# Patient Record
Sex: Female | Born: 1975 | Race: White | Hispanic: No | Smoking: Current every day smoker
Health system: Southern US, Community
[De-identification: ages and names within clinical notes are randomized; demographics above are authoritative.]

## PROBLEM LIST (undated history)

## (undated) ENCOUNTER — Emergency Department (HOSPITAL_BASED_OUTPATIENT_CLINIC_OR_DEPARTMENT_OTHER): Admission: EM | Payer: Medicare Other | Source: Home / Self Care

## (undated) DIAGNOSIS — M869 Osteomyelitis, unspecified: Secondary | ICD-10-CM

## (undated) DIAGNOSIS — F319 Bipolar disorder, unspecified: Secondary | ICD-10-CM

## (undated) DIAGNOSIS — H5440 Blindness, one eye, unspecified eye: Secondary | ICD-10-CM

## (undated) DIAGNOSIS — B182 Chronic viral hepatitis C: Secondary | ICD-10-CM

## (undated) DIAGNOSIS — F1990 Other psychoactive substance use, unspecified, uncomplicated: Secondary | ICD-10-CM

## (undated) DIAGNOSIS — F329 Major depressive disorder, single episode, unspecified: Secondary | ICD-10-CM

## (undated) DIAGNOSIS — R569 Unspecified convulsions: Secondary | ICD-10-CM

## (undated) DIAGNOSIS — D649 Anemia, unspecified: Secondary | ICD-10-CM

## (undated) DIAGNOSIS — I509 Heart failure, unspecified: Secondary | ICD-10-CM

## (undated) DIAGNOSIS — F32A Depression, unspecified: Secondary | ICD-10-CM

## (undated) DIAGNOSIS — M86661 Other chronic osteomyelitis, right tibia and fibula: Secondary | ICD-10-CM

## (undated) DIAGNOSIS — T8859XA Other complications of anesthesia, initial encounter: Secondary | ICD-10-CM

## (undated) DIAGNOSIS — T4145XA Adverse effect of unspecified anesthetic, initial encounter: Secondary | ICD-10-CM

## (undated) HISTORY — PX: FRACTURE SURGERY: SHX138

---

## 1998-12-12 ENCOUNTER — Encounter: Payer: Self-pay | Admitting: Emergency Medicine

## 1998-12-12 ENCOUNTER — Emergency Department (HOSPITAL_COMMUNITY): Admission: EM | Admit: 1998-12-12 | Discharge: 1998-12-12 | Payer: Self-pay | Admitting: Emergency Medicine

## 2000-08-11 ENCOUNTER — Encounter: Admission: RE | Admit: 2000-08-11 | Discharge: 2000-08-11 | Payer: Self-pay | Admitting: Family Medicine

## 2000-08-11 ENCOUNTER — Encounter: Payer: Self-pay | Admitting: Family Medicine

## 2000-10-22 ENCOUNTER — Encounter: Payer: Self-pay | Admitting: Family Medicine

## 2000-10-22 ENCOUNTER — Ambulatory Visit (HOSPITAL_COMMUNITY): Admission: RE | Admit: 2000-10-22 | Discharge: 2000-10-22 | Payer: Self-pay | Admitting: Family Medicine

## 2000-11-03 ENCOUNTER — Encounter (HOSPITAL_COMMUNITY): Admission: RE | Admit: 2000-11-03 | Discharge: 2000-12-03 | Payer: Self-pay | Admitting: *Deleted

## 2000-11-26 ENCOUNTER — Encounter: Admission: RE | Admit: 2000-11-26 | Discharge: 2000-11-26 | Payer: Self-pay | Admitting: Obstetrics

## 2000-12-13 ENCOUNTER — Inpatient Hospital Stay (HOSPITAL_COMMUNITY): Admission: AD | Admit: 2000-12-13 | Discharge: 2000-12-13 | Payer: Self-pay | Admitting: Obstetrics & Gynecology

## 2000-12-14 ENCOUNTER — Ambulatory Visit (HOSPITAL_COMMUNITY): Admission: RE | Admit: 2000-12-14 | Discharge: 2000-12-14 | Payer: Self-pay | Admitting: Obstetrics

## 2000-12-23 ENCOUNTER — Encounter: Admission: RE | Admit: 2000-12-23 | Discharge: 2000-12-23 | Payer: Self-pay | Admitting: Obstetrics & Gynecology

## 2000-12-23 ENCOUNTER — Inpatient Hospital Stay (HOSPITAL_COMMUNITY): Admission: AD | Admit: 2000-12-23 | Discharge: 2000-12-23 | Payer: Self-pay | Admitting: Obstetrics

## 2000-12-23 ENCOUNTER — Encounter: Payer: Self-pay | Admitting: Obstetrics

## 2000-12-25 ENCOUNTER — Inpatient Hospital Stay (HOSPITAL_COMMUNITY): Admission: AD | Admit: 2000-12-25 | Discharge: 2000-12-25 | Payer: Self-pay | Admitting: *Deleted

## 2000-12-25 ENCOUNTER — Ambulatory Visit (HOSPITAL_COMMUNITY): Admission: RE | Admit: 2000-12-25 | Discharge: 2000-12-25 | Payer: Self-pay | Admitting: *Deleted

## 2000-12-28 ENCOUNTER — Inpatient Hospital Stay (HOSPITAL_COMMUNITY): Admission: AD | Admit: 2000-12-28 | Discharge: 2000-12-28 | Payer: Self-pay | Admitting: *Deleted

## 2000-12-29 ENCOUNTER — Inpatient Hospital Stay (HOSPITAL_COMMUNITY): Admission: AD | Admit: 2000-12-29 | Discharge: 2001-01-01 | Payer: Self-pay | Admitting: *Deleted

## 2000-12-29 ENCOUNTER — Encounter (INDEPENDENT_AMBULATORY_CARE_PROVIDER_SITE_OTHER): Payer: Self-pay

## 2001-01-01 HISTORY — PX: TUBAL LIGATION: SHX77

## 2001-01-04 ENCOUNTER — Inpatient Hospital Stay (HOSPITAL_COMMUNITY): Admission: AD | Admit: 2001-01-04 | Discharge: 2001-01-04 | Payer: Self-pay | Admitting: Obstetrics

## 2002-05-10 ENCOUNTER — Encounter: Payer: Self-pay | Admitting: Emergency Medicine

## 2002-05-10 ENCOUNTER — Emergency Department (HOSPITAL_COMMUNITY): Admission: EM | Admit: 2002-05-10 | Discharge: 2002-05-11 | Payer: Self-pay | Admitting: Emergency Medicine

## 2002-12-21 ENCOUNTER — Emergency Department (HOSPITAL_COMMUNITY): Admission: EM | Admit: 2002-12-21 | Discharge: 2002-12-21 | Payer: Self-pay | Admitting: Emergency Medicine

## 2003-02-09 ENCOUNTER — Encounter: Payer: Self-pay | Admitting: *Deleted

## 2003-02-09 ENCOUNTER — Emergency Department (HOSPITAL_COMMUNITY): Admission: EM | Admit: 2003-02-09 | Discharge: 2003-02-09 | Payer: Self-pay | Admitting: *Deleted

## 2003-05-02 ENCOUNTER — Inpatient Hospital Stay (HOSPITAL_COMMUNITY): Admission: EM | Admit: 2003-05-02 | Discharge: 2003-05-03 | Payer: Self-pay

## 2003-06-16 ENCOUNTER — Inpatient Hospital Stay (HOSPITAL_COMMUNITY): Admission: EM | Admit: 2003-06-16 | Discharge: 2003-07-06 | Payer: Self-pay | Admitting: Emergency Medicine

## 2003-06-18 HISTORY — PX: OTHER SURGICAL HISTORY: SHX169

## 2003-06-20 ENCOUNTER — Encounter (INDEPENDENT_AMBULATORY_CARE_PROVIDER_SITE_OTHER): Payer: Self-pay | Admitting: Cardiology

## 2003-06-29 ENCOUNTER — Encounter: Payer: Self-pay | Admitting: Internal Medicine

## 2004-07-02 ENCOUNTER — Ambulatory Visit: Payer: Self-pay | Admitting: Gastroenterology

## 2004-07-12 ENCOUNTER — Ambulatory Visit (HOSPITAL_COMMUNITY): Admission: RE | Admit: 2004-07-12 | Discharge: 2004-07-12 | Payer: Self-pay | Admitting: Gastroenterology

## 2004-07-12 ENCOUNTER — Encounter (INDEPENDENT_AMBULATORY_CARE_PROVIDER_SITE_OTHER): Payer: Self-pay | Admitting: Specialist

## 2004-09-30 ENCOUNTER — Ambulatory Visit: Payer: Self-pay | Admitting: Internal Medicine

## 2004-10-14 ENCOUNTER — Ambulatory Visit: Payer: Self-pay | Admitting: Gastroenterology

## 2004-10-31 ENCOUNTER — Ambulatory Visit: Payer: Self-pay | Admitting: Gastroenterology

## 2004-11-14 ENCOUNTER — Ambulatory Visit: Payer: Self-pay | Admitting: Internal Medicine

## 2004-11-28 ENCOUNTER — Ambulatory Visit: Payer: Self-pay | Admitting: Internal Medicine

## 2004-12-12 ENCOUNTER — Ambulatory Visit: Payer: Self-pay | Admitting: Internal Medicine

## 2005-01-09 ENCOUNTER — Ambulatory Visit: Payer: Self-pay | Admitting: Internal Medicine

## 2005-02-06 ENCOUNTER — Ambulatory Visit: Payer: Self-pay | Admitting: Gastroenterology

## 2005-03-06 ENCOUNTER — Encounter: Admission: RE | Admit: 2005-03-06 | Discharge: 2005-03-06 | Payer: Self-pay | Admitting: Obstetrics and Gynecology

## 2005-03-06 ENCOUNTER — Ambulatory Visit: Payer: Self-pay | Admitting: Gastroenterology

## 2005-03-13 ENCOUNTER — Ambulatory Visit: Payer: Self-pay | Admitting: Gastroenterology

## 2005-03-16 ENCOUNTER — Emergency Department: Payer: Self-pay | Admitting: Emergency Medicine

## 2005-04-10 ENCOUNTER — Ambulatory Visit: Payer: Self-pay | Admitting: Gastroenterology

## 2005-05-08 ENCOUNTER — Ambulatory Visit: Payer: Self-pay | Admitting: Gastroenterology

## 2005-06-19 ENCOUNTER — Ambulatory Visit: Payer: Self-pay | Admitting: Gastroenterology

## 2005-08-01 ENCOUNTER — Encounter: Admission: RE | Admit: 2005-08-01 | Discharge: 2005-08-01 | Payer: Self-pay | Admitting: Gastroenterology

## 2005-08-01 ENCOUNTER — Ambulatory Visit: Payer: Self-pay | Admitting: Gastroenterology

## 2005-08-28 ENCOUNTER — Ambulatory Visit: Payer: Self-pay | Admitting: Gastroenterology

## 2005-10-30 ENCOUNTER — Ambulatory Visit: Payer: Self-pay | Admitting: Gastroenterology

## 2006-03-05 ENCOUNTER — Ambulatory Visit: Payer: Self-pay | Admitting: Gastroenterology

## 2008-06-29 ENCOUNTER — Emergency Department (HOSPITAL_COMMUNITY): Admission: EM | Admit: 2008-06-29 | Discharge: 2008-06-29 | Payer: Self-pay | Admitting: Emergency Medicine

## 2008-10-22 ENCOUNTER — Emergency Department (HOSPITAL_BASED_OUTPATIENT_CLINIC_OR_DEPARTMENT_OTHER): Admission: EM | Admit: 2008-10-22 | Discharge: 2008-10-22 | Payer: Self-pay | Admitting: Emergency Medicine

## 2008-10-22 ENCOUNTER — Ambulatory Visit: Payer: Self-pay | Admitting: Diagnostic Radiology

## 2010-02-19 ENCOUNTER — Ambulatory Visit (HOSPITAL_COMMUNITY): Admission: RE | Admit: 2010-02-19 | Discharge: 2010-02-19 | Payer: Self-pay | Admitting: Psychiatry

## 2010-02-20 ENCOUNTER — Other Ambulatory Visit (HOSPITAL_COMMUNITY): Admission: RE | Admit: 2010-02-20 | Discharge: 2010-05-01 | Payer: Self-pay | Admitting: Psychiatry

## 2010-02-22 ENCOUNTER — Ambulatory Visit: Payer: Self-pay | Admitting: Psychiatry

## 2010-05-09 ENCOUNTER — Ambulatory Visit (HOSPITAL_COMMUNITY): Payer: Self-pay | Admitting: Psychology

## 2010-09-05 ENCOUNTER — Inpatient Hospital Stay (HOSPITAL_COMMUNITY)
Admission: RE | Admit: 2010-09-05 | Discharge: 2010-09-09 | DRG: 897 | Disposition: A | Discharge status: Home / Self Care | Payer: Medicare Other | Source: Ambulatory Visit | Attending: Psychiatry | Admitting: Psychiatry

## 2010-09-05 ENCOUNTER — Emergency Department (HOSPITAL_COMMUNITY)
Admission: EM | Admit: 2010-09-05 | Discharge: 2010-09-05 | Disposition: A | Discharge status: Other Acute Inpatient Hospital | Payer: Medicare Other | Source: Home / Self Care | Attending: Emergency Medicine | Admitting: Emergency Medicine

## 2010-09-05 DIAGNOSIS — Z9119 Patient's noncompliance with other medical treatment and regimen: Secondary | ICD-10-CM

## 2010-09-05 DIAGNOSIS — Z8619 Personal history of other infectious and parasitic diseases: Secondary | ICD-10-CM | POA: Insufficient documentation

## 2010-09-05 DIAGNOSIS — F319 Bipolar disorder, unspecified: Secondary | ICD-10-CM

## 2010-09-05 DIAGNOSIS — L659 Nonscarring hair loss, unspecified: Secondary | ICD-10-CM | POA: Insufficient documentation

## 2010-09-05 DIAGNOSIS — B1920 Unspecified viral hepatitis C without hepatic coma: Secondary | ICD-10-CM

## 2010-09-05 DIAGNOSIS — F39 Unspecified mood [affective] disorder: Secondary | ICD-10-CM

## 2010-09-05 DIAGNOSIS — Z23 Encounter for immunization: Secondary | ICD-10-CM

## 2010-09-05 DIAGNOSIS — R45851 Suicidal ideations: Secondary | ICD-10-CM

## 2010-09-05 DIAGNOSIS — F1994 Other psychoactive substance use, unspecified with psychoactive substance-induced mood disorder: Secondary | ICD-10-CM

## 2010-09-05 DIAGNOSIS — Z91199 Patient's noncompliance with other medical treatment and regimen due to unspecified reason: Secondary | ICD-10-CM

## 2010-09-05 DIAGNOSIS — F411 Generalized anxiety disorder: Secondary | ICD-10-CM | POA: Insufficient documentation

## 2010-09-05 DIAGNOSIS — L905 Scar conditions and fibrosis of skin: Secondary | ICD-10-CM | POA: Insufficient documentation

## 2010-09-05 DIAGNOSIS — Z560 Unemployment, unspecified: Secondary | ICD-10-CM

## 2010-09-05 DIAGNOSIS — D649 Anemia, unspecified: Secondary | ICD-10-CM

## 2010-09-05 DIAGNOSIS — F1920 Other psychoactive substance dependence, uncomplicated: Principal | ICD-10-CM

## 2010-09-05 DIAGNOSIS — Z79899 Other long term (current) drug therapy: Secondary | ICD-10-CM | POA: Insufficient documentation

## 2010-09-05 LAB — DIFFERENTIAL
Basophils Absolute: 0 10*3/uL (ref 0.0–0.1)
Basophils Relative: 0 % (ref 0–1)
Eosinophils Absolute: 0.1 10*3/uL (ref 0.0–0.7)
Eosinophils Relative: 1 % (ref 0–5)
Lymphocytes Relative: 16 % (ref 12–46)
Lymphs Abs: 1.5 10*3/uL (ref 0.7–4.0)
Monocytes Absolute: 0.7 10*3/uL (ref 0.1–1.0)
Monocytes Relative: 7 % (ref 3–12)
Neutro Abs: 7.3 10*3/uL (ref 1.7–7.7)
Neutrophils Relative %: 76 % (ref 43–77)

## 2010-09-05 LAB — COMPREHENSIVE METABOLIC PANEL
ALT: 21 U/L (ref 0–35)
AST: 25 U/L (ref 0–37)
Albumin: 3.6 g/dL (ref 3.5–5.2)
Alkaline Phosphatase: 68 U/L (ref 39–117)
BUN: 8 mg/dL (ref 6–23)
CO2: 26 mEq/L (ref 19–32)
Calcium: 8.5 mg/dL (ref 8.4–10.5)
Chloride: 100 mEq/L (ref 96–112)
Creatinine, Ser: 0.69 mg/dL (ref 0.4–1.2)
GFR calc Af Amer: >60 mL/min (ref >60)
GFR calc non Af Amer: >60 mL/min (ref >60)
Glucose, Bld: 78 mg/dL (ref 70–99)
Potassium: 3 mEq/L — ABNORMAL LOW (ref 3.5–5.1)
Sodium: 133 mEq/L — ABNORMAL LOW (ref 135–145)
Total Bilirubin: 0.5 mg/dL (ref 0.3–1.2)
Total Protein: 7.5 g/dL (ref 6.0–8.3)

## 2010-09-05 LAB — CBC
HCT: 30.5 % — ABNORMAL LOW (ref 36.0–46.0)
Hemoglobin: 9.1 g/dL — ABNORMAL LOW (ref 12.0–15.0)
MCH: 20.7 pg — ABNORMAL LOW (ref 26.0–34.0)
MCHC: 29.8 g/dL — ABNORMAL LOW (ref 30.0–36.0)
MCV: 69.5 fL — ABNORMAL LOW (ref 78.0–100.0)
Platelets: 263 10*3/uL (ref 150–400)
RBC: 4.39 MIL/uL (ref 3.87–5.11)
RDW: 18.7 % — ABNORMAL HIGH (ref 11.5–15.5)
WBC: 9.6 10*3/uL (ref 4.0–10.5)

## 2010-09-05 LAB — RAPID URINE DRUG SCREEN, HOSP PERFORMED
Amphetamines: NOT DETECTED
Barbiturates: NOT DETECTED
Benzodiazepines: POSITIVE — AB
Cocaine: POSITIVE — AB
Opiates: POSITIVE — AB
Tetrahydrocannabinol: NOT DETECTED

## 2010-09-05 LAB — POCT PREGNANCY, URINE: Preg Test, Ur: NEGATIVE

## 2010-09-05 LAB — ETHANOL: Alcohol, Ethyl (B): <5 mg/dL (ref 0–10)

## 2010-09-06 DIAGNOSIS — F1910 Other psychoactive substance abuse, uncomplicated: Secondary | ICD-10-CM

## 2010-09-07 LAB — RPR: RPR Ser Ql: NONREACTIVE

## 2010-09-07 LAB — VITAMIN B12: Vitamin B-12: 290 pg/mL (ref 211–911)

## 2010-09-07 LAB — TSH: TSH: 0.224 u[IU]/mL — ABNORMAL LOW (ref 0.350–4.500)

## 2010-09-09 LAB — FOLATE RBC: RBC Folate: 1091 ng/mL — ABNORMAL HIGH (ref 180–600)

## 2010-09-11 LAB — DRUGS OF ABUSE SCREEN W/O ALC, ROUTINE URINE
Amphetamine Screen, Ur: NEGATIVE
Barbiturate Quant, Ur: NEGATIVE
Benzodiazepines.: NEGATIVE
Cocaine Metabolites: NEGATIVE
Creatinine,U: 64.5 mg/dL
Marijuana Metabolite: NEGATIVE
Methadone: NEGATIVE
Opiate Screen, Urine: NEGATIVE
Phencyclidine (PCP): NEGATIVE
Propoxyphene: NEGATIVE

## 2010-09-11 NOTE — H&P (Signed)
NAME:  Mary Long, [name]:  [id number]  MEDICAL RECORD NO.:  [id number]           PATIENT TYPE:  I  LOCATION:  0304                          [facility]ACILITY[facility]  [facility]  PHYSICIAN:  Anselm Jungling, MD  DATE OF BIRTH:  06/14/1976  DATE OF ADMISSION:  09/05/2010 DATE OF DISCHARGE:                      PSYCHIATRIC ADMISSION ASSESSMENT   This is a 35 year old female who was voluntarily admitted on September 05, 2010. The patient presented to the emergency department from Dr. Dub Mikes, having suicidal thoughts and using heroin intravenously and cocaine.  Has been off her medications for some period of time.  She is asking to be placed on Suboxone.  She denies any psychotic symptoms.  PAST PSYCHIATRIC HISTORY:  The patient was last here in August 2011 for chronic heroin and cocaine dependence,with a long history of bipolar disorder.  MEDICATIONS:  The patient lists medications of Ambien and Geodon, again of which she has been noncompliant with. She has been on BuSpar and lithium in the past.  She sees Dr. Dub Mikes for outpatient mental health therapy.  SOCIAL HISTORY:  The patient lives with her mother and her 37 year old daughter.  She is on disability. The patient is a widowed.  FAMILY HISTORY:  None.  ALCOHOL/DRUG HISTORY:  The patient has been using intravenously.  Denies any alcohol use.  Reported use of heroin and cocaine.  PRIMARY CARE PHYSICIAN:  None.  DRUG ALLERGIES:  None.  PHYSICAL EXAMINATION:  The physical exam was done at [facility] emergency department.  She is very disheveled.  She has some scabs present on her chin and nose.  She states that she has a fall. She seems uncomfortable but denies any physical complaints.  She reports feeling exhausted.  ER records with her physical note that the patient is has pea-sized patch of alopecia on her scalp with scabs and has multiple well-healing scars, cut marks throughout her arms bilaterally.  Her right and left  pupil are noted to be pinpoint.  LABORATORY DATA:  Hemoglobin is 9, hematocrit of 30, MCV 69.5, MCH is 20.7.  Urine pregnancy test is negative.  Sodium 133, potassium of 3. Alcohol level less than 5.  Urine drug screen positive for opiates, positive for cocaine, positive for benzodiazepine.  MENTAL STATUS EXAM:  The patient is in bed.  She is very disheveled. Poor eye contact.  Skin scabs are noted to her chin and to her nose. Her speech is somewhat slurred and difficult to understand at times. She seems irritable.  She states that she is exhausted.  Thought process is are coherent, goal-directed.  No evidence of any psychotic symptoms, irritable when offered that she could have the clonidine protocol, that it will not work asking for Suboxone. Cognitive functioning:  She seems aware of herself and place. Her judgment and insight are poor.  DIAGNOSES:  AXIS I:  Polysubstance dependence.  Substance-induced mood disorder. AXIS II:  Deferred. AXIS III:  Anemia AXIS IV:  Other psychosocial problems related to chronic substance use. AXIS V:  Current is 30.  PLAN:  Our plan is to offer the clonidine protocol for comfort measures. Will continue to assess her  comorbidities.  We will have her Seroquel available for agitation and Ativan for anxiety.  We will assess her motivation for rehab and identify her support group.  Her tentative length of stay at this time is 2-4 days.     Landry Corporal, N.P.   ______________________________ Anselm Jungling, MD    JO/MEDQ  D:  09/06/2010  T:  09/07/2010  Job:  [id number]  Electronically Signed by Limmie PatriciaP. on 09/09/2010 02:05:34 PM Electronically Signed by Geralyn Flash MD on 09/11/2010 12:56:32 PM

## 2010-09-16 NOTE — Discharge Summary (Signed)
  NAME:  [name], [name] NO.:  [id number]  MEDICAL RECORD NO.:  [id number]           PATIENT TYPE:  I  LOCATION:  0304                          [facility]ACILITY[facility]  [facility]  PHYSICIAN:  Anselm Jungling, MD  DATE OF BIRTH:  06-06-1976  DATE OF ADMISSION:  09/05/2010 DATE OF DISCHARGE:  09/09/2010                              DISCHARGE SUMMARY   IDENTIFYING DATA AND REASON FOR ADMISSION:  This was an inpatient psychiatric admission for Mary Long, a 35 year old, single, Caucasian female who was admitted because of polysubstance abuse and dependence. Also, she came to [place] with a history of mood disorder and had been off of her medications for approximately 2 weeks.  Please refer to the admission note for further details pertaining to the symptoms, circumstances, and history that led to her hospitalization.  She was given initial axis I diagnosis of polysubstance dependence and substance- induced mood disorder.  MEDICAL AND LABORATORY:  The patient was medically and physically assessed by the psychiatric nurse practitioner.  She was in good health without any active or chronic medical problems.  There were no significant medical issues during her stay.  HOSPITAL COURSE:  The patient was admitted to the Adult Inpatient Psychiatric Service.  She presented as a well-nourished, normally- developed, adult female who was generally pleasant and cooperative.  She readily acknowledged her loss of control of her substance abuse.  She acknowledged having a lot of legal problems related to her substance abuse as well.  She participated in therapeutic groups and activities geared towards helping her acquire better coping skills, a better understanding of her underlying disorders and dynamics, and the development of a sound aftercare plan.  She also participated in 12-step recovery groups.  She was treated with a psychotropic regimen of medications that she had previously found helpful  including Geodon, BuSpar, and Strattera.  She was instructed to stop taking lithium, Abilify, and Ambien.  She was appropriate for discharge on the fifth hospital day.  She agreed to the following aftercare plan.  AFTERCARE:  The patient was to follow up at the [facility] on February 28 at 11:00 a.m. and with Dr. Dub Mikes on March 1 at 10:40 a.m. She was also given information for getting to the [facility] for any medical followup that she might need.  DISCHARGE MEDICATIONS: 1. Atomoxetine 10 mg 3 capsules by mouth daily. 2. BuSpar 10 mg b.i.d. 3. Geodon 40 mg q.h.s.  DISCHARGE DIAGNOSES:  AXIS I:  Polysubstance dependence, early remission and mood disorder not otherwise specified. AXIS II:  Deferred. AXIS III:  No acute or chronic illnesses. AXIS IV:  Stressors severe. AXIS V:  GAF on discharge 50.     Anselm Jungling, MD     SPB/MEDQ  D:  09/12/2010  T:  09/12/2010  Job:  [id number]  Electronically Signed by Geralyn Flash MD on 09/16/2010 11:04:10 AM

## 2010-09-26 LAB — URINE DRUGS OF ABUSE SCREEN W ALC, ROUTINE (REF LAB)
Amphetamine Screen, Ur: NEGATIVE
Amphetamine Screen, Ur: NEGATIVE
Amphetamine Screen, Ur: NEGATIVE
Barbiturate Quant, Ur: NEGATIVE
Barbiturate Quant, Ur: NEGATIVE
Barbiturate Quant, Ur: NEGATIVE
Barbiturate Quant, Ur: NEGATIVE
Barbiturate Quant, Ur: NEGATIVE
Barbiturate Quant, Ur: NEGATIVE
Barbiturate Quant, Ur: NEGATIVE
Benzodiazepines.: NEGATIVE
Benzodiazepines.: NEGATIVE
Benzodiazepines.: NEGATIVE
Benzodiazepines.: NEGATIVE
Benzodiazepines.: NEGATIVE
Benzodiazepines.: NEGATIVE
Benzodiazepines.: NEGATIVE
Cocaine Metabolites: NEGATIVE
Cocaine Metabolites: NEGATIVE
Cocaine Metabolites: NEGATIVE
Cocaine Metabolites: NEGATIVE
Cocaine Metabolites: NEGATIVE
Cocaine Metabolites: NEGATIVE
Cocaine Metabolites: NEGATIVE
Creatinine,U: 118.9 mg/dL
Creatinine,U: 98.3 mg/dL
Creatinine,U: 78.9 mg/dL
Creatinine,U: 91.2 mg/dL
Creatinine,U: 91.1 mg/dL
Ethyl Alcohol: <10 mg/dL (ref <10)
Ethyl Alcohol: <10 mg/dL (ref <10)
Ethyl Alcohol: <10 mg/dL (ref <10)
Ethyl Alcohol: <10 mg/dL (ref <10)
Ethyl Alcohol: <10 mg/dL (ref <10)
Ethyl Alcohol: <10 mg/dL (ref <10)
Ethyl Alcohol: <10 mg/dL (ref <10)
Marijuana Metabolite: NEGATIVE
Marijuana Metabolite: NEGATIVE
Marijuana Metabolite: NEGATIVE
Methadone: NEGATIVE
Methadone: NEGATIVE
Methadone: NEGATIVE
Methadone: NEGATIVE
Opiate Screen, Urine: NEGATIVE
Opiate Screen, Urine: NEGATIVE
Opiate Screen, Urine: NEGATIVE
Opiate Screen, Urine: NEGATIVE
Phencyclidine (PCP): NEGATIVE
Phencyclidine (PCP): NEGATIVE
Phencyclidine (PCP): NEGATIVE
Phencyclidine (PCP): NEGATIVE
Phencyclidine (PCP): NEGATIVE
Phencyclidine (PCP): NEGATIVE
Phencyclidine (PCP): NEGATIVE
Propoxyphene: NEGATIVE
Propoxyphene: NEGATIVE
Propoxyphene: NEGATIVE

## 2010-09-27 LAB — URINE DRUGS OF ABUSE SCREEN W ALC, ROUTINE (REF LAB)
Amphetamine Screen, Ur: NEGATIVE
Barbiturate Quant, Ur: NEGATIVE
Benzodiazepines.: NEGATIVE
Benzodiazepines.: NEGATIVE
Marijuana Metabolite: NEGATIVE
Marijuana Metabolite: NEGATIVE
Methadone: NEGATIVE
Phencyclidine (PCP): NEGATIVE
Propoxyphene: NEGATIVE
Propoxyphene: NEGATIVE
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
## 2010-11-29 ENCOUNTER — Encounter (HOSPITAL_COMMUNITY): Payer: Self-pay

## 2010-11-29 ENCOUNTER — Emergency Department (HOSPITAL_COMMUNITY): Payer: Medicare Other

## 2010-11-29 ENCOUNTER — Inpatient Hospital Stay (HOSPITAL_COMMUNITY)
Admission: EM | Admit: 2010-11-29 | Discharge: 2010-11-30 | DRG: 958 | Disposition: A | Discharge status: Skilled Nursing Facility | Payer: Medicare Other | Attending: Orthopaedic Surgery | Admitting: Orthopaedic Surgery

## 2010-11-29 DIAGNOSIS — S32509A Unspecified fracture of unspecified pubis, initial encounter for closed fracture: Secondary | ICD-10-CM | POA: Diagnosis present

## 2010-11-29 DIAGNOSIS — S92309A Fracture of unspecified metatarsal bone(s), unspecified foot, initial encounter for closed fracture: Secondary | ICD-10-CM | POA: Diagnosis present

## 2010-11-29 DIAGNOSIS — F4310 Post-traumatic stress disorder, unspecified: Secondary | ICD-10-CM | POA: Diagnosis present

## 2010-11-29 DIAGNOSIS — R000 Tachycardia, unspecified: Secondary | ICD-10-CM | POA: Diagnosis present

## 2010-11-29 DIAGNOSIS — E870 Hyperosmolality and hypernatremia: Secondary | ICD-10-CM | POA: Diagnosis present

## 2010-11-29 DIAGNOSIS — D62 Acute posthemorrhagic anemia: Secondary | ICD-10-CM | POA: Diagnosis present

## 2010-11-29 DIAGNOSIS — D72829 Elevated white blood cell count, unspecified: Secondary | ICD-10-CM | POA: Diagnosis present

## 2010-11-29 DIAGNOSIS — F319 Bipolar disorder, unspecified: Secondary | ICD-10-CM | POA: Diagnosis present

## 2010-11-29 DIAGNOSIS — S82899A Other fracture of unspecified lower leg, initial encounter for closed fracture: Principal | ICD-10-CM | POA: Diagnosis present

## 2010-11-29 DIAGNOSIS — F17200 Nicotine dependence, unspecified, uncomplicated: Secondary | ICD-10-CM | POA: Diagnosis present

## 2010-11-29 DIAGNOSIS — Z79899 Other long term (current) drug therapy: Secondary | ICD-10-CM

## 2010-11-29 DIAGNOSIS — S8263XA Displaced fracture of lateral malleolus of unspecified fibula, initial encounter for closed fracture: Secondary | ICD-10-CM | POA: Diagnosis present

## 2010-11-29 DIAGNOSIS — IMO0002 Reserved for concepts with insufficient information to code with codable children: Secondary | ICD-10-CM | POA: Diagnosis present

## 2010-11-29 DIAGNOSIS — S92919A Unspecified fracture of unspecified toe(s), initial encounter for closed fracture: Secondary | ICD-10-CM | POA: Diagnosis present

## 2010-11-29 DIAGNOSIS — S27329A Contusion of lung, unspecified, initial encounter: Secondary | ICD-10-CM | POA: Diagnosis present

## 2010-11-29 DIAGNOSIS — B1920 Unspecified viral hepatitis C without hepatic coma: Secondary | ICD-10-CM | POA: Diagnosis present

## 2010-11-29 DIAGNOSIS — F1010 Alcohol abuse, uncomplicated: Secondary | ICD-10-CM | POA: Diagnosis present

## 2010-11-29 LAB — POCT I-STAT, CHEM 8
BUN: 10 mg/dL (ref 6–23)
Calcium, Ion: 1.06 mmol/L — ABNORMAL LOW (ref 1.12–1.32)
HCT: 37 % (ref 36.0–46.0)
Hemoglobin: 12.6 g/dL (ref 12.0–15.0)
TCO2: 21 mmol/L (ref 0–100)

## 2010-11-29 LAB — CBC
MCV: 74.4 fL — ABNORMAL LOW (ref 78.0–100.0)
Platelets: 372 10*3/uL (ref 150–400)
RBC: 4.69 MIL/uL (ref 3.87–5.11)
WBC: 20.3 10*3/uL — ABNORMAL HIGH (ref 4.0–10.5)

## 2010-11-29 LAB — DIFFERENTIAL
Basophils Absolute: 0 10*3/uL (ref 0.0–0.1)
Basophils Relative: 0 % (ref 0–1)
Myelocytes: 0 %
Neutro Abs: 15.9 10*3/uL — ABNORMAL HIGH (ref 1.7–7.7)
Neutrophils Relative %: 78 % — ABNORMAL HIGH (ref 43–77)
Promyelocytes Absolute: 0 %

## 2010-11-29 LAB — ETHANOL: Alcohol, Ethyl (B): 128 mg/dL — ABNORMAL HIGH (ref 0–10)

## 2010-11-29 LAB — RAPID URINE DRUG SCREEN, HOSP PERFORMED
Amphetamines: NOT DETECTED
Benzodiazepines: NOT DETECTED
Cocaine: NOT DETECTED
Opiates: NOT DETECTED
Tetrahydrocannabinol: NOT DETECTED

## 2010-11-29 LAB — URINALYSIS, ROUTINE W REFLEX MICROSCOPIC
Bilirubin Urine: NEGATIVE
Nitrite: NEGATIVE
Protein, ur: 30 mg/dL — AB
Specific Gravity, Urine: 1.027 (ref 1.005–1.030)
Urobilinogen, UA: 0.2 mg/dL (ref 0.0–1.0)

## 2010-11-29 LAB — URINE MICROSCOPIC-ADD ON

## 2010-11-29 LAB — LITHIUM LEVEL: Lithium Lvl: <0.25 mEq/L — ABNORMAL LOW (ref 0.80–1.40)

## 2010-11-29 LAB — SAMPLE TO BLOOD BANK

## 2010-11-29 NOTE — H&P (Signed)
NAMEDENIM, START                      ACCOUNT NO.:  [id number]   MEDICAL RECORD NO.:  [id number]                   PATIENT TYPE:  INP   LOCATION:  0102                                 FACILITY:  [facility]   PHYSICIAN:  Deirdre Peer. Polite, M.D.              DATE OF BIRTH:  1976/01/28   DATE OF ADMISSION:  06/16/2003  DATE OF DISCHARGE:                                HISTORY & PHYSICAL   CHIEF COMPLAINT:  I don't feel well.   HISTORY OF PRESENT ILLNESS:  [age]-year-old white female with history  of IV drug abuse, who presents to the ED with the above chief complaint.  In  the ED, the patient is found to be febrile with leukocytosis.  Because of  her history of IV drug abuse issues, medicine was called for evaluation.  The patient was alert and oriented, but in moderate distress secondary to  right upper extremity pain predominantly in the forearm and wrist.  She  states that she has had swelling in her hand for weeks, denied any  discharge, does admit to fever and chills, no nausea and vomiting.  The  patient does admit to IV drug use of heroin and cocaine, however, denies any  injection into the right extremity secondary to pain and discomfort.  The  patient does admit that she uses clean needles always and does not share  needles.  The patient also states that she has had an HIV test,  approximately four weeks ago, and it was negative.  Because of the above,  admission is warranted to rule out possible endocarditis in an IV drug  abuser.   PAST MEDICAL HISTORY:  1. As stated above, IV drug abuse; skin wounds, multiple, in all four     extremities; abscess cannot be excluded.  2. Fever.  3. Narcotic dependence.  4. Tobacco abuse.   MEDICATIONS:  None.   SOCIAL HISTORY:  Social history significant for chronic cocaine and heroin  use via injection, positive tobacco -- one pack per day.  The patient denies  any alcohol.   PAST SURGICAL HISTORY:  The patient denies  appendectomy, cholecystectomy or  hysterectomy.   ALLERGIES:  The patient denies any drug allergies.   FAMILY HISTORY:  Noncontributory.   REVIEW OF SYSTEMS:  Review of systems as stated in HPI.  In addition, the  patient denies any chest pain or shortness of breath; no nausea, no  vomiting, no change in stool, no dysuria.  The patient does admit to  significant pain in her right upper extremity, predominantly along the  forearm and her wrist.   PHYSICAL EXAM:  VITAL SIGNS:  Temperature 100.5, BP 120/64, pulse of 111,  respiratory rate of 18.  GENERAL:  The patient is somewhat cachectic and in moderate distress  secondary to pain in the right upper extremity.  HEENT:  Pupils were equal, round, reactive to light, anicteric.  The patient  has a local  scar on her nose without discharge.  There were no oral lesions.  The patient did have enlarged tonsils without signs of infection.  NECK:  There was no thyromegaly.  CHEST:  Chest clear to auscultation bilaterally.  No rales, no wheezes.  CARDIOVASCULAR:  Regular, S1 and S2.  No S3.  No significant murmur was  appreciated.  ABDOMEN:  Abdomen was soft and nontender.  No hepatosplenomegaly.  EXTREMITIES:  Extremities had several track marks on all four extremities.  Of note, the right upper extremity with erythema, swelling at the forearm,  specifically at the wrist.  There was associated fullness but no definitive  abscess and no discharge could be expressed.  Pulses 2+.  Motor exam of that  extremity within normal limits.  The patient did have shotty painful nodes  in the axillary area of that arm.  NEUROLOGIC:  The patient is slightly lethargic, otherwise, nonfocal.  RECTAL:  Exam was deferred.   DATA:  The patient had a UA with specific gravity of 1.034, trace ketones,  30 protein, nitrite negative, leukocyte esterase moderate, 11 to 20 white  blood cells, a few bacteria, oxalate crystals negative.  Urine pregnancy  test negative.   Urine drug screen positive for opiates and cocaine.  CBC  significant for a white count of 14.6, hemoglobin of 10.2, MCV of 76.9,  platelets 420,000, neutrophil count of 72%.  BMET:  Sodium 131, potassium  2.9, chloride 99, carbon dioxide 27, glucose 100, BUN 14, creatinine 0.7,  calcium 8.7.   ASSESSMENT AND PLAN:  1. Rule out endocarditis in an intravenous drug abuser who presents to the     emergency department with fever, multiple skin wounds, and abscess cannot     be excluded in the right forearm.  The patient will be admitted to the     medicine floor, will be pancultured, intravenous antibiotics, vancomycin     and gentamicin, and further studies as indicated.  2. Multiple skin wounds secondary to intravenous drug injection.  Will have     localized wound care.  We will have the patient elevate her right arm.  3. Fever secondary to #2.  4. Narcotic dependence cocaine and opiates.  Will use Catapres, methadone     and Ativan to prevent withdrawal.  The patient denies any alcohol use,     however, we will continue multivitamin, thiamine and folate.  5. Hypokalemia.  We will replete via intravenous fluids.  6. Probable urinary tract infection per review of the urinalysis, which     should be adequately covered with the above antibiotics for possible     endocarditis.  7. Dehydration most likely secondary to poor oral (p.o.) intake.  Will     replete via intravenous fluids.  8. Will make further recommendations as deemed necessary.                                               Deirdre Peer. Polite, M.D.    RDP/MEDQ  D:  06/16/2003  T:  06/16/2003  Job:  [id number]

## 2010-11-29 NOTE — Op Note (Signed)
[facility]  Patient:    Mary Long, Mary Long                       MRN: [id number] Proc. Date: 01/01/01 Adm. Date:  [id number] Attending:  Michaelle Copas Dictator:   Jamey Reas, M.D.                           Operative Report  DATE OF BIRTH:                1975-11-02  PREOPERATIVE DIAGNOSIS:       Multiparity, desire for permanent sterilization.  POSTOPERATIVE DIAGNOSIS:      Multiparity, desire for permanent sterilization.  PROCEDURE:                    Postpartum bilateral tubal ligation, Pomeroy method.  SURGEON:                      Roseanna Rainbow, M.D.  ASSISTANT:                    Jamey Reas, M.D.  ANESTHESIA:                   LMA.  FLUIDS:                       See anesthesia note.  ESTIMATED BLOOD LOSS:         Minimal.  SPECIMENS:                    Bilateral portions of tubes.  COMPLICATIONS:                None.  CONDITION:                    Stable.  INDICATIONS:                  The patient is a 35 year old G3, P2-0-1-2 postpartum day #2 status post normal spontaneous vaginal delivery who desires permanent sterilization.  The risks and benefits of the procedure were discussed with the patient including the risk of failure in 3-11/998 with increased risk of ectopic gestation if pregnancy occurs.  FINDINGS:                     Normal uterus, tubes and ovaries.  DESCRIPTION OF PROCEDURE:     The patient was taken to the operating room, where an epidural never found to be adequate.  She was then placed under laryngeal mask airway for anesthesia. A  small transverse infraumbilical skin incision was then made with a scalpel.  The incision was then carried down through the underlying fascia until the peritoneum was entered.  The peritoneum was noted to be free of any adhesions.  The incision was then extended with Metzenbaum scissors.  The patients right fallopian tube was identified, brought  into the incision and grasped with a Babcock clamp.  The tube was then followed out to the fimbria.  A Babcock clamp was then used to grasp the tube approximately 4 cm from the cornual region.  A 3 cm segment of tube was then ligated with a free tie of plain gut and excised.  Good hemostasis was noted.  The tube was returned to the abdomen.  The left fallopian tube  was then ligated and a 3 cm segment excised in a similar fashion.  Excellent hemostasis was noted.  The tube was returned to the abdomen.  The peritoneum and fascia were closed in a single layers using 3-0 Vicryl suture.  The skin was reapproximated using 3-0 Vicryl in subcuticular fashion.  The patient tolerated the procedure well.  Sponge, lap, and needle counts were correct x 2.  The patient was taken to the recovery room in stable condition.  PATHOLOGY:                    Segments of right and left fallopian tubes. DD:  01/01/01 TD:  01/01/01 Job: [id number]
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
## 2010-11-29 NOTE — Op Note (Signed)
Long, Mary                      ACCOUNT NO.:  [id number]   MEDICAL RECORD NO.:  [id number]                   PATIENT TYPE:  INP   LOCATION:  0352                                 FACILITY:  [facility]   PHYSICIAN:  Mary Fitch. Naaman Long., M.D.          DATE OF BIRTH:  17-Oct-1975   DATE OF PROCEDURE:  06/18/2003  DATE OF DISCHARGE:                                 OPERATIVE REPORT   PREOPERATIVE DIAGNOSIS:  Multiple subcutaneous abscesses due to intravenous  substance abuse with probably septic thrombophlebitis, right volar and  radial wrist and volar forearm.   POSTOPERATIVE DIAGNOSIS:  Multiple subcutaneous abscesses due to intravenous  substance abuse with probably septic thrombophlebitis, right volar and  radial wrist and volar forearm.   OPERATION PERFORMED:  Incision and drainage of four abscesses with aerobic  and anaerobic culture, debridement of necrotic tissue and iodoform packing  of four abscess cavities, right volar and radial wrist.   SURGEON:  Mary Fitch. Sypher, M.D.   ASSISTANT:  Nurse.   ANESTHESIA:  General endotracheal.   SUPERVISING ANESTHESIOLOGIST:  Mary Long, M.D.   INDICATIONS FOR PROCEDURE:  Mary Long is a 35 year old chronic IV cocaine  and heroin abuser, who has had multiple admissions for prior substance abuse  related medical problems.  She was admitted to [facility] on June 16, 2003 to the hospitalist service for evaluation of  fever malaise and multiple abscesses from intravenous drug abuse.  She was  noted at time of admission to have a positive drug screen for cocaine.   She was noted to have frank cellulitis of her forearm and wrist region.  She  was pancultured by the hospitalists and has returned a positive blood  culture for gram positive cocci.  She is currently being treated with IV  vancomycin and gentamicin under the supervision of the hospitalist service.  An upper extremity orthopedic consult  was requested on June 17, 2003  after the general trauma surgery service had evaluated her arm and advised  that a hand surgery consult would be more appropriate.  She was noted to  have diffuse cellulitis on August 17, 2002. We recommended warm water soaks  to bring her purulence to a pointing stage.  On exam earlier this morning,  she was noted to have four abscesses forming at sites of previous  injections.  She is now brought to the operating room for incision and  drainage.  She has been n.p.o. since midnight on June 17, 2003.  After  informed consent during which her questions were invited and answered, she  was brought to the operating room at this time.  She understands that there  are risks of bleeding when debriding this type of abscesses as this could  represent a mycotic aneurysm of the radial artery or one of the large  caliber veins such as the cephalic vein.  She has had a full informed  consent by  Dr. Shireen Long prior to anesthesia with complications of anesthesia  including death being clearly explained to her.   DESCRIPTION OF PROCEDURE:  Mary Long was brought to the operating room  and placed in supine position on the operating table. Following induction of  general endotracheal anesthesia, the right arm was prepped with Betadine  soap and solution and sterilely draped.  Following exsanguination of the  limb by elevation, the arterial tourniquet on the proximal brachium was  inflated to 220 mmHg.  The procedure commenced with orderly debridement of  her abscesses.  The injection sites were ellipsed with the necrotic skin  removed.  Subcutaneous tissues were gently spread with blunt hemostats.  Frank purulent material was recovered from all four abscess cavities.  Three  of the four were undermining and joined beneath the skin.  These followed  the path of the radial artery and the cephalic vein.  The abscesses were  thoroughly irrigated with sterile saline  followed by triple antibiotic  solution.  All necrotic tissue was removed.  The abscesses were then packed  sequentially with iodoform gauze times four.  Tourniquet was released.  There was no arterial bleeding noted.  The wounds were then dressed with  Adaptic, sterile 4 x 4s, sterile Kerlix  and a compressive Ace wrap.  After  the initial 15 minutes of observation following release of tourniquet, there  was some bleed through due to venous bleeding.  This was controlled by  direct pressure and reapplication of a fresh bandage of sterile 4 x 4s,  Kerlix and Ace wrap.  There were otherwise no apparent complications. [name] tolerated surgery and anesthesia well.  The patient was then  transferred to the recovery room with stable vital signs.  She will be  returned to the service of the hospitalist to continue her IV vancomycin and  gentamicin.                                               Mary Long., M.D.    RVS/MEDQ  D:  06/18/2003  T:  06/19/2003  Job:  [id number]

## 2010-11-29 NOTE — Discharge Summary (Signed)
[name], Long                      ACCOUNT NO.:  [id number]   MEDICAL RECORD NO.:  [id number]                   PATIENT TYPE:  INP   LOCATION:  0352                                 FACILITY:  [facility]   PHYSICIAN:  Leonia Reeves, MD                 DATE OF BIRTH:  Apr 11, 1976   DATE OF ADMISSION:  06/16/2003  DATE OF DISCHARGE:  07/06/2003                                 DISCHARGE SUMMARY   PRIMARY CARE PHYSICIAN:  None listed.   DISCHARGE DIAGNOSES:  1. Right arm cellulitis and abscesses secondary to intravenous drug abuse,     (cocaine).  2. Right wrist septic thrombophlebitis.  3. Multiple skin wounds/abscesses secondary to intravenous drug use.  4. Methicillin resistant Staphylococcus aureus bacteremia.  5. Hepatitis C, type 1.  6. Urinary tract infection.  7. Narcotic dependence, (cocaine/opiates).  8. Acute dehydration.  9. Hypokalemia.   DISCHARGE MEDICATIONS:  1. Intravenous vancomycin for 7 more days.  2. Methadone 40 mg twice daily.  3. Tegretol 200 mg once daily.  4. Folic acid 1 mg daily.  5. Multivitamin one tablet daily.  6. Thiamine 100 mg daily.  7. Phenergan 12.5 mg three times daily as needed for nausea and vomiting.  8. Nicotine patch, 1 mg/24 hours.   SUMMARY:  The patient is a 35 year old white female with history of  intravenous drug abuse who came to the emergency department with a complaint  of I don't feel well.  In emergency department the patient was found to be  febrile and looked septic.  On physical examination the patient was alert  and oriented X3, she was in moderate to severe distress secondary to pain in  the right upper extremity predominantly in the forearm and wrist.  She was  noted to have had swelling in her hand for weeks and denied any noticeable  discharge from the swelling.  She admitted to intravenous drug use of heroin  and cocaine, however, denied recent injections into the effected parts.  The  patient admitted also  to be using needles but not sharing with others.  Temperature 100.5, blood pressure 120/64, pulse 111, respiratory rate 18.  The right upper extremity looked swollen with the wrist and forearm more  than the rest of the right upper extremity. Lungs were clear to auscultation  and percussion with no rales or rhonchi.  Cardiac examination regular rate  and rhythm, S1, S2 were normal.  There was no S3, no murmurs or rubs  appreciated.  Extremities significant for several track marks on all four  extremities secondary to drug injection.  Of note, the right upper extremity  has erythema and swelling at the forearm, specifically at the wrist.  There  was associated fullness but no definite abscess and there was no discharge  observed at that time.  There was no evidence of vascular compromise of the  effected extremity.   SIGNIFICANT LABORATORY DATA:  Urinalysis  significant for urinary tract  infection.  White blood cell count elevated at 14.6, hemoglobin 10.2, MCV  76.9, platelet count 420,000 with neutrophil count 72%.  Sodium 131,  potassium 2.9, chloride 99, cO2 27, glucose 100, BUN 14, creatinine 0.7.  Further laboratory data including blood culture significant for positive  methicillin resistant Staphylococcus aureus bacteremia.  HIV screening was  negative.  2-dimensional echocardiogram/TEE negative for endocarditis.   HOSPITAL COURSE:  The patient was admitted to the medical floor and started  on intravenous rehydration plus potassium chloride.  She was started on  intravenous Vancomycin, the plan of which was to continue for 28 days.  She  was also started on her home medications including methadone, Tegretol,  multivitamin, folic acid and Phenergan.  The patient was also seen by the  infectious disease team and the orthopedic/hand surgery team.  She responded  to the above regimen.   CONDITION ON DISCHARGE:  Stable.  Cellulitic abscesses and septic  thrombophlebitis are resolved.   Patient was well hydrated and her  electrolytes are also corrected.   DISPOSITION:  The patient is discharged to [facility].  She  will continue with intravenous vancomycin for 7 more days.  The rationale  for this discharge was discussed with the patient and she is agreeable.                                               Leonia Reeves, MD    VO/MEDQ  D:  07/06/2003  T:  07/06/2003  Job:  [id number]

## 2010-11-30 ENCOUNTER — Inpatient Hospital Stay: Admission: AC | Admit: 2010-11-30 | Payer: Self-pay | Source: Ambulatory Visit

## 2010-11-30 ENCOUNTER — Inpatient Hospital Stay (HOSPITAL_COMMUNITY): Payer: Medicare Other

## 2010-11-30 ENCOUNTER — Inpatient Hospital Stay (HOSPITAL_COMMUNITY)
Admission: AD | Admit: 2010-11-30 | Discharge: 2010-12-05 | Disposition: A | Discharge status: Skilled Nursing Facility | Payer: Medicare Other | Source: Other Acute Inpatient Hospital

## 2010-11-30 LAB — COMPREHENSIVE METABOLIC PANEL
Alkaline Phosphatase: 41 U/L (ref 39–117)
BUN: 7 mg/dL (ref 6–23)
CO2: 25 mEq/L (ref 19–32)
Chloride: 99 mEq/L (ref 96–112)
Creatinine, Ser: <0.47 mg/dL (ref 0.4–1.2)
Total Bilirubin: 0.3 mg/dL (ref 0.3–1.2)

## 2010-11-30 LAB — HEPATIC FUNCTION PANEL
Albumin: 2.9 g/dL — ABNORMAL LOW (ref 3.5–5.2)
Bilirubin, Direct: 0.1 mg/dL (ref 0.0–0.3)
Indirect Bilirubin: 0.1 mg/dL — ABNORMAL LOW (ref 0.3–0.9)
Total Bilirubin: 0.2 mg/dL — ABNORMAL LOW (ref 0.3–1.2)

## 2010-11-30 LAB — AMYLASE: Amylase: 4 U/L (ref 0–105)

## 2010-11-30 LAB — LIPASE, BLOOD: Lipase: 38 U/L (ref 11–59)

## 2010-11-30 LAB — CBC
HCT: 23.9 % — ABNORMAL LOW (ref 36.0–46.0)
Hemoglobin: 7.5 g/dL — ABNORMAL LOW (ref 12.0–15.0)
MCH: 23.4 pg — ABNORMAL LOW (ref 26.0–34.0)
MCV: 74.2 fL — ABNORMAL LOW (ref 78.0–100.0)
MCV: 74.5 fL — ABNORMAL LOW (ref 78.0–100.0)
Platelets: 211 10*3/uL (ref 150–400)
RBC: 3.21 MIL/uL — ABNORMAL LOW (ref 3.87–5.11)
RDW: 19.8 % — ABNORMAL HIGH (ref 11.5–15.5)
WBC: 5.2 10*3/uL (ref 4.0–10.5)

## 2010-11-30 LAB — URINE CULTURE: Culture  Setup Time: 201205181635

## 2010-11-30 LAB — BASIC METABOLIC PANEL
Calcium: 8.1 mg/dL — ABNORMAL LOW (ref 8.4–10.5)
Glucose, Bld: 82 mg/dL (ref 70–99)
Sodium: 133 mEq/L — ABNORMAL LOW (ref 135–145)

## 2010-11-30 MED ORDER — IOHEXOL 300 MG/ML  SOLN
125.0000 mL | Freq: Once | INTRAMUSCULAR | Status: AC | PRN
  Administered 2010-11-30: 125 mL via INTRAVENOUS
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
## 2010-12-01 ENCOUNTER — Inpatient Hospital Stay (HOSPITAL_COMMUNITY): Payer: Medicare Other

## 2010-12-01 LAB — ABO/RH: ABO/RH(D): A POS

## 2010-12-01 LAB — BASIC METABOLIC PANEL
BUN: 3 mg/dL — ABNORMAL LOW (ref 6–23)
CO2: 24 mEq/L (ref 19–32)
Chloride: 102 mEq/L (ref 96–112)
Creatinine, Ser: <0.47 mg/dL (ref 0.4–1.2)
Glucose, Bld: 88 mg/dL (ref 70–99)
Potassium: 3.4 mEq/L — ABNORMAL LOW (ref 3.5–5.1)

## 2010-12-01 LAB — DIFFERENTIAL
Eosinophils Absolute: 0.1 10*3/uL (ref 0.0–0.7)
Eosinophils Relative: 1 % (ref 0–5)
Lymphocytes Relative: 23 % (ref 12–46)
Lymphs Abs: 1.2 10*3/uL (ref 0.7–4.0)
Monocytes Relative: 10 % (ref 3–12)
Neutrophils Relative %: 65 % (ref 43–77)

## 2010-12-01 LAB — CBC
HCT: 21.6 % — ABNORMAL LOW (ref 36.0–46.0)
MCH: 23.4 pg — ABNORMAL LOW (ref 26.0–34.0)
MCV: 74.5 fL — ABNORMAL LOW (ref 78.0–100.0)
Platelets: 217 10*3/uL (ref 150–400)
RBC: 2.9 MIL/uL — ABNORMAL LOW (ref 3.87–5.11)

## 2010-12-01 LAB — POCT I-STAT 4, (NA,K, GLUC, HGB,HCT)
Glucose, Bld: 92 mg/dL (ref 70–99)
HCT: 26 % — ABNORMAL LOW (ref 36.0–46.0)
Potassium: 4 meq/L (ref 3.5–5.1)
Sodium: 137 meq/L (ref 135–145)

## 2010-12-02 ENCOUNTER — Inpatient Hospital Stay (HOSPITAL_COMMUNITY): Payer: Medicare Other

## 2010-12-02 LAB — CBC
MCH: 24.2 pg — ABNORMAL LOW (ref 26.0–34.0)
MCHC: 32.4 g/dL (ref 30.0–36.0)
MCV: 74.6 fL — ABNORMAL LOW (ref 78.0–100.0)
Platelets: 318 10*3/uL (ref 150–400)
RDW: 18.6 % — ABNORMAL HIGH (ref 11.5–15.5)

## 2010-12-02 LAB — PROTIME-INR: Prothrombin Time: 14.2 seconds (ref 11.6–15.2)

## 2010-12-02 LAB — BASIC METABOLIC PANEL
BUN: <3 mg/dL — ABNORMAL LOW (ref 6–23)
CO2: 27 mEq/L (ref 19–32)
Calcium: 8.6 mg/dL (ref 8.4–10.5)
Glucose, Bld: 117 mg/dL — ABNORMAL HIGH (ref 70–99)
Sodium: 132 mEq/L — ABNORMAL LOW (ref 135–145)

## 2010-12-03 ENCOUNTER — Inpatient Hospital Stay (HOSPITAL_COMMUNITY): Payer: Medicare Other

## 2010-12-03 LAB — CBC
HCT: 23.9 % — ABNORMAL LOW (ref 36.0–46.0)
Hemoglobin: 7.7 g/dL — ABNORMAL LOW (ref 12.0–15.0)
MCH: 24.2 pg — ABNORMAL LOW (ref 26.0–34.0)
MCHC: 32.2 g/dL (ref 30.0–36.0)
RDW: 19.2 % — ABNORMAL HIGH (ref 11.5–15.5)

## 2010-12-03 LAB — CROSSMATCH

## 2010-12-05 LAB — CROSSMATCH
ABO/RH(D): A POS
Unit division: 0

## 2010-12-05 LAB — PROTIME-INR
INR: 1.11 (ref 0.00–1.49)
Prothrombin Time: 14.5 seconds (ref 11.6–15.2)
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
## 2011-01-07 ENCOUNTER — Inpatient Hospital Stay (HOSPITAL_COMMUNITY)
Admission: RE | Admit: 2011-01-07 | Discharge: 2011-01-08 | DRG: 909 | Disposition: A | Discharge status: Home / Self Care | Payer: Medicare Other | Source: Ambulatory Visit | Attending: Orthopaedic Surgery | Admitting: Orthopaedic Surgery

## 2011-01-07 DIAGNOSIS — Y849 Medical procedure, unspecified as the cause of abnormal reaction of the patient, or of later complication, without mention of misadventure at the time of the procedure: Secondary | ICD-10-CM | POA: Diagnosis present

## 2011-01-07 DIAGNOSIS — F17200 Nicotine dependence, unspecified, uncomplicated: Secondary | ICD-10-CM | POA: Diagnosis present

## 2011-01-07 DIAGNOSIS — T8130XA Disruption of wound, unspecified, initial encounter: Principal | ICD-10-CM | POA: Diagnosis present

## 2011-01-07 LAB — COMPREHENSIVE METABOLIC PANEL
BUN: 9 mg/dL (ref 6–23)
CO2: 25 mEq/L (ref 19–32)
Calcium: 9.1 mg/dL (ref 8.4–10.5)
Chloride: 105 mEq/L (ref 96–112)
Creatinine, Ser: <0.47 mg/dL — ABNORMAL LOW (ref 0.50–1.10)
Total Bilirubin: 0.2 mg/dL — ABNORMAL LOW (ref 0.3–1.2)

## 2011-01-07 LAB — BILIRUBIN, DIRECT: Bilirubin, Direct: <0.1 mg/dL (ref 0.0–0.3)

## 2011-01-07 LAB — CBC
HCT: 37.9 % (ref 36.0–46.0)
Hemoglobin: 12 g/dL (ref 12.0–15.0)
MCH: 25.4 pg — ABNORMAL LOW (ref 26.0–34.0)
MCHC: 31.7 g/dL (ref 30.0–36.0)
RDW: 18.5 % — ABNORMAL HIGH (ref 11.5–15.5)

## 2011-01-07 LAB — HCG, SERUM, QUALITATIVE: Preg, Serum: NEGATIVE

## 2011-01-07 LAB — SURGICAL PCR SCREEN: MRSA, PCR: NEGATIVE

## 2011-01-11 NOTE — H&P (Signed)
  [name], DUELL NO.:  [id number]  MEDICAL RECORD NO.:  [id number]  LOCATION:  SDSC                         FACILITY:  [facility]  PHYSICIAN:  Vanita Panda. Magnus Ivan, M.D.DATE OF BIRTH:  15-Aug-1975  DATE OF ADMISSION:  01/07/2011 DATE OF DISCHARGE:                             HISTORY & PHYSICAL   CHIEF COMPLAINT:  Left ankle wound with wound dehiscence and exposed hardware status post ORIF of the complex distal tibia facture.  HISTORY OF PRESENT ILLNESS:  Ms. Long is a 35 year old female who is about a month now, status post ORIF of a complex distal tibia fracture. This was a fracture that was many hours old when she presented to the emergency room.  She had issues with soft tissue.  She was taken to the operating room, performed a open reduction and internal fixation with plating.  She is now presenting with small area of wound dehiscence and exposed hardware with no evidence of infection.  I have recommended that she undergo irrigation and debridement of the wound with wound treatment and possible removal of the some of the hardware.  Risks and benefits of the surgery have been explained to her in detail, she does wish to proceed to surgery.  PAST MEDICAL HISTORY:  Anxiety, depression, hepatitis C and drug dependency as well as bipolar disorder.  MEDICATIONS:  Morphine, oxycodone, Robaxin, Wellbutrin, Abilify, Valium, and Xanax.  ALLERGIES:  NO KNOWN DRUG ALLERGIES.  FAMILY HISTORY:  Noncontributory.  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  REVIEW OF SYSTEMS:  Negative for chest pain, shortness of breath, fevers, chills, nausea, or vomiting.  PHYSICAL EXAMINATION:  VITAL SIGNS:  She is afebrile with stable vitals signs. GENERAL:  She is alert and oriented x3 in acute distress or obvious discomfort. HEENT:  Normocephalic, atraumatic.  Pupils equal, round, and reactive to light. NECK:  Supple. LUNGS:  Clear to auscultation  bilateral. HEART:  Regular rate and rhythm. ABDOMEN:  Benign. EXTREMITIES:  On right ankle she has a small area of wound dehiscence and exposed hardware with no evidence of infection.  ASSESSMENT:  This is a 35 year old with right ankle trauma with wound dehiscence and exposed hardware.  PLAN:  We will proceed to the operating room today for irrigation and debridement of the wound and hopefully closure of the tissue and bag placement versus even some hardware removal.  She understands the risks and benefits of this and is ready to proceed this.     Vanita Panda. Magnus Ivan, M.D.     CYB/MEDQ  D:  01/07/2011  T:  01/07/2011  Job:  [id number]  Electronically Signed by Doneen Poisson M.D. on 01/11/2011 05:10:44 PM

## 2011-01-11 NOTE — Discharge Summary (Signed)
  NAMEKHALEESI, GRUEL NO.:  [id number]  MEDICAL RECORD NO.:  [id number]  LOCATION:  5041                         FACILITY:  [facility]  PHYSICIAN:  Vanita Panda. Magnus Ivan, M.D.DATE OF BIRTH:  07-25-1975  DATE OF ADMISSION:  01/07/2011 DATE OF DISCHARGE:  01/08/2011                              DISCHARGE SUMMARY   ADMITTING DIAGNOSIS:  Right ankle wound dehiscence with exposed hardware.  DISCHARGE DIAGNOSIS:  Right ankle wound dehiscence with exposed hardware.  PROCEDURE:  Irrigation and debridement of right ankle wound with removal of one retained screw, primary closure of wound and incisional VAC placement.  HOSPITAL COURSE:  Ms. Devera is a 35 year old female who was taken to the operating room on the day of admission for irrigation and debridement of a traumatic right ankle wound.  She had original distal tibia fracture and did not come to seek medical treatment for many hours.  She was then admitted and found to have a complex ankle injury at the distal tibia.  She was taken to the operating room and plating was performed.  She does not have a good social situation, has been on walking on this and has developed some wound breakdown.  There has been no evidence of infection.  She was taken to the operating room where she underwent irrigation and debridement, removal of one screw, mobilization of soft tissues with closure and incisional VAC placement. Postoperative day 1, the wound looked good.  It was felt that she can be discharged home with dressing changes.  DISPOSITION:  Discharged home.  DISCHARGE INSTRUCTIONS:  While she is at home, she will place Xeroform on the wound once a day with follow up in the office in only 4-5 days.  DISCHARGE MEDICATIONS: 1. See medical reconciliation orders. 2. Percocet. 3. Doxycycline.     Vanita Panda. Magnus Ivan, M.D.     CYB/MEDQ  D:  01/08/2011  T:  01/08/2011  Job:  [id number]  Electronically Signed by  Doneen Poisson M.D. on 01/11/2011 05:10:48 PM

## 2011-01-11 NOTE — Op Note (Signed)
  Long, AMEZCUA NO.:  [id number]  MEDICAL RECORD NO.:  [id number]  LOCATION:  5041                         FACILITY:  [facility]  PHYSICIAN:  Vanita Panda. Magnus Ivan, M.D.DATE OF BIRTH:  1976/01/22  DATE OF PROCEDURE:  01/07/2011 DATE OF DISCHARGE:                              OPERATIVE REPORT   PREOPERATIVE DIAGNOSIS:  Right ankle wound dehiscence, status post open reduction and internal fixation of distal tibia facture.  POSTOPERATIVE DIAGNOSIS:  Right ankle wound dehiscence, status post open reduction and internal fixation of distal tibia facture.  PROCEDURES: 1. Irrigation and debridement of right ankle wound. 2. Primary closure of right ankle wound. 3. Incisional  vacuum-assisted closure placed on the right ankle     wound.  SURGEON:  Vanita Panda. Magnus Ivan, MD  ANESTHESIA:  General.  BLOOD LOSS:  Minimal.  COMPLICATIONS:  None.  INDICATIONS:  [name] is a 35 year old female who sustained a complex distal tibia fracture of a right distal tibia.  She underwent plating of this several days later and now presents about a month with breakdown of the wound over the distal edge of the plate.  There was no evidence of infection, but I Mary hoping to get some extra healing, so I elected her to bring her to the operating room for irrigation and debridement of the wound with hopefully mobilizing the soft tissue to get it over the plate and incisional VAC placement, so we can get enough healing, formally eventually have to take the plate out.  She understands the risks and benefits of this and does wish to proceed with surgery.  PROCEDURE DESCRIPTION:  After informed consent was obtained, appropriate right ankle was marked.  She was brought to the operating room, placed supine on the operating table.  General anesthesia was then obtained. Her right ankle was then prepped and draped Betadine scrub and paint.  A time-out was called to identify the  correct patient and correct right ankle.  I then was able to see the small area where there was exposed plate.  I removed 1 screw without difficulty.  I then used #15 blade to get good bleeding tissue around the edges and to mobilized the soft tissue.  I then used pulsatile lavage and 3 L of normal saline solution to lavage the wound.  I closed the tissue over the plate with interrupted 2-0 nylon suture.  An incisional VAC was then placed, and set at -50 mm to continue resection.  The patient was awakened, extubated, and taken to recovery room in stable condition.  Postoperatively, I will keep in the hospital for 24-48 hours to get some good granulation tissue developed before letting her go home with dressing changes.     Vanita Panda. Magnus Ivan, M.D.     CYB/MEDQ  D:  01/07/2011  T:  01/08/2011  Job:  [id number]  Electronically Signed by Doneen Poisson M.D. on 01/11/2011 05:10:46 PM

## 2011-02-06 ENCOUNTER — Inpatient Hospital Stay (HOSPITAL_COMMUNITY)
Admission: RE | Admit: 2011-02-06 | Discharge: 2011-02-09 | DRG: 909 | Disposition: A | Discharge status: Home / Self Care | Payer: Medicare Other | Source: Ambulatory Visit | Attending: Orthopaedic Surgery | Admitting: Orthopaedic Surgery

## 2011-02-06 ENCOUNTER — Inpatient Hospital Stay (HOSPITAL_COMMUNITY): Payer: Medicare Other

## 2011-02-06 DIAGNOSIS — T81329A Deep disruption or dehiscence of operation wound, unspecified, initial encounter: Principal | ICD-10-CM | POA: Diagnosis present

## 2011-02-06 DIAGNOSIS — Z472 Encounter for removal of internal fixation device: Secondary | ICD-10-CM

## 2011-02-06 DIAGNOSIS — Z79899 Other long term (current) drug therapy: Secondary | ICD-10-CM

## 2011-02-06 DIAGNOSIS — B1920 Unspecified viral hepatitis C without hepatic coma: Secondary | ICD-10-CM | POA: Diagnosis present

## 2011-02-06 DIAGNOSIS — T8132XA Disruption of internal operation (surgical) wound, not elsewhere classified, initial encounter: Principal | ICD-10-CM | POA: Diagnosis present

## 2011-02-06 DIAGNOSIS — F341 Dysthymic disorder: Secondary | ICD-10-CM | POA: Diagnosis present

## 2011-02-06 DIAGNOSIS — Y831 Surgical operation with implant of artificial internal device as the cause of abnormal reaction of the patient, or of later complication, without mention of misadventure at the time of the procedure: Secondary | ICD-10-CM | POA: Diagnosis present

## 2011-02-06 LAB — CBC
MCH: 26.3 pg (ref 26.0–34.0)
MCHC: 33.3 g/dL (ref 30.0–36.0)
MCV: 79 fL (ref 78.0–100.0)
Platelets: 328 10*3/uL (ref 150–400)
RDW: 17.5 % — ABNORMAL HIGH (ref 11.5–15.5)

## 2011-02-06 LAB — COMPREHENSIVE METABOLIC PANEL
Alkaline Phosphatase: 91 U/L (ref 39–117)
BUN: 11 mg/dL (ref 6–23)
Calcium: 9.4 mg/dL (ref 8.4–10.5)
Creatinine, Ser: 0.47 mg/dL — ABNORMAL LOW (ref 0.50–1.10)
GFR calc Af Amer: >60 mL/min (ref >60)
Glucose, Bld: 88 mg/dL (ref 70–99)
Potassium: 4 mEq/L (ref 3.5–5.1)
Total Protein: 7 g/dL (ref 6.0–8.3)

## 2011-02-06 LAB — PROTIME-INR: Prothrombin Time: 13 seconds (ref 11.6–15.2)

## 2011-02-06 LAB — SURGICAL PCR SCREEN: MRSA, PCR: NEGATIVE

## 2011-02-06 LAB — HCG, SERUM, QUALITATIVE: Preg, Serum: NEGATIVE

## 2011-02-11 NOTE — H&P (Signed)
  [name], Long NO.:  [id number]  MEDICAL RECORD NO.:  [id number]  LOCATION:  2899                         FACILITY:  [facility]  PHYSICIAN:  Vanita Panda. Magnus Ivan, M.D.DATE OF BIRTH:  1975/12/11  DATE OF ADMISSION:  02/06/2011 DATE OF DISCHARGE:                             HISTORY & PHYSICAL   CHIEF COMPLAINT:  Right distal tibia fracture with possible nonunion with exposed hardware.  HISTORY OF PRESENT ILLNESS:  [name] is a 35 year old female, who almost 8 weeks ago sustained a terrible distal third tibia fracture near the joint.  She had delay in seeking treatment due inebriation.  When she did finally show up, she had significant compromise of the soft tissue on the medial aspect of her ankle.  We got her in better alignment and waited few days before we performed definitive plating. She has since developed wound dehiscence and breakdown with exposed hardware.  X-rays do show some bony union, but there is still concern about potential for nonunion.  We have decided to proceed with operation to remove the plate, irrigate and debride the right ankle, and possibly place external fixation.  The risks and benefits of this are explained to her in detail and she does wish to proceed with surgery.  PAST MEDICAL HISTORY: 1. Anxiety. 2. Depression. 3. Hepatitis C. 4. History of drug dependency, now clean.  MEDICATIONS: 1. Oxycodone. 2. Doxycycline. 3. Xanax. 4. Wellbutrin. 5. Abilify. 6. Valium.  FOOD OR DRUG ALLERGIES:  None.  PREVIOUS SURGERIES: 1. As above. 2. Tubal ligation.  FAMILY MEDICAL HISTORY:  Diabetes and cancer.  SOCIAL HISTORY:  She is a widow.  She is on disability.  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  She does smoke and drinks alcohol.  REVIEW OF SYSTEMS:  Negative for chest pain, shortness of breath, fevers, chills, nausea, and vomiting.  PHYSICAL EXAMINATION:  VITAL SIGNS:  She is afebrile with stable  vital signs. GENERAL:  She is alert and oriented x3 in no acute distress, obvious discomfort. HEENT:  Normocephalic and atraumatic.  Pupils equal, round, and reactive to light.  Extraocular movements intact. NECK:  Supple. LUNGS:  Clear to auscultation bilaterally. HEART:  Regular rate and rhythm. ABDOMEN:  Benign. EXTREMITIES:  Right ankle shows an area immediately with exposed plate with no gross purulence.  X-rays confirmed intact hardware and a healing fracture of the distal tibia, status post plating.  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  PLAN:  We are going to proceed to the operating room today and remove the medial plate, irrigate and debride the skin, soft tissue, muscle, and bone and then hopefully be able to treat her with splinting with possibility of external fixation.  She will then be admitted inpatient.     Vanita Panda. Magnus Ivan, M.D.     CYB/MEDQ  D:  02/06/2011  T:  02/06/2011  Job:  [id number]  Electronically Signed by Doneen Poisson M.D. on 02/11/2011 07:02:25 PM

## 2011-02-11 NOTE — Op Note (Signed)
[name], [name]:  [id number]  MEDICAL RECORD NO.:  [id number]  LOCATION:  5009                         FACILITY:  [facility]  PHYSICIAN:  Vanita Panda. Magnus Ivan, M.D.DATE OF BIRTH:  05/15/1976  DATE OF PROCEDURE:  02/06/2011 DATE OF DISCHARGE:                              OPERATIVE REPORT   PREOPERATIVE DIAGNOSES: 1. Retained hardware right distal tibia with exposed plate and screws. 2. Questionable nonunion right distal tibia fracture.  POSTOPERATIVE DIAGNOSES: 1. Exposed right distal tibia plate with wound dehiscence. 2. No evidence of nonunion.  PROCEDURES: 1. Removal of right tibia distal periarticular locking plate. 2. Irrigation and debridement of skin, soft tissue, muscle and bone,     right tibia. 3. Closure of right tibia wound with incisional VAC sponge placement.  SURGEON:  Vanita Panda. Magnus Ivan, MD  ANESTHESIA:  General.  BLOOD LOSS:  Less than 100 mL.  TOURNIQUET TIME:  30 minutes.  COMPLICATIONS:  None.  INDICATIONS:  [name] is a 35 year old female who 8 weeks ago underwent open reduction and internal fixation of a distal right tibia fracture that was near the ankle joint.  It was extraarticular, but she had a lot of soft tissue compromise due to the fact that she walked on her injury directly after it happened and did not seek treatment for many hours.  We eventually took her to the operating room for plating. She has since showed sign of healing the fracture but she has had a wound breakdown.  She has exposed hardware.  There is no evidence of infection.  We decided to take her to the operating room to remove the plate, stressed the fracture, washed the wound and hopefully we will close it with the potential of external fixation if the fracture is unstable.  PROCEDURE DESCRIPTION:  After informed consent was obtained, appropriate left ankle was marked.  She was brought to the operating room, placed supine on the  operating room table where general anesthesia was then obtained.  A nonsterile tourniquet was placed around her upper right thigh and her leg was prepped and draped with Betadine scrub and paint. A time-out was called to identify the correct patient and correct right leg.  I then had the tourniquet inflated 250 mg of pressure.  I made an incision along the medial aspect of the ankle to the previous incision. I also opened up the wound, the dehisced part was removed and debrided. Skin, soft tissue and muscles as well as bone were removed of all the screws and plates in its entirety.  Under direct fluoroscopic guidance, we then stressed the fracture and found it to not be moving at all.  I then used a Rongeur to debride further a soft tissue bone and muscle and any necrotic tissue.  I let the tourniquet down at 3 minutes and freshened up the edges to get good bleeding tissue.  I then copiously irrigated the wound with 3 L of normal saline solution, used pulsatile lavage and then loosely closed the skin with interrupted 2-0 nylon suture.  It did seem to be somewhat of a tight closure.  I did place Adaptic and an incisional VAC, set up -75 mm to  continue with suction. A well-padded splint was then placed on her ankle.  She was awakened, extubated, and taken to recovery room in stable condition.  All final counts were correct and there were no complications noted.  She will be admitted to inpatient with several days of VAC treatment to her wound before discharging her to home.     Vanita Panda. Magnus Ivan, M.D.     CYB/MEDQ  D:  02/06/2011  T:  02/06/2011  Job:  [id number]  Electronically Signed by Doneen Poisson M.D. on 02/11/2011 07:02:29 PM

## 2011-02-11 NOTE — Discharge Summary (Signed)
  NAMESIMRA, FIEBIG NO.:  [id number]  MEDICAL RECORD NO.:  [id number]  LOCATION:  5009                         FACILITY:  [facility]  PHYSICIAN:  Vanita Panda. Magnus Ivan, M.D.DATE OF BIRTH:  1975-10-26  DATE OF ADMISSION:  02/06/2011 DATE OF DISCHARGE:  02/09/2011                              DISCHARGE SUMMARY   ADMITTING DIAGNOSIS:  Right lower extremity wound dehiscence for the exposed hardware and questionable nonunion of distal third tibial fracture, this is all in the right.  DISCHARGE DIAGNOSIS:  Right lower extremity wound dehiscence with healing left distal tibial fracture.  PROCEDURES: 1. Irrigation and debridement of left ankle superficial and deep     tissues. 2. Hardware removal of the left ankle. 3. Closure of wound with incisional VAC placement.  HOSPITAL COURSE:  [name] is a 35 year old female who 8 weeks ago had a right lower extremity trauma.  She had a plating of distal tibial fracture and eventually developed wound dehiscence due to history of her trauma with exposed hardware.  We took her back to OR on the day of admission where we removed the plate, assessed the fracture under fluoroscopy and found it to be healing.  We were able to clean the tissue thoroughly and close the wound.  We then admitted her for [facility] placement of incision  to help with granulation to help with this healing.  By the day of discharge, I changed the dressing and the wound looked good.  DISPOSITION:  Discharged to home.  DISCHARGE INSTRUCTIONS AT HOME:  She will remain nonweightbearing on her right lower extremity and only be up in a Cam walker.  She will keep the incision clean and dry with appropriate dressing changes, and she will come back and see me in the office later this week.  Of note, she will continue all her same home medications with all antibiotics and narcotics.     Vanita Panda. Magnus Ivan, M.D.     CYB/MEDQ  D:  02/09/2011  T:   02/09/2011  Job:  [id number]  Electronically Signed by Doneen Poisson M.D. on 02/11/2011 07:02:33 PM

## 2011-02-25 ENCOUNTER — Inpatient Hospital Stay (HOSPITAL_COMMUNITY): Payer: Medicare Other

## 2011-02-25 ENCOUNTER — Inpatient Hospital Stay (HOSPITAL_COMMUNITY)
Admission: RE | Admit: 2011-02-25 | Discharge: 2011-03-03 | DRG: 494 | Disposition: A | Discharge status: Home Health | Payer: Medicare Other | Source: Ambulatory Visit | Attending: Orthopaedic Surgery | Admitting: Orthopaedic Surgery

## 2011-02-25 DIAGNOSIS — F17200 Nicotine dependence, unspecified, uncomplicated: Secondary | ICD-10-CM | POA: Diagnosis present

## 2011-02-25 DIAGNOSIS — IMO0002 Reserved for concepts with insufficient information to code with codable children: Principal | ICD-10-CM | POA: Diagnosis present

## 2011-02-25 DIAGNOSIS — F319 Bipolar disorder, unspecified: Secondary | ICD-10-CM | POA: Diagnosis present

## 2011-02-25 DIAGNOSIS — B1920 Unspecified viral hepatitis C without hepatic coma: Secondary | ICD-10-CM | POA: Diagnosis present

## 2011-02-25 LAB — CBC
MCH: 25.4 pg — ABNORMAL LOW (ref 26.0–34.0)
MCHC: 32.7 g/dL (ref 30.0–36.0)
Platelets: 476 10*3/uL — ABNORMAL HIGH (ref 150–400)
RBC: 4.76 MIL/uL (ref 3.87–5.11)
RDW: 17 % — ABNORMAL HIGH (ref 11.5–15.5)

## 2011-02-25 LAB — COMPREHENSIVE METABOLIC PANEL
BUN: 9 mg/dL (ref 6–23)
CO2: 28 mEq/L (ref 19–32)
Calcium: 9.6 mg/dL (ref 8.4–10.5)
Creatinine, Ser: <0.47 mg/dL — ABNORMAL LOW (ref 0.50–1.10)
Glucose, Bld: 88 mg/dL (ref 70–99)
Total Protein: 7.2 g/dL (ref 6.0–8.3)

## 2011-02-25 LAB — GRAM STAIN

## 2011-02-25 LAB — HCG, SERUM, QUALITATIVE: Preg, Serum: NEGATIVE

## 2011-02-25 LAB — PROTIME-INR: Prothrombin Time: 14.4 seconds (ref 11.6–15.2)

## 2011-02-27 LAB — WOUND CULTURE: Culture: NO GROWTH

## 2011-03-01 NOTE — Op Note (Signed)
NAMESUZY, KUGEL NO.:  [id number]  MEDICAL RECORD NO.:  [id number]  LOCATION:  5005                         FACILITY:  [facility]  PHYSICIAN:  Vanita Panda. Magnus Ivan, M.D.DATE OF BIRTH:  1975/12/11  DATE OF PROCEDURE:  02/25/2011 DATE OF DISCHARGE:                              OPERATIVE REPORT   PREOPERATIVE DIAGNOSES: 1. Right distal tibia fracture nonunion. 2. Chronic medial ankle wound, right ankle.  POSTOPERATIVE DIAGNOSES: 1. Right distal tibia fracture nonunion. 2. Chronic medial ankle wound, right ankle.  PROCEDURES: 1. Irrigation and debridement of right medial ankle wound. 2. Takedown of nonunion right distal tibia. 3. Hybrid external fixation using Ilizarov hybrid external fixator.  SURGEON:  Vanita Panda. Magnus Ivan, MD  ANESTHESIA:  Spinal.  ESTIMATED BLOOD LOSS:  Minimal.  COMPLICATIONS:  None.  INDICATION:  [name] is a 35 year old female who over 10 weeks ago sustained a distal tibia fracture.  She then walked on this fracture for several hours and then presented to the emergency room almost 10 to 12 hours later.  We put her in just a splint to take pressure off the fracture and then took her to the operating room several days later, performed plating of the fracture, the skin looked good; however, she ended up developing worsening eschar and full-thickness wound in the medial aspect of her ankle.  Eventually after postop week 8, went back to the operating room, I and Dd the wound and removed the plate and surprisingly I found the fracture to be stable under stressing under fluoro.  I was able to get the wound closed and gave her strict instructions for nonweightbearing; however, she did weight bear on this and now has presented with fracture being obviously not healed.  Now she is presenting for revision osteosynthesis with external fixation, clean the wound to make sure there is no evidence of osteomyelitis and trying to get  the chronic wound to heal as well.  The risks and benefits of this has been explained to her in detail and she understands the reasoning behind the surgery.  PROCEDURE DESCRIPTION:  After informed consent was obtained, appropriate right ankle was marked.  She was brought to the operating room, placed supine on the operating table.  She was then set up and spinal anesthesia was then obtained.  A nonsterile tourniquet was placed on her upper right leg and her left was prepped and draped with Betadine scrub and paint.  Time out was called and she was identified as correct patient and correct right leg.  I then made incision directly around the chronic wound and removed necrotic tissue.  I dissected down easily to the fracture site and was able to take down the nonunion.  I sent the cultures from this nonunion, which showed no evidence of infection. Once I have completed curetted this, we packed Stimulan antibiotic beads with vancomycin and gentamicin into the wound and the fracture site.  I then was able to reapproximate the tissue using interrupted 2-0 nylon suture which was still quite a tight closure.  Next, we placed the Ilizarov hybrid external fixation with 3 pins distally and 2 pins proximally as well as a stabilization pin.  Once we  got the frame in place, we were able to assess the fracture under direct fluoroscopic guidance and I was able to get it in better alignment.  We elected to frame down in and cut all the pins outside the skin and the frame.  I then last placed an incisional VAC over neath the wound to hopefully get some granulation tissue.  It was set to suction and had a good seal. She was then taken to the recovery room in stable condition. Postoperatively, she will be noted as an inpatient for continued IV antibiotics and pain control with continued assessment of her wound.     Vanita Panda. Magnus Ivan, M.D.     CYB/MEDQ  D:  02/25/2011  T:  02/26/2011  Job:   [id number]  Electronically Signed by Doneen Poisson M.D. on 03/01/2011 11:20:36 AM

## 2011-03-01 NOTE — H&P (Signed)
  Mary Long, [name]:  [id number]  MEDICAL RECORD NO.:  [id number]  LOCATION:  5005                         FACILITY:  [facility]  PHYSICIAN:  Vanita Panda. Magnus Ivan, M.D.DATE OF BIRTH:  [birth year]  DATE OF ADMISSION:  02/25/2011 DATE OF DISCHARGE:                             HISTORY & PHYSICAL   CHIEF COMPLAINT:  Right ankle pain with known nonunion of distal tibia fracture and questionable infection.  HISTORY OF PRESENT ILLNESS:  Mary Long is a 35 year old female who almost 3 months ago now sustained a right distal tibia fracture.  She had worked on the fracture and presented to the ER about 8-12 hours later.  She had eschar on the medial aspect of her ankle and waited for several days, and finally took her to the operating room.  We excised the eschar and plated the fibula as well as the distal tibia.  She then developed wound breakdown.  We tried to get this to heal.  We then did irrigation and debridement.  Eventually, I had to take out the hardware just over 8 weeks from the tibia and she has shown signs of healing; however, against my medical advice, she did walk on this and presented to the office last week with a varus deformity and obvious shortening and still with the wound.  She now presents for external fixation, I and D, and trying to salvage the wound.  PAST MEDICAL HISTORY: 1. Depression. 2. Drug dependency. 3. Hepatitis C. 4. Bipolar disorder.  MEDICATIONS: 1. Morphine. 2. Oxycodone. 3. Robaxin. 4. Wellbutrin. 5. Abilify. 6. Valium. 7. Xanax.  DRUG ALLERGIES:  None.  FAMILY MEDICAL HISTORY:  Diabetes and cancer.  SOCIAL HISTORY:  She is a widower.  She is disabled.  She does smoke and drink alcohol.  REVIEW OF SYSTEMS:  Negative for chest pain, shortness of breath, fevers, chills, nausea, or vomiting.  PHYSICAL EXAMINATION:  VITAL SIGNS:  She is afebrile with stable vital signs. GENERAL:  She is alert and oriented x3,  in no acute distress. HEENT:  Normocephalic and atraumatic.  Pupils are equal, round, and reactive to light.  Extraocular movements intact. NECK:  Supple.  No JVD.  No bruits. LUNGS:  Clear to auscultation bilaterally. HEART:  Regular rate and rhythm. ABDOMEN:  Benign. EXTREMITIES:  Right ankle, she has a varus deformities.  She is neurovascularly intact.  She has open wound on the medial aspect of the ankle.  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  PLAN:  We are going to take her to the operating room today for a thorough irrigation and debridement, external fixation placement, and possible antibiotic beads as well as trying to get the wound to improve. She understands the risks and benefits of this and the reason behind proceeding with surgery and she is ready to go.     Vanita Panda. Magnus Ivan, M.D.     CYB/MEDQ  D:  02/25/2011  T:  02/25/2011  Job:  [id number]  Electronically Signed by Doneen Poisson M.D. on 03/01/2011 11:20:34 AM

## 2011-03-02 LAB — ANAEROBIC CULTURE

## 2011-03-07 NOTE — Discharge Summary (Signed)
  [name], [facility] NO.:  [id number]  MEDICAL RECORD NO.:  [id number]  LOCATION:  5005                         FACILITY:  [facility]  PHYSICIAN:  Vanita Panda. 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  PROCEDURES: 1. Irrigation and debridement of left ankle wound. 2. Take down of nonunion left distal tibia. 3. Hybrid external fixation, left tibia.  HOSPITAL COURSE:  [name] is a 35 year old female with bipolar disorder who several months ago sustained distal tibia fracture.  We plated this fracture, but then she developed wound breakdown.  She has since gone on to not heal the fracture.  She had been walking on itagainst medical advice and she sustained further breakdown of the wound. She was taken to the operating room on the day of admission where she underwent a takedown of the nonunion, I and D of the fracture site, closure of the wound, and placement of Hybrid external fixator.  She has now been in the hospital for the rest of the week due to the need for IV antibiotics as well as poor situation and needed to make sure this wound would improve.  On the day of discharge, we felt the wound was doing much better, I will establish for getting her some support at home.  It was felt she could be discharged stable to home.  DISCHARGE MEDICATIONS: 1. Oxycodone. 2. Robaxin. 3. Doxycycline. 4. Alprazolam. 5. Wellbutrin. 6. Lithium. 7. Adderall. 8. Ambien.  DISCHARGE INSTRUCTIONS:  Once she is at home, she will remain nonweightbearing on her right lower extremity.  She should change Xeroform daily to her right ankle wound.  She will follow up in the office in a  week.     Vanita Panda. Magnus Ivan, M.D.     CYB/MEDQ  D:  03/03/2011  T:  03/03/2011  Job:  [id number]  Electronically Signed by Doneen Poisson M.D. on 03/07/2011 06:54:57 PM

## 2011-04-17 LAB — URINALYSIS, ROUTINE W REFLEX MICROSCOPIC
Hgb urine dipstick: NEGATIVE
Nitrite: NEGATIVE
Protein, ur: NEGATIVE mg/dL
Specific Gravity, Urine: 1.025 (ref 1.005–1.030)
Urobilinogen, UA: 0.2 mg/dL (ref 0.0–1.0)

## 2011-04-17 LAB — RAPID URINE DRUG SCREEN, HOSP PERFORMED
Amphetamines: POSITIVE — AB
Barbiturates: NOT DETECTED
Opiates: POSITIVE — AB

## 2011-04-17 LAB — COMPREHENSIVE METABOLIC PANEL
Albumin: 3.6 g/dL (ref 3.5–5.2)
Alkaline Phosphatase: 65 U/L (ref 39–117)
BUN: 9 mg/dL (ref 6–23)
Chloride: 103 mEq/L (ref 96–112)
Creatinine, Ser: 0.69 mg/dL (ref 0.4–1.2)
GFR calc non Af Amer: >60 mL/min (ref >60)
Glucose, Bld: 77 mg/dL (ref 70–99)
Potassium: 3.9 mEq/L (ref 3.5–5.1)
Total Bilirubin: 0.5 mg/dL (ref 0.3–1.2)

## 2011-04-17 LAB — CBC
HCT: 31 % — ABNORMAL LOW (ref 36.0–46.0)
Hemoglobin: 10 g/dL — ABNORMAL LOW (ref 12.0–15.0)
MCV: 74.5 fL — ABNORMAL LOW (ref 78.0–100.0)
Platelets: 335 10*3/uL (ref 150–400)
RDW: 16.7 % — ABNORMAL HIGH (ref 11.5–15.5)
WBC: 7.4 10*3/uL (ref 4.0–10.5)

## 2011-04-17 LAB — DIFFERENTIAL
Basophils Absolute: 0.1 10*3/uL (ref 0.0–0.1)
Basophils Relative: 1 % (ref 0–1)
Lymphocytes Relative: 24 % (ref 12–46)
Monocytes Absolute: 0.3 10*3/uL (ref 0.1–1.0)
Neutro Abs: 5.2 10*3/uL (ref 1.7–7.7)
Neutrophils Relative %: 71 % (ref 43–77)

## 2011-04-17 LAB — ACETAMINOPHEN LEVEL: Acetaminophen (Tylenol), Serum: <10 ug/mL — ABNORMAL LOW (ref 10–30)

## 2011-04-17 LAB — SALICYLATE LEVEL: Salicylate Lvl: <4 mg/dL (ref 2.8–20.0)

## 2011-04-17 LAB — ETHANOL: Alcohol, Ethyl (B): 5 mg/dL (ref 0–10)

## 2011-04-21 ENCOUNTER — Other Ambulatory Visit (HOSPITAL_COMMUNITY): Payer: Self-pay | Admitting: Orthopedic Surgery

## 2011-04-21 DIAGNOSIS — IMO0002 Reserved for concepts with insufficient information to code with codable children: Secondary | ICD-10-CM

## 2011-04-22 ENCOUNTER — Ambulatory Visit (HOSPITAL_COMMUNITY)
Admission: RE | Admit: 2011-04-22 | Discharge: 2011-04-22 | Disposition: A | Discharge status: Home / Self Care | Payer: Medicare Other | Source: Ambulatory Visit | Attending: Orthopedic Surgery | Admitting: Orthopedic Surgery

## 2011-04-22 DIAGNOSIS — M949 Disorder of cartilage, unspecified: Secondary | ICD-10-CM | POA: Insufficient documentation

## 2011-04-22 DIAGNOSIS — M899 Disorder of bone, unspecified: Secondary | ICD-10-CM | POA: Insufficient documentation

## 2011-04-22 DIAGNOSIS — IMO0002 Reserved for concepts with insufficient information to code with codable children: Secondary | ICD-10-CM

## 2011-06-29 ENCOUNTER — Emergency Department (HOSPITAL_COMMUNITY)
Admission: EM | Admit: 2011-06-29 | Discharge: 2011-06-29 | Discharge status: Against Medical Advice | Payer: Medicare Other

## 2011-06-30 ENCOUNTER — Emergency Department (HOSPITAL_COMMUNITY)
Admission: EM | Admit: 2011-06-30 | Discharge: 2011-06-30 | Disposition: A | Discharge status: Home / Self Care | Payer: Medicare Other | Source: Home / Self Care | Attending: Emergency Medicine | Admitting: Emergency Medicine

## 2011-06-30 NOTE — ED Notes (Signed)
Pt called x 2 

## 2011-06-30 NOTE — ED Notes (Signed)
Pt called x1

## 2011-07-10 ENCOUNTER — Emergency Department (HOSPITAL_COMMUNITY)
Admission: EM | Admit: 2011-07-10 | Discharge: 2011-07-10 | Disposition: A | Discharge status: Home / Self Care | Payer: Medicare Other | Attending: Emergency Medicine | Admitting: Emergency Medicine

## 2011-07-10 ENCOUNTER — Emergency Department (HOSPITAL_COMMUNITY): Payer: Medicare Other

## 2011-07-10 ENCOUNTER — Encounter (HOSPITAL_COMMUNITY): Payer: Self-pay | Admitting: Emergency Medicine

## 2011-07-10 DIAGNOSIS — Z981 Arthrodesis status: Secondary | ICD-10-CM | POA: Insufficient documentation

## 2011-07-10 DIAGNOSIS — M79609 Pain in unspecified limb: Secondary | ICD-10-CM | POA: Insufficient documentation

## 2011-07-10 DIAGNOSIS — L0390 Cellulitis, unspecified: Secondary | ICD-10-CM

## 2011-07-10 DIAGNOSIS — L0291 Cutaneous abscess, unspecified: Secondary | ICD-10-CM | POA: Insufficient documentation

## 2011-07-10 DIAGNOSIS — F17200 Nicotine dependence, unspecified, uncomplicated: Secondary | ICD-10-CM | POA: Insufficient documentation

## 2011-07-10 DIAGNOSIS — M79606 Pain in leg, unspecified: Secondary | ICD-10-CM

## 2011-07-10 MED ORDER — VANCOMYCIN HCL IN DEXTROSE 1-5 GM/200ML-% IV SOLN
1000.0000 mg | Freq: Once | INTRAVENOUS | Status: AC
Start: 2011-07-10 — End: 2011-07-10
  Administered 2011-07-10: 1000 mg via INTRAVENOUS
  Filled 2011-07-10: qty 200

## 2011-07-10 MED ORDER — CEPHALEXIN 500 MG PO CAPS: 500.0000 mg | ORAL_CAPSULE | Freq: Four times a day (QID) | ORAL | Status: AC

## 2011-07-10 MED ORDER — HYDROMORPHONE HCL PF 1 MG/ML IJ SOLN
1.0000 mg | Freq: Once | INTRAMUSCULAR | Status: AC
  Administered 2011-07-10: 1 mg via INTRAVENOUS
  Filled 2011-07-10: qty 1

## 2011-07-10 MED ORDER — ONDANSETRON HCL 4 MG/2ML IJ SOLN
4.0000 mg | Freq: Once | INTRAMUSCULAR | Status: AC
  Administered 2011-07-10: 4 mg via INTRAVENOUS
  Filled 2011-07-10: qty 2

## 2011-07-10 MED ORDER — SODIUM CHLORIDE 0.9 % IV BOLUS (SEPSIS)
1000.0000 mL | Freq: Once | INTRAVENOUS | Status: AC
  Administered 2011-07-10: 1000 mL via INTRAVENOUS

## 2011-07-10 NOTE — ED Notes (Signed)
Pt made aware that she's being d/c to call for a transport.

## 2011-07-10 NOTE — ED Notes (Addendum)
Pt presented to ed from triage c/o rt ankle pain s/p surgery/ external fixator  4 mos ago, (+) swelling (+) redness

## 2011-07-10 NOTE — ED Notes (Signed)
Waiting physician evaluation 

## 2011-07-10 NOTE — ED Notes (Signed)
Pt pupils are pinpoint, sluggish to respond to light, eyes wander, speech is garbled
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
## 2011-07-10 NOTE — ED Notes (Signed)
Pt alert, nad, c/o right ankle pain, onset several weeks ago, pt presents with external fixator to right ankle, area reddened, no drainage noted, denies new trauma or injury

## 2011-07-10 NOTE — ED Notes (Signed)
Pt care assumed, obtained verbal report.  Pt resting comfortably with friend at bedside.  External fixator noted to RLE, pt reports surgery x 1 month ago, never followed up as instructed.  Pt is drowsy but is arousable verbally.  Pt's RLE appears swollen and red.  Deformity from surgery noted.  Pt's R arm appears swollen, pt denies any injury to that extremity.  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

## 2011-10-11 ENCOUNTER — Encounter (HOSPITAL_COMMUNITY): Payer: Self-pay | Admitting: Emergency Medicine

## 2011-10-11 ENCOUNTER — Emergency Department (HOSPITAL_COMMUNITY): Payer: Medicare Other

## 2011-10-11 ENCOUNTER — Emergency Department (HOSPITAL_COMMUNITY)
Admission: EM | Admit: 2011-10-11 | Discharge: 2011-10-11 | Disposition: A | Discharge status: Home / Self Care | Payer: Medicare Other | Attending: Emergency Medicine | Admitting: Emergency Medicine

## 2011-10-11 DIAGNOSIS — F17200 Nicotine dependence, unspecified, uncomplicated: Secondary | ICD-10-CM | POA: Insufficient documentation

## 2011-10-11 DIAGNOSIS — F319 Bipolar disorder, unspecified: Secondary | ICD-10-CM | POA: Insufficient documentation

## 2011-10-11 DIAGNOSIS — IMO0002 Reserved for concepts with insufficient information to code with codable children: Secondary | ICD-10-CM | POA: Insufficient documentation

## 2011-10-11 DIAGNOSIS — B182 Chronic viral hepatitis C: Secondary | ICD-10-CM | POA: Insufficient documentation

## 2011-10-11 DIAGNOSIS — M25579 Pain in unspecified ankle and joints of unspecified foot: Secondary | ICD-10-CM | POA: Insufficient documentation

## 2011-10-11 DIAGNOSIS — M25571 Pain in right ankle and joints of right foot: Secondary | ICD-10-CM

## 2011-10-11 HISTORY — DX: Other psychoactive substance use, unspecified, uncomplicated: F19.90

## 2011-10-11 HISTORY — DX: Bipolar disorder, unspecified: F31.9

## 2011-10-11 HISTORY — DX: Major depressive disorder, single episode, unspecified: F32.9

## 2011-10-11 HISTORY — DX: Depression, unspecified: F32.A

## 2011-10-11 HISTORY — DX: Chronic viral hepatitis C: B18.2

## 2011-10-11 LAB — DIFFERENTIAL
Basophils Absolute: 0 10*3/uL (ref 0.0–0.1)
Basophils Relative: 0 % (ref 0–1)
Eosinophils Absolute: 0.2 10*3/uL (ref 0.0–0.7)
Eosinophils Relative: 4 % (ref 0–5)
Lymphocytes Relative: 43 % (ref 12–46)
Monocytes Absolute: 0.4 10*3/uL (ref 0.1–1.0)

## 2011-10-11 LAB — CBC
Hemoglobin: 10.1 g/dL — ABNORMAL LOW (ref 12.0–15.0)
MCH: 22.6 pg — ABNORMAL LOW (ref 26.0–34.0)
MCHC: 31 g/dL (ref 30.0–36.0)
MCV: 73.1 fL — ABNORMAL LOW (ref 78.0–100.0)
Platelets: 355 10*3/uL (ref 150–400)
RBC: 4.46 MIL/uL (ref 3.87–5.11)
RDW: 19.8 % — ABNORMAL HIGH (ref 11.5–15.5)

## 2011-10-11 LAB — SEDIMENTATION RATE: Sed Rate: 30 mm/hr — ABNORMAL HIGH (ref 0–22)

## 2011-10-11 LAB — COMPREHENSIVE METABOLIC PANEL
AST: 34 U/L (ref 0–37)
Albumin: 3.1 g/dL — ABNORMAL LOW (ref 3.5–5.2)
BUN: 13 mg/dL (ref 6–23)
Calcium: 8.9 mg/dL (ref 8.4–10.5)
Creatinine, Ser: 0.6 mg/dL (ref 0.50–1.10)
Total Bilirubin: 0.2 mg/dL — ABNORMAL LOW (ref 0.3–1.2)

## 2011-10-11 NOTE — ED Provider Notes (Signed)
Medical screening examination/treatment/procedure(s) were performed by non-physician practitioner and as supervising physician I was immediately available for consultation/collaboration.   Kaydon Husby, MD 10/11/11 2333 

## 2011-10-11 NOTE — Discharge Instructions (Signed)
Ms Privette Dr. Thomasena Edis would like [place] to put a posterior splint on your R ankle.  You can follow up with him next week put he strongly advises you to go back to the same doctor that saw you in [place].  The 5th metacarpal is fractured but this seems to be an old injury.  ER for severe pain are other concerns. Continue to use your crutches at home.
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
## 2011-10-11 NOTE — ED Provider Notes (Signed)
6:30 Report received from [facility] for patient with chronic R ankle pain with surgeries in the past [place]  Splint removed yesterday by patient and she put weight on the R foot and was not supposed to.  Pain since.   9:30 Pt is lethargic and states she took xanax prior to arrival.  Back to her physician in [place] would like to be to an orthopedic physician in [place]. Spoke with Dr. Thomasena Edis and he recommends that we put a posterior splint on her right ankle and will see her in a week but he strongly recommends that she go back to the same doctor that knows her case. Discharged without pain medication because of her lethargy after taking xanax in the ER today.  Good sensation and movement of the foot and toes on the right with a 2+ pedal pulses. Will call ortho in to provide splint.  Spoke with Stacy rn.   1500.  Patient was discharged without posterior splint. I called her at home and she will return for ortho to put the splint on.  Stacy RN discharged patient.  Report given to triage RN and Fast track PA.  Pt will be put in triage room for splint placement and not sign in.    Jethro Bastos, NP 10/11/11 Rickey Primus

## 2011-10-11 NOTE — ED Notes (Signed)
Pt. Is inattentive upon assessment, claimed of being sleepy had taken xanax prior to visit to this department.
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
## 2011-10-11 NOTE — ED Notes (Signed)
Made nurse aware of oxygen level at 90 I put her on 2 liters

## 2011-10-11 NOTE — ED Notes (Signed)
Pt presents w/ injury to R ankle. Pt states she removed plaster splint yesterday and then stepped back onto R foot. Pt was not supposed to bear weight on R leg due to prior injury for which she was wearing the plaster splint, initially. Pt denies ETOH intoxication, but shows some evidence of altered level of consciousness with regard to speech and inability to keep her eyes open.

## 2011-10-11 NOTE — ED Provider Notes (Signed)
Medical screening examination/treatment/procedure(s) were performed by non-physician practitioner and as supervising physician I was immediately available for consultation/collaboration.   Avira Tillison, MD 10/11/11 2332 

## 2011-10-13 ENCOUNTER — Encounter (HOSPITAL_COMMUNITY): Payer: Self-pay | Admitting: *Deleted

## 2011-10-13 ENCOUNTER — Emergency Department (HOSPITAL_COMMUNITY)
Admission: EM | Admit: 2011-10-13 | Discharge: 2011-10-13 | Disposition: A | Discharge status: Home / Self Care | Payer: Medicare Other | Attending: Emergency Medicine | Admitting: Emergency Medicine

## 2011-10-13 DIAGNOSIS — B1920 Unspecified viral hepatitis C without hepatic coma: Secondary | ICD-10-CM | POA: Insufficient documentation

## 2011-10-13 DIAGNOSIS — Z09 Encounter for follow-up examination after completed treatment for conditions other than malignant neoplasm: Secondary | ICD-10-CM

## 2011-10-13 DIAGNOSIS — F319 Bipolar disorder, unspecified: Secondary | ICD-10-CM | POA: Insufficient documentation

## 2011-10-13 DIAGNOSIS — Z79899 Other long term (current) drug therapy: Secondary | ICD-10-CM | POA: Insufficient documentation

## 2011-10-13 DIAGNOSIS — F17200 Nicotine dependence, unspecified, uncomplicated: Secondary | ICD-10-CM | POA: Insufficient documentation

## 2011-10-13 DIAGNOSIS — Z4789 Encounter for other orthopedic aftercare: Secondary | ICD-10-CM | POA: Insufficient documentation

## 2011-10-13 NOTE — ED Notes (Signed)
Pt returning to [facility], sts that she was discharged from [facility] 10/11/11 and when she arrived home [facility] called to inform her that she needed to come back to have a cast put on her foot.

## 2011-10-13 NOTE — ED Notes (Signed)
Pt sts she does not want to wait on Ortho Tech to apply splint. Just wants to see Ortho Dr in [place]. Informed Catherine, PA of this and she sts she is fine with this. Pt d/c with plan to call Ortho and have them apply preferred splint in office. Pt applied her own post op boot at discharge and was taken out by wheelchair.
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
## 2011-10-13 NOTE — ED Provider Notes (Signed)
Medical screening examination/treatment/procedure(s) were performed by non-physician practitioner and as supervising physician I was immediately available for consultation/collaboration.   Dione Booze, MD 10/13/11 [phone]

## 2011-10-13 NOTE — Discharge Instructions (Signed)
It is very important that you call and make a followup appointment with Dr. Lorin Picket as soon as possible. You have been placed in a posterior leg splint today. Please use the crutches and keep your weight off of the ankle until seen by Dr. Lorin Picket. Keep the leg elevated, prepped up on several pillows. Use ice to the area for 20 minutes at a time several times daily.  RESOURCE GUIDE  Dental Problems  Patients with Medicaid: [facility] [phone] [address]                                           [phone] [address] Phone:  [phone]                                                  Phone:  [phone]  If unable to pay or uninsured, contact:  [facility] or [facility]. to become qualified for the adult dental clinic.  Chronic Pain Problems Contact [facility]  [phone] Patients need to be referred by their primary care doctor.  Insufficient Money for Medicine Contact [employer]:  call "211" or [facility] 470-286-3740.  No Primary Care Doctor Call [facility]  [phone] Other agencies that provide inexpensive medical care    [facility]  [phone]    [facility]  [phone]    [facility]  [phone]    [facility]  [phone]    [facility]  [phone]    [facility]  [phone]  Psychological Services [facility]  [phone] [facility] Services  [phone] [facility] Mental Health   [phone] (emergency services [phone])  Substance Abuse Resources [facility] and Drug Services  [phone] [facility] [phone] [facility] [place] [phone] Floydene Flock 725-767-4248 Residential & Outpatient Substance Abuse Program  [phone]  Abuse/Neglect [facility] Child Abuse Hotline 825-271-6631 [facility] Child Abuse Hotline 219-430-1894 (After Hours)  Emergency Shelter [facility]  Ministries [phone]  Maternity Homes [facility] [place] [facility] [phone] [facility] [phone]  MRSA Hotline #:   8452176064    [facility] Resources  [facility] [place]     [employer]                          [facility] [address] [place]                       [address]      [address]  Patrecia Pace  [facility] Phone:  [phone]                                   Phone:  [phone]                 Phone:  [place] Phone:  [place] [phone] (After Hours)

## 2011-11-29 ENCOUNTER — Emergency Department (HOSPITAL_COMMUNITY): Payer: Medicare Other

## 2011-11-29 ENCOUNTER — Encounter (HOSPITAL_COMMUNITY): Payer: Self-pay | Admitting: Family Medicine

## 2011-11-29 ENCOUNTER — Emergency Department (HOSPITAL_COMMUNITY)
Admission: EM | Admit: 2011-11-29 | Discharge: 2011-11-29 | Disposition: A | Discharge status: Home / Self Care | Payer: Medicare Other | Attending: Emergency Medicine | Admitting: Emergency Medicine

## 2011-11-29 DIAGNOSIS — L03119 Cellulitis of unspecified part of limb: Secondary | ICD-10-CM | POA: Insufficient documentation

## 2011-11-29 DIAGNOSIS — L0390 Cellulitis, unspecified: Secondary | ICD-10-CM

## 2011-11-29 DIAGNOSIS — F319 Bipolar disorder, unspecified: Secondary | ICD-10-CM | POA: Insufficient documentation

## 2011-11-29 DIAGNOSIS — M7989 Other specified soft tissue disorders: Secondary | ICD-10-CM | POA: Insufficient documentation

## 2011-11-29 DIAGNOSIS — F17200 Nicotine dependence, unspecified, uncomplicated: Secondary | ICD-10-CM | POA: Insufficient documentation

## 2011-11-29 DIAGNOSIS — L02419 Cutaneous abscess of limb, unspecified: Secondary | ICD-10-CM | POA: Insufficient documentation

## 2011-11-29 DIAGNOSIS — B182 Chronic viral hepatitis C: Secondary | ICD-10-CM | POA: Insufficient documentation

## 2011-11-29 LAB — CBC
Hemoglobin: 9.1 g/dL — ABNORMAL LOW (ref 12.0–15.0)
MCH: 22.4 pg — ABNORMAL LOW (ref 26.0–34.0)
MCV: 71.9 fL — ABNORMAL LOW (ref 78.0–100.0)
Platelets: 473 10*3/uL — ABNORMAL HIGH (ref 150–400)
RBC: 4.06 MIL/uL (ref 3.87–5.11)
WBC: 8 10*3/uL (ref 4.0–10.5)

## 2011-11-29 LAB — DIFFERENTIAL
Eosinophils Absolute: 0.1 10*3/uL (ref 0.0–0.7)
Eosinophils Relative: 1 % (ref 0–5)
Lymphocytes Relative: 17 % (ref 12–46)
Lymphs Abs: 1.4 10*3/uL (ref 0.7–4.0)
Monocytes Relative: 8 % (ref 3–12)
Neutrophils Relative %: 74 % (ref 43–77)

## 2011-11-29 LAB — BASIC METABOLIC PANEL
BUN: 9 mg/dL (ref 6–23)
CO2: 26 mEq/L (ref 19–32)
Glucose, Bld: 108 mg/dL — ABNORMAL HIGH (ref 70–99)
Potassium: 3 mEq/L — ABNORMAL LOW (ref 3.5–5.1)
Sodium: 133 mEq/L — ABNORMAL LOW (ref 135–145)

## 2011-11-29 MED ORDER — SULFAMETHOXAZOLE-TMP DS 800-160 MG PO TABS: 1.0000 | ORAL_TABLET | Freq: Two times a day (BID) | ORAL | Status: AC

## 2011-11-29 MED ORDER — OXYCODONE HCL 5 MG PO TABS: 5.0000 mg | ORAL_TABLET | ORAL | Status: AC | PRN

## 2011-11-29 MED ORDER — CEPHALEXIN 500 MG PO CAPS: 500.0000 mg | ORAL_CAPSULE | Freq: Two times a day (BID) | ORAL | Status: AC

## 2011-11-29 MED ORDER — CEPHALEXIN 500 MG PO CAPS
500.0000 mg | ORAL_CAPSULE | Freq: Once | ORAL | Status: AC
  Administered 2011-11-29: 500 mg via ORAL
  Filled 2011-11-29: qty 1

## 2011-11-29 MED ORDER — SULFAMETHOXAZOLE-TMP DS 800-160 MG PO TABS
1.0000 | ORAL_TABLET | Freq: Once | ORAL | Status: AC
  Administered 2011-11-29: 1 via ORAL
  Filled 2011-11-29: qty 1

## 2011-11-29 NOTE — Discharge Instructions (Signed)
Cellulitis Cellulitis is an infection of the tissue under the skin. The infected area is usually red and tender. This is caused by germs. These germs enter the body through cuts or sores. This usually happens in the arms or lower legs. HOME CARE   Take your medicine as told. Finish it even if you start to feel better.   If the infection is on the arm or leg, keep it raised (elevated).   Use a warm cloth on the infected area several times a day.   See your doctor for a follow-up visit as told.  GET HELP RIGHT AWAY IF:   You are tired or confused.   You throw up (vomit).   You have watery poop (diarrhea).   You feel ill and have muscle aches.   You have a fever.  MAKE SURE YOU:   Understand these instructions.   Will watch your condition.   Will get help right away if you are not doing well or get worse.  Document Released: 12/17/2007 Document Revised: 06/19/2011 Document Reviewed: 06/01/2009 [facility] Patient Information 2012 [place], [place]. Please call Dr. Lorin Picket to make an appointment for follow up evaluation for next week
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
## 2011-11-29 NOTE — ED Notes (Signed)
Patient states that Dr.Scott from [facility] told her to come to ER for evaluation of left leg and foot swelling. States leg "is always slightly swollen but for the past 2 days swelling has gotten worse."

## 2011-12-01 NOTE — ED Provider Notes (Signed)
Medical screening examination/treatment/procedure(s) were performed by non-physician practitioner and as supervising physician I was immediately available for consultation/collaboration.   Cyndra Numbers, MD 12/01/11 [phone]

## 2012-11-22 IMAGING — RF DG TIBIA/FIBULA 2V*R*
1 series · 1 of 1 positions shown · non-contrast
Comparison: 12/01/2010.

CLINICAL DATA: History of removal of right tibial plate and screws.

RIGHT TIBIA AND FIBULA - 2 VIEW

[Series 1: run · 1 of 1 slices shown]
[im 1/1]
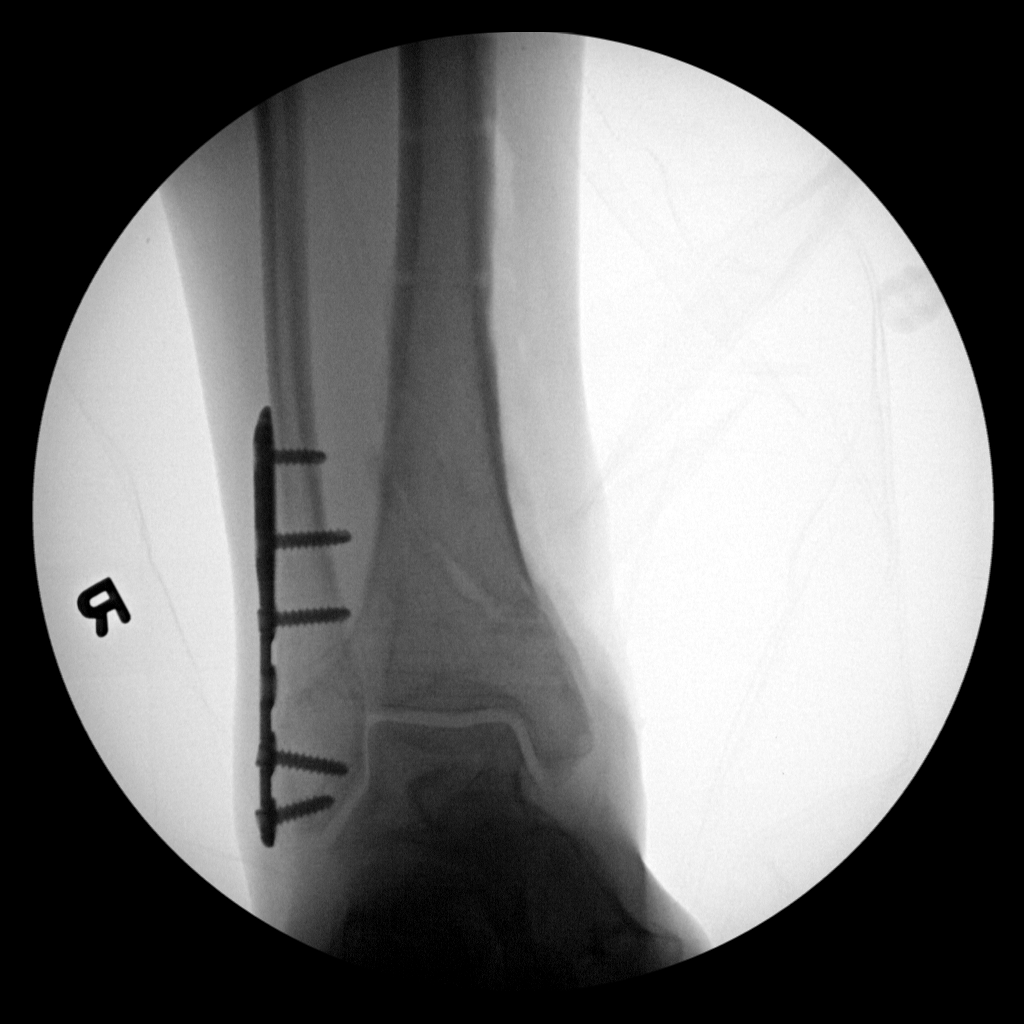

[1 of 1 positions shown; findings below may reference images not displayed]

FINDINGS: There has been a previous oblique fracture of the distal
tibial diaphysis and metaphysis.  The cortical plate and screws
which were present previously stabilizing the tibial fracture have
been removed. The fracture appears to be healing with apposition at
fracture site with anatomic alignment.  There has been a previous
fracture of the fibula with ORIF.  The fibular cortical side plate
and screws remain in place. There is normal alignment.  Mortise is
preserved.
IMPRESSION: Interval removal of the tibial cortical plate and screws.

## 2012-12-17 ENCOUNTER — Emergency Department (HOSPITAL_COMMUNITY): Payer: No Typology Code available for payment source

## 2012-12-17 ENCOUNTER — Encounter (HOSPITAL_COMMUNITY): Payer: Self-pay

## 2012-12-17 ENCOUNTER — Inpatient Hospital Stay (HOSPITAL_COMMUNITY)
Admission: EM | Admit: 2012-12-17 | Discharge: 2012-12-19 | DRG: 481 | Disposition: A | Discharge status: Home Health | Payer: No Typology Code available for payment source | Attending: Orthopedic Surgery | Admitting: Orthopedic Surgery

## 2012-12-17 DIAGNOSIS — M25579 Pain in unspecified ankle and joints of unspecified foot: Secondary | ICD-10-CM | POA: Diagnosis present

## 2012-12-17 DIAGNOSIS — G8929 Other chronic pain: Secondary | ICD-10-CM | POA: Diagnosis present

## 2012-12-17 DIAGNOSIS — S72409A Unspecified fracture of lower end of unspecified femur, initial encounter for closed fracture: Secondary | ICD-10-CM

## 2012-12-17 DIAGNOSIS — S72401A Unspecified fracture of lower end of right femur, initial encounter for closed fracture: Secondary | ICD-10-CM

## 2012-12-17 DIAGNOSIS — Y998 Other external cause status: Secondary | ICD-10-CM

## 2012-12-17 DIAGNOSIS — IMO0002 Reserved for concepts with insufficient information to code with codable children: Secondary | ICD-10-CM | POA: Diagnosis present

## 2012-12-17 DIAGNOSIS — H5440 Blindness, one eye, unspecified eye: Secondary | ICD-10-CM | POA: Diagnosis present

## 2012-12-17 DIAGNOSIS — F1120 Opioid dependence, uncomplicated: Secondary | ICD-10-CM | POA: Diagnosis present

## 2012-12-17 DIAGNOSIS — B182 Chronic viral hepatitis C: Secondary | ICD-10-CM | POA: Diagnosis present

## 2012-12-17 DIAGNOSIS — S72499A Other fracture of lower end of unspecified femur, initial encounter for closed fracture: Principal | ICD-10-CM | POA: Diagnosis present

## 2012-12-17 DIAGNOSIS — F17200 Nicotine dependence, unspecified, uncomplicated: Secondary | ICD-10-CM | POA: Diagnosis present

## 2012-12-17 DIAGNOSIS — Y92410 Unspecified street and highway as the place of occurrence of the external cause: Secondary | ICD-10-CM

## 2012-12-17 DIAGNOSIS — F3130 Bipolar disorder, current episode depressed, mild or moderate severity, unspecified: Secondary | ICD-10-CM | POA: Diagnosis present

## 2012-12-17 DIAGNOSIS — M86669 Other chronic osteomyelitis, unspecified tibia and fibula: Secondary | ICD-10-CM | POA: Diagnosis present

## 2012-12-17 DIAGNOSIS — D62 Acute posthemorrhagic anemia: Secondary | ICD-10-CM | POA: Diagnosis not present

## 2012-12-17 DIAGNOSIS — M899 Disorder of bone, unspecified: Secondary | ICD-10-CM | POA: Diagnosis present

## 2012-12-17 DIAGNOSIS — S7290XA Unspecified fracture of unspecified femur, initial encounter for closed fracture: Secondary | ICD-10-CM

## 2012-12-17 DIAGNOSIS — Z8614 Personal history of Methicillin resistant Staphylococcus aureus infection: Secondary | ICD-10-CM

## 2012-12-17 HISTORY — DX: Unspecified convulsions: R56.9

## 2012-12-17 LAB — URINALYSIS, ROUTINE W REFLEX MICROSCOPIC
Bilirubin Urine: NEGATIVE
Glucose, UA: NEGATIVE mg/dL
Hgb urine dipstick: NEGATIVE
Ketones, ur: NEGATIVE mg/dL
Leukocytes, UA: NEGATIVE
Nitrite: NEGATIVE
Protein, ur: NEGATIVE mg/dL
Specific Gravity, Urine: 1.013 (ref 1.005–1.030)
Urobilinogen, UA: 1 mg/dL (ref 0.0–1.0)
pH: 8 (ref 5.0–8.0)

## 2012-12-17 LAB — POCT I-STAT, CHEM 8
Creatinine, Ser: 0.5 mg/dL (ref 0.50–1.10)
HCT: 36 % (ref 36.0–46.0)
Hemoglobin: 12.2 g/dL (ref 12.0–15.0)
Potassium: 3.7 mEq/L (ref 3.5–5.1)
Sodium: 140 mEq/L (ref 135–145)

## 2012-12-17 LAB — POCT PREGNANCY, URINE: Preg Test, Ur: NEGATIVE

## 2012-12-17 MED ORDER — LORAZEPAM 2 MG/ML IJ SOLN
1.0000 mg | Freq: Once | INTRAMUSCULAR | Status: AC
  Administered 2012-12-17: 1 mg via INTRAVENOUS
  Filled 2012-12-17: qty 1

## 2012-12-17 MED ORDER — HYDROMORPHONE HCL PF 1 MG/ML IJ SOLN
1.0000 mg | Freq: Once | INTRAMUSCULAR | Status: AC
  Administered 2012-12-17: 1 mg via INTRAVENOUS
  Filled 2012-12-17: qty 1

## 2012-12-17 MED ORDER — SODIUM CHLORIDE 0.9 % IV SOLN
Freq: Once | INTRAVENOUS | Status: AC
  Administered 2012-12-17: 14:00:00 via INTRAVENOUS

## 2012-12-17 NOTE — Progress Notes (Signed)
Pt confirms pcp is OSEI-BONSU, GEORGE  EPIC updated Pt inquired about pain medicine Cm notified ED RN
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
## 2012-12-17 NOTE — ED Notes (Addendum)
MD at bedside.  EDPA Thayer Ohm evaluated this pt before this Clinical research associate.

## 2012-12-17 NOTE — ED Notes (Signed)
MD at bedside. Ortho MD present to evaluate this pt.  Pt requesting pain med- Amy RN made aware

## 2012-12-17 NOTE — ED Notes (Signed)
MD at bedside.  EDp Alan in to see pt- pt requested to go to bathroom- holding medication until pt comes back to room
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
## 2012-12-17 NOTE — ED Notes (Signed)
Patient transported to X-ray 
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
## 2012-12-17 NOTE — ED Provider Notes (Signed)
Medical screening examination/treatment/procedure(s) were conducted as a shared visit with non-physician practitioner(s) and myself.  I personally evaluated the patient during the encounter  Pt seen and examined--right distal thigh swelling noted and femur fx on xray noted, will be seen by ortho  Toy Baker, MD 12/17/12 1311
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
## 2012-12-17 NOTE — ED Notes (Signed)
MD at bedside.  [facility] here to see pt

## 2012-12-17 NOTE — ED Notes (Signed)
Patient taken by [facility] for transport to [facility].
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
## 2012-12-17 NOTE — ED Notes (Signed)
Bladder scan done. Pt shows 235 ml of urine on patient's midsection to left side.

## 2012-12-17 NOTE — ED Notes (Signed)
Attempted to call report. Nurse unavailable. Nurse given return call contact number.

## 2012-12-18 ENCOUNTER — Inpatient Hospital Stay (HOSPITAL_COMMUNITY): Payer: No Typology Code available for payment source

## 2012-12-18 ENCOUNTER — Inpatient Hospital Stay (HOSPITAL_COMMUNITY): Payer: No Typology Code available for payment source | Admitting: Certified Registered"

## 2012-12-18 ENCOUNTER — Encounter (HOSPITAL_COMMUNITY): Payer: Self-pay | Admitting: Certified Registered"

## 2012-12-18 ENCOUNTER — Encounter (HOSPITAL_COMMUNITY)
Admission: EM | Disposition: A | Discharge status: Home Health | Payer: Self-pay | Source: Home / Self Care | Attending: Orthopedic Surgery

## 2012-12-18 DIAGNOSIS — S72409A Unspecified fracture of lower end of unspecified femur, initial encounter for closed fracture: Secondary | ICD-10-CM

## 2012-12-18 HISTORY — PX: ORIF FEMUR FRACTURE: SHX2119

## 2012-12-18 LAB — CBC
HCT: 27.9 % — ABNORMAL LOW (ref 36.0–46.0)
Hemoglobin: 9.1 g/dL — ABNORMAL LOW (ref 12.0–15.0)
MCV: 74.4 fL — ABNORMAL LOW (ref 78.0–100.0)
RBC: 3.75 MIL/uL — ABNORMAL LOW (ref 3.87–5.11)
WBC: 11.9 10*3/uL — ABNORMAL HIGH (ref 4.0–10.5)

## 2012-12-18 LAB — SURGICAL PCR SCREEN
MRSA, PCR: POSITIVE — AB
Staphylococcus aureus: POSITIVE — AB

## 2012-12-18 LAB — PREPARE RBC (CROSSMATCH)

## 2012-12-18 LAB — POCT I-STAT 4, (NA,K, GLUC, HGB,HCT)
Glucose, Bld: 113 mg/dL — ABNORMAL HIGH (ref 70–99)
Hemoglobin: 8.8 g/dL — ABNORMAL LOW (ref 12.0–15.0)
Potassium: 2.6 mEq/L — CL (ref 3.5–5.1)

## 2012-12-18 LAB — BASIC METABOLIC PANEL
BUN: <3 mg/dL — ABNORMAL LOW (ref 6–23)
Chloride: 104 mEq/L (ref 96–112)
Creatinine, Ser: 0.45 mg/dL — ABNORMAL LOW (ref 0.50–1.10)
GFR calc Af Amer: >90 mL/min (ref >90)
Glucose, Bld: 125 mg/dL — ABNORMAL HIGH (ref 70–99)

## 2012-12-18 SURGERY — OPEN REDUCTION INTERNAL FIXATION (ORIF) DISTAL FEMUR FRACTURE
Anesthesia: General | Site: Leg Upper | Laterality: Right | Wound class: Clean

## 2012-12-18 MED ORDER — DEXTROSE 5 % IV SOLN
500.0000 mg | Freq: Four times a day (QID) | INTRAVENOUS | Status: DC | PRN
  Filled 2012-12-18: qty 5

## 2012-12-18 MED ORDER — MUPIROCIN 2 % EX OINT
1.0000 "application " | TOPICAL_OINTMENT | Freq: Two times a day (BID) | CUTANEOUS | Status: DC
  Administered 2012-12-18 – 2012-12-19 (×2): 1 via NASAL
  Filled 2012-12-18: qty 22

## 2012-12-18 MED ORDER — HYDROMORPHONE HCL PF 1 MG/ML IJ SOLN: 0.2500 mg | INTRAMUSCULAR | Status: DC | PRN

## 2012-12-18 MED ORDER — METHADONE HCL 10 MG PO TABS
65.0000 mg | ORAL_TABLET | Freq: Every day | ORAL | Status: DC
  Administered 2012-12-18 – 2012-12-19 (×2): 65 mg via ORAL
  Filled 2012-12-18 (×2): qty 7

## 2012-12-18 MED ORDER — OXYCODONE HCL 5 MG PO TABS: 5.0000 mg | ORAL_TABLET | Freq: Four times a day (QID) | ORAL | Status: DC | PRN

## 2012-12-18 MED ORDER — LIDOCAINE HCL (CARDIAC) 20 MG/ML IV SOLN
INTRAVENOUS | Status: DC | PRN
  Administered 2012-12-18: 50 mg via INTRAVENOUS

## 2012-12-18 MED ORDER — HYDROMORPHONE HCL PF 1 MG/ML IJ SOLN
1.0000 mg | INTRAMUSCULAR | Status: DC | PRN
  Administered 2012-12-18: 1 mg via INTRAVENOUS
  Filled 2012-12-18: qty 1

## 2012-12-18 MED ORDER — METOCLOPRAMIDE HCL 5 MG/ML IJ SOLN: 5.0000 mg | Freq: Three times a day (TID) | INTRAMUSCULAR | Status: DC | PRN

## 2012-12-18 MED ORDER — POLYETHYLENE GLYCOL 3350 17 G PO PACK: 17.0000 g | PACK | Freq: Every day | ORAL | Status: DC | PRN

## 2012-12-18 MED ORDER — OXYCODONE HCL 5 MG/5ML PO SOLN: 5.0000 mg | Freq: Once | ORAL | Status: AC | PRN

## 2012-12-18 MED ORDER — OXYCODONE HCL 5 MG PO TABS
5.0000 mg | ORAL_TABLET | ORAL | Status: DC | PRN
  Administered 2012-12-18 – 2012-12-19 (×5): 10 mg via ORAL
  Filled 2012-12-18 (×5): qty 2

## 2012-12-18 MED ORDER — BUPROPION HCL 100 MG PO TABS
100.0000 mg | ORAL_TABLET | Freq: Two times a day (BID) | ORAL | Status: DC
  Administered 2012-12-18 – 2012-12-19 (×3): 100 mg via ORAL
  Filled 2012-12-18 (×4): qty 1

## 2012-12-18 MED ORDER — HYDROMORPHONE HCL 2 MG PO TABS: 2.0000 mg | ORAL_TABLET | ORAL | Status: DC | PRN

## 2012-12-18 MED ORDER — BISACODYL 10 MG RE SUPP: 10.0000 mg | Freq: Every day | RECTAL | Status: DC | PRN

## 2012-12-18 MED ORDER — ONDANSETRON HCL 4 MG/2ML IJ SOLN
INTRAMUSCULAR | Status: DC | PRN
  Administered 2012-12-18: 4 mg via INTRAVENOUS

## 2012-12-18 MED ORDER — OXYCODONE HCL 5 MG PO TABS: 5.0000 mg | ORAL_TABLET | Freq: Once | ORAL | Status: AC | PRN

## 2012-12-18 MED ORDER — ONDANSETRON HCL 4 MG/2ML IJ SOLN: 4.0000 mg | Freq: Three times a day (TID) | INTRAMUSCULAR | Status: DC | PRN

## 2012-12-18 MED ORDER — POTASSIUM CHLORIDE CRYS ER 20 MEQ PO TBCR
40.0000 meq | EXTENDED_RELEASE_TABLET | Freq: Two times a day (BID) | ORAL | Status: DC
  Administered 2012-12-18 – 2012-12-19 (×3): 40 meq via ORAL
  Filled 2012-12-18 (×4): qty 2

## 2012-12-18 MED ORDER — ZOLPIDEM TARTRATE 5 MG PO TABS: 5.0000 mg | ORAL_TABLET | Freq: Every evening | ORAL | Status: DC | PRN

## 2012-12-18 MED ORDER — AMPHETAMINE-DEXTROAMPHETAMINE 10 MG PO TABS
20.0000 mg | ORAL_TABLET | Freq: Every day | ORAL | Status: DC
  Filled 2012-12-18: qty 2

## 2012-12-18 MED ORDER — HYDROMORPHONE HCL PF 1 MG/ML IJ SOLN: 1.0000 mg | INTRAMUSCULAR | Status: DC | PRN

## 2012-12-18 MED ORDER — VANCOMYCIN HCL IN DEXTROSE 1-5 GM/200ML-% IV SOLN: 1000.0000 mg | Freq: Two times a day (BID) | INTRAVENOUS | Status: DC

## 2012-12-18 MED ORDER — PHENOL 1.4 % MT LIQD
1.0000 | OROMUCOSAL | Status: DC | PRN
  Administered 2012-12-18: 1 via OROMUCOSAL
  Filled 2012-12-18: qty 177

## 2012-12-18 MED ORDER — SENNA-DOCUSATE SODIUM 8.6-50 MG PO TABS: 2.0000 | ORAL_TABLET | Freq: Every day | ORAL | Status: DC

## 2012-12-18 MED ORDER — CHLORHEXIDINE GLUCONATE CLOTH 2 % EX PADS
6.0000 | MEDICATED_PAD | Freq: Every day | CUTANEOUS | Status: DC
  Administered 2012-12-19: 6 via TOPICAL

## 2012-12-18 MED ORDER — POTASSIUM CHLORIDE 10 MEQ/100ML IV SOLN
10.0000 meq | INTRAVENOUS | Status: AC
  Administered 2012-12-18 (×4): 10 meq via INTRAVENOUS
  Filled 2012-12-18 (×4): qty 100

## 2012-12-18 MED ORDER — SUCCINYLCHOLINE CHLORIDE 20 MG/ML IJ SOLN
INTRAMUSCULAR | Status: DC | PRN
  Administered 2012-12-18: 100 mg via INTRAVENOUS

## 2012-12-18 MED ORDER — DIAZEPAM 5 MG PO TABS
5.0000 mg | ORAL_TABLET | Freq: Four times a day (QID) | ORAL | Status: DC | PRN
  Administered 2012-12-18 – 2012-12-19 (×4): 5 mg via ORAL
  Filled 2012-12-18 (×4): qty 1

## 2012-12-18 MED ORDER — ALUM & MAG HYDROXIDE-SIMETH 200-200-20 MG/5ML PO SUSP: 30.0000 mL | ORAL | Status: DC | PRN

## 2012-12-18 MED ORDER — LACTATED RINGERS IV SOLN
INTRAVENOUS | Status: DC | PRN
  Administered 2012-12-18 (×2): via INTRAVENOUS

## 2012-12-18 MED ORDER — ACETAMINOPHEN 325 MG PO TABS
650.0000 mg | ORAL_TABLET | Freq: Four times a day (QID) | ORAL | Status: DC | PRN
  Administered 2012-12-18 – 2012-12-19 (×2): 650 mg via ORAL
  Filled 2012-12-18 (×3): qty 2

## 2012-12-18 MED ORDER — MIDAZOLAM HCL 5 MG/5ML IJ SOLN
INTRAMUSCULAR | Status: DC | PRN
  Administered 2012-12-18: 2 mg via INTRAVENOUS

## 2012-12-18 MED ORDER — SENNA 8.6 MG PO TABS
1.0000 | ORAL_TABLET | Freq: Two times a day (BID) | ORAL | Status: DC
  Administered 2012-12-18 – 2012-12-19 (×2): 8.6 mg via ORAL
  Filled 2012-12-18 (×3): qty 1

## 2012-12-18 MED ORDER — ONDANSETRON HCL 4 MG PO TABS: 4.0000 mg | ORAL_TABLET | Freq: Four times a day (QID) | ORAL | Status: DC | PRN

## 2012-12-18 MED ORDER — PROPOFOL 10 MG/ML IV BOLUS
INTRAVENOUS | Status: DC | PRN
  Administered 2012-12-18: 30 mg via INTRAVENOUS
  Administered 2012-12-18: 20 mg via INTRAVENOUS
  Administered 2012-12-18: 50 mg via INTRAVENOUS
  Administered 2012-12-18: 20 mg via INTRAVENOUS
  Administered 2012-12-18: 130 mg via INTRAVENOUS

## 2012-12-18 MED ORDER — BUPIVACAINE HCL (PF) 0.25 % IJ SOLN
INTRAMUSCULAR | Status: AC
  Filled 2012-12-18: qty 30

## 2012-12-18 MED ORDER — OXYCODONE HCL 10 MG PO TABS: 10.0000 mg | ORAL_TABLET | Freq: Four times a day (QID) | ORAL | Status: DC | PRN

## 2012-12-18 MED ORDER — ACETAMINOPHEN 650 MG RE SUPP: 650.0000 mg | Freq: Four times a day (QID) | RECTAL | Status: DC | PRN

## 2012-12-18 MED ORDER — CEFAZOLIN SODIUM-DEXTROSE 2-3 GM-% IV SOLR
2.0000 g | INTRAVENOUS | Status: AC
  Administered 2012-12-18: 2 g via INTRAVENOUS
  Filled 2012-12-18: qty 50

## 2012-12-18 MED ORDER — DOCUSATE SODIUM 100 MG PO CAPS
100.0000 mg | ORAL_CAPSULE | Freq: Two times a day (BID) | ORAL | Status: DC
  Administered 2012-12-18 – 2012-12-19 (×3): 100 mg via ORAL
  Filled 2012-12-18 (×4): qty 1

## 2012-12-18 MED ORDER — VANCOMYCIN HCL IN DEXTROSE 1-5 GM/200ML-% IV SOLN
1000.0000 mg | Freq: Once | INTRAVENOUS | Status: AC
  Administered 2012-12-18: 1000 mg via INTRAVENOUS

## 2012-12-18 MED ORDER — CEFAZOLIN SODIUM-DEXTROSE 2-3 GM-% IV SOLR
2.0000 g | Freq: Four times a day (QID) | INTRAVENOUS | Status: AC
  Administered 2012-12-18 (×2): 2 g via INTRAVENOUS
  Filled 2012-12-18 (×2): qty 50

## 2012-12-18 MED ORDER — HYDROMORPHONE HCL PF 1 MG/ML IJ SOLN
0.5000 mg | INTRAMUSCULAR | Status: DC | PRN
  Administered 2012-12-18 (×3): 1 mg via INTRAVENOUS
  Filled 2012-12-18 (×3): qty 1

## 2012-12-18 MED ORDER — MENTHOL 3 MG MT LOZG: 1.0000 | LOZENGE | OROMUCOSAL | Status: DC | PRN

## 2012-12-18 MED ORDER — ONDANSETRON HCL 4 MG/2ML IJ SOLN: 4.0000 mg | Freq: Four times a day (QID) | INTRAMUSCULAR | Status: DC | PRN

## 2012-12-18 MED ORDER — ONDANSETRON HCL 4 MG/2ML IJ SOLN: 4.0000 mg | Freq: Once | INTRAMUSCULAR | Status: AC | PRN

## 2012-12-18 MED ORDER — POTASSIUM CHLORIDE IN NACL 20-0.45 MEQ/L-% IV SOLN
INTRAVENOUS | Status: DC
  Administered 2012-12-18: 03:00:00 via INTRAVENOUS
  Filled 2012-12-18 (×4): qty 1000

## 2012-12-18 MED ORDER — BUPIVACAINE HCL (PF) 0.25 % IJ SOLN
INTRAMUSCULAR | Status: DC | PRN
  Administered 2012-12-18: 20 mL

## 2012-12-18 MED ORDER — HYDROMORPHONE HCL PF 1 MG/ML IJ SOLN
1.0000 mg | INTRAMUSCULAR | Status: DC | PRN
  Administered 2012-12-18 – 2012-12-19 (×9): 1 mg via INTRAVENOUS
  Filled 2012-12-18 (×9): qty 1

## 2012-12-18 MED ORDER — METHOCARBAMOL 500 MG PO TABS: 500.0000 mg | ORAL_TABLET | Freq: Four times a day (QID) | ORAL | Status: DC

## 2012-12-18 MED ORDER — METHOCARBAMOL 500 MG PO TABS
500.0000 mg | ORAL_TABLET | Freq: Four times a day (QID) | ORAL | Status: DC | PRN
  Administered 2012-12-18 – 2012-12-19 (×3): 500 mg via ORAL
  Filled 2012-12-18 (×3): qty 1

## 2012-12-18 MED ORDER — DIAZEPAM 5 MG PO TABS: 5.0000 mg | ORAL_TABLET | Freq: Four times a day (QID) | ORAL | Status: DC | PRN

## 2012-12-18 MED ORDER — METOCLOPRAMIDE HCL 10 MG PO TABS: 5.0000 mg | ORAL_TABLET | Freq: Three times a day (TID) | ORAL | Status: DC | PRN

## 2012-12-18 MED ORDER — NEOSTIGMINE METHYLSULFATE 1 MG/ML IJ SOLN
INTRAMUSCULAR | Status: DC | PRN
  Administered 2012-12-18: 3 mg via INTRAVENOUS

## 2012-12-18 MED ORDER — POTASSIUM CHLORIDE IN NACL 20-0.45 MEQ/L-% IV SOLN
INTRAVENOUS | Status: DC
  Administered 2012-12-18: 16:00:00 via INTRAVENOUS
  Filled 2012-12-18 (×4): qty 1000

## 2012-12-18 MED ORDER — ALPRAZOLAM 0.5 MG PO TABS
1.0000 mg | ORAL_TABLET | Freq: Two times a day (BID) | ORAL | Status: DC
  Administered 2012-12-18 – 2012-12-19 (×3): 1 mg via ORAL
  Filled 2012-12-18 (×2): qty 1
  Filled 2012-12-18 (×2): qty 2

## 2012-12-18 MED ORDER — ROCURONIUM BROMIDE 100 MG/10ML IV SOLN
INTRAVENOUS | Status: DC | PRN
  Administered 2012-12-18: 50 mg via INTRAVENOUS

## 2012-12-18 MED ORDER — GLYCOPYRROLATE 0.2 MG/ML IJ SOLN
INTRAMUSCULAR | Status: DC | PRN
  Administered 2012-12-18: 0.4 mg via INTRAVENOUS

## 2012-12-18 MED ORDER — FLEET ENEMA 7-19 GM/118ML RE ENEM: 1.0000 | ENEMA | Freq: Once | RECTAL | Status: AC | PRN

## 2012-12-18 MED ORDER — VECURONIUM BROMIDE 10 MG IV SOLR
INTRAVENOUS | Status: DC | PRN
  Administered 2012-12-18 (×3): 1 mg via INTRAVENOUS
  Administered 2012-12-18: 2 mg via INTRAVENOUS
  Administered 2012-12-18 (×2): 1 mg via INTRAVENOUS

## 2012-12-18 MED ORDER — SUFENTANIL CITRATE 50 MCG/ML IV SOLN
INTRAVENOUS | Status: DC | PRN
  Administered 2012-12-18: 30 ug via INTRAVENOUS
  Administered 2012-12-18: 20 ug via INTRAVENOUS
  Administered 2012-12-18 (×2): 10 ug via INTRAVENOUS

## 2012-12-18 MED ORDER — MEPERIDINE HCL 25 MG/ML IJ SOLN: 6.2500 mg | INTRAMUSCULAR | Status: DC | PRN

## 2012-12-18 MED ORDER — DIAZEPAM 5 MG PO TABS
5.0000 mg | ORAL_TABLET | Freq: Four times a day (QID) | ORAL | Status: DC | PRN
  Administered 2012-12-18: 5 mg via ORAL
  Filled 2012-12-18: qty 1

## 2012-12-18 SURGICAL SUPPLY — 80 items
APL SKNCLS STERI-STRIP NONHPOA (GAUZE/BANDAGES/DRESSINGS) ×2
BANDAGE ELASTIC 6 VELCRO ST LF (GAUZE/BANDAGES/DRESSINGS) ×3 IMPLANT
BANDAGE ESMARK 6X9 LF (GAUZE/BANDAGES/DRESSINGS) IMPLANT
BENZOIN TINCTURE PRP APPL 2/3 (GAUZE/BANDAGES/DRESSINGS) ×2 IMPLANT
BIT DRILL 2.5 X LONG (BIT) ×1
BIT DRILL 2.8 (BIT) ×1
BIT DRILL CANN QC 2.8X165 (BIT) IMPLANT
BIT DRILL GUIDEWIRE 2.5X200 (WIRE) ×3 IMPLANT
BIT DRILL X LONG 2.5 (BIT) IMPLANT
BNDG CMPR 9X6 STRL LF SNTH (GAUZE/BANDAGES/DRESSINGS)
BNDG ESMARK 6X9 LF (GAUZE/BANDAGES/DRESSINGS)
BOOTCOVER CLEANROOM LRG (PROTECTIVE WEAR) ×4 IMPLANT
CLOTH BEACON ORANGE TIMEOUT ST (SAFETY) ×2 IMPLANT
COVER SURGICAL LIGHT HANDLE (MISCELLANEOUS) ×2 IMPLANT
CUFF TOURNIQUET SINGLE 34IN LL (TOURNIQUET CUFF) IMPLANT
CUFF TOURNIQUET SINGLE 44IN (TOURNIQUET CUFF) IMPLANT
DECANTER SPIKE VIAL GLASS SM (MISCELLANEOUS) IMPLANT
DRAPE C-ARM 42X72 X-RAY (DRAPES) ×1 IMPLANT
DRAPE OEC MINIVIEW 54X84 (DRAPES) IMPLANT
DRAPE U-SHAPE 47X51 STRL (DRAPES) IMPLANT
DRILL BIT 2.8MM (BIT) ×2
DRILL BIT X LONG 2.5 (BIT) ×2
DRSG ADAPTIC 3X8 NADH LF (GAUZE/BANDAGES/DRESSINGS) ×1 IMPLANT
DRSG PAD ABDOMINAL 8X10 ST (GAUZE/BANDAGES/DRESSINGS) ×2 IMPLANT
DURAPREP 26ML APPLICATOR (WOUND CARE) ×2 IMPLANT
ELECT REM PT RETURN 9FT ADLT (ELECTROSURGICAL) ×2
ELECTRODE REM PT RTRN 9FT ADLT (ELECTROSURGICAL) ×1 IMPLANT
GAUZE XEROFORM 1X8 LF (GAUZE/BANDAGES/DRESSINGS) ×4 IMPLANT
GLOVE BIOGEL PI ORTHO PRO SZ8 (GLOVE) ×1
GLOVE ORTHO TXT STRL SZ7.5 (GLOVE) ×2 IMPLANT
GLOVE PI ORTHO PRO STRL SZ8 (GLOVE) ×1 IMPLANT
GLOVE SURG ORTHO 8.0 STRL STRW (GLOVE) ×4 IMPLANT
GOWN STRL NON-REIN LRG LVL3 (GOWN DISPOSABLE) IMPLANT
K-WIRE 2.0X150M (WIRE) ×2
KIT BASIN OR (CUSTOM PROCEDURE TRAY) ×2 IMPLANT
KIT ROOM TURNOVER OR (KITS) ×2 IMPLANT
KWIRE 2.0X150M (WIRE) IMPLANT
MANIFOLD NEPTUNE II (INSTRUMENTS) ×2 IMPLANT
NS IRRIG 1000ML POUR BTL (IV SOLUTION) ×2 IMPLANT
PACK ORTHO EXTREMITY (CUSTOM PROCEDURE TRAY) ×2 IMPLANT
PAD ARMBOARD 7.5X6 YLW CONV (MISCELLANEOUS) ×4 IMPLANT
PAD CAST 4YDX4 CTTN HI CHSV (CAST SUPPLIES) ×2 IMPLANT
PADDING CAST COTTON 4X4 STRL (CAST SUPPLIES) ×4
PADDING CAST COTTON 6X4 STRL (CAST SUPPLIES) ×1 IMPLANT
PLATE 4.5MM CONDY 6H 170MM RT (Plate) ×1 IMPLANT
PROS LCP PLATE 6H 85MM (Plate) ×2 IMPLANT
PROSTHESIS LCP PLATE 6H 85MM (Plate) IMPLANT
SCREW 7.3 CANN CON PT THD 70 (Screw) ×1 IMPLANT
SCREW 7.3 CANN LK 70 (Screw) ×1 IMPLANT
SCREW CANN LOCK 5.0X75 (Screw) ×2 IMPLANT
SCREW CANN LOCK 5.0X80 (Screw) ×1 IMPLANT
SCREW CORT 3.5X48M SELF TAP (Screw) ×1 IMPLANT
SCREW CORTEX 3.5 34MM (Screw) ×1 IMPLANT
SCREW CORTEX 3.5 36MM (Screw) ×1 IMPLANT
SCREW CORTEX 3.5 50MM (Screw) ×1 IMPLANT
SCREW CORTEX ST 4.5X32 (Screw) ×1 IMPLANT
SCREW CORTEX ST 4.5X36 (Screw) ×2 IMPLANT
SCREW LOCK CORT ST 3.5X34 (Screw) IMPLANT
SCREW LOCK CORT ST 3.5X36 (Screw) IMPLANT
SCREW LOCK T15 FT 20X3.5XST (Screw) IMPLANT
SCREW LOCKING 3.5X20 (Screw) ×2 IMPLANT
SCREW LOCKING 3.5X50 (Screw) ×2 IMPLANT
SCREW LOCKING 5.0MM 35MM (Screw) ×1 IMPLANT
SPONGE GAUZE 4X4 12PLY (GAUZE/BANDAGES/DRESSINGS) ×2 IMPLANT
SPONGE LAP 4X18 X RAY DECT (DISPOSABLE) ×4 IMPLANT
STAPLER VISISTAT 35W (STAPLE) ×2 IMPLANT
STRIP CLOSURE SKIN 1/2X4 (GAUZE/BANDAGES/DRESSINGS) ×2 IMPLANT
SUCTION FRAZIER TIP 10 FR DISP (SUCTIONS) ×2 IMPLANT
SUT MNCRL AB 4-0 PS2 18 (SUTURE) ×2 IMPLANT
SUT VIC AB 1 CT1 27 (SUTURE) ×6
SUT VIC AB 1 CT1 27XBRD ANBCTR (SUTURE) IMPLANT
SUT VIC AB 2-0 CT1 18 (SUTURE) ×1 IMPLANT
SUT VIC AB 3-0 SH 18 (SUTURE) ×1 IMPLANT
SYR CONTROL 10ML LL (SYRINGE) IMPLANT
TOWEL OR 17X24 6PK STRL BLUE (TOWEL DISPOSABLE) ×2 IMPLANT
TOWEL OR 17X26 10 PK STRL BLUE (TOWEL DISPOSABLE) ×2 IMPLANT
TUBE CONNECTING 12X1/4 (SUCTIONS) ×2 IMPLANT
UNDERPAD 30X30 INCONTINENT (UNDERPADS AND DIAPERS) ×2 IMPLANT
WATER STERILE IRR 1000ML POUR (IV SOLUTION) ×2 IMPLANT
YANKAUER SUCT BULB TIP NO VENT (SUCTIONS) IMPLANT

## 2012-12-18 NOTE — Anesthesia Procedure Notes (Signed)
Procedure Name: Intubation Date/Time: 12/18/2012 9:33 AM Performed by: Charm Barges, Tong Pieczynski R Pre-anesthesia Checklist: Patient identified, Emergency Drugs available, Suction available, Patient being monitored and Timeout performed Patient Re-evaluated:Patient Re-evaluated prior to inductionOxygen Delivery Method: Circle system utilized Preoxygenation: Pre-oxygenation with 100% oxygen Intubation Type: IV induction Laryngoscope Size: Mac and 3 Grade View: Grade II Tube type: Oral Tube size: 7.5 mm Number of attempts: 1 Airway Equipment and Method: Stylet Placement Confirmation: ETT inserted through vocal cords under direct vision,  positive ETCO2 and breath sounds checked- equal and bilateral Secured at: 22 cm Tube secured with: Tape Dental Injury: Teeth and Oropharynx as per pre-operative assessment
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
## 2012-12-18 NOTE — Progress Notes (Signed)
3F urinary catheter placed per MD order.  Clear amber urine return.  Patient tolerated well. Steele Berg RN

## 2012-12-18 NOTE — Progress Notes (Signed)
Pt refusing to keep O2 in nose, pulse ox 100% on RA.
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
## 2012-12-18 NOTE — Progress Notes (Signed)
Pt refused her post-op CBC draw d/t recent news of family member's death and being "a hard stick".

## 2012-12-18 NOTE — Progress Notes (Signed)
Wasted 1/2 tablet of Methadone 10 mg with [employer] Must. Medication wasted in sharp container.
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
## 2012-12-18 NOTE — Anesthesia Postprocedure Evaluation (Signed)
Anesthesia Post Note  Patient: Mary Long  Procedure(s) Performed: Procedure(s) (LRB): OPEN REDUCTION INTERNAL FIXATION (ORIF) DISTAL FEMUR FRACTURE (Right)  Anesthesia type: general  Patient location: PACU  Post pain: Pain level controlled  Post assessment: Patient's Cardiovascular Status Stable  Last Vitals:  Filed Vitals:   12/18/12 1400  BP:   Pulse:   Temp: 36.5 C  Resp:     Post vital signs: Reviewed and stable  Level of consciousness: sedated  Complications: No apparent anesthesia complications

## 2012-12-18 NOTE — Preoperative (Signed)
Beta Blockers   Reason not to administer Beta Blockers:Not Applicable 

## 2012-12-18 NOTE — Anesthesia Preprocedure Evaluation (Addendum)
Anesthesia Evaluation  Patient identified by MRN, date of birth, ID band Patient awake    Reviewed: Allergy & Precautions, H&P , NPO status , Patient's Chart, lab work & pertinent test results  Airway Mallampati: I TM Distance: >3 FB Neck ROM: Full    Dental  (+) Teeth Intact   Pulmonary          Cardiovascular     Neuro/Psych Seizures -, Well Controlled,  Depression Bipolar Disorder IV Drug user on Methadone 65mg  Qday PO currently. Verified with [facility] clinic [phone])   GI/Hepatic (+) Hepatitis -, C  Endo/Other    Renal/GU      Musculoskeletal   Abdominal   Peds  Hematology   Anesthesia Other Findings   Reproductive/Obstetrics                          Anesthesia Physical Anesthesia Plan  ASA: III  Anesthesia Plan: General   Post-op Pain Management:    Induction: Intravenous  Airway Management Planned: Oral ETT  Additional Equipment:   Intra-op Plan:   Post-operative Plan: Extubation in OR  Informed Consent: I have reviewed the patients History and Physical, chart, labs and discussed the procedure including the risks, benefits and alternatives for the proposed anesthesia with the patient or authorized representative who has indicated his/her understanding and acceptance.   Dental advisory given  Plan Discussed with: CRNA and Surgeon  Anesthesia Plan Comments:        Anesthesia Quick Evaluation

## 2012-12-19 LAB — BASIC METABOLIC PANEL
BUN: 3 mg/dL — ABNORMAL LOW (ref 6–23)
CO2: 25 mEq/L (ref 19–32)
Calcium: 8.5 mg/dL (ref 8.4–10.5)
GFR calc non Af Amer: >90 mL/min (ref >90)
Glucose, Bld: 95 mg/dL (ref 70–99)
Potassium: 4 mEq/L (ref 3.5–5.1)

## 2012-12-19 LAB — CBC
HCT: 27.3 % — ABNORMAL LOW (ref 36.0–46.0)
Hemoglobin: 8.7 g/dL — ABNORMAL LOW (ref 12.0–15.0)
MCH: 24.2 pg — ABNORMAL LOW (ref 26.0–34.0)
MCHC: 31.9 g/dL (ref 30.0–36.0)
RBC: 3.6 MIL/uL — ABNORMAL LOW (ref 3.87–5.11)
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
## 2012-12-19 NOTE — Progress Notes (Addendum)
   CARE MANAGEMENT NOTE 12/19/2012  Patient:  Mary Long, Mary Long   Account Number:  [id number]  Date Initiated:  12/19/2012  Documentation initiated by:  [facility]  Subjective/Objective Assessment:   ORIF femur     Action/Plan:   HH needed   Anticipated DC Date:  12/19/2012   Anticipated DC Plan:  HOME W HOME HEALTH SERVICES      DC Planning Services  CM consult      [facility] Choice  HOME HEALTH   Choice offered to / List presented to:  C-1 Patient   DME arranged  3-N-1  WALKER - ROLLING  SHOWER STOOL      DME agency  [facility]     HH arranged  HH-2 PT      [facility] agency  [facility]   Status of service:  Completed, signed off Medicare Important Message given?   (If response is "NO", the following Medicare IM given date fields will be blank) Date Medicare IM given:   Date Additional Medicare IM given:    Discharge Disposition:  HOME W HOME HEALTH SERVICES  Per UR Regulation:    If discussed at Long Length of Stay Meetings, dates discussed:    Comments:  12/19/2012 1630 NCM spoke to pt and states she has [place] for [facility]. Message sent to [facility] rep for [facility]. Pt states she wants to dc home today. She will have more assistance at home this evening.  Isidoro Donning RN CCM Case Mgmt phone [phone]
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
## 2012-12-19 NOTE — Progress Notes (Signed)
Patient ID: Mary Long, female   DOB: 1976-01-25, 37 y.o.   MRN: [id number]     Subjective:  Patient reports pain as mild to moderate.  Patient alert and answers all questions and follows all commands.  Objective:   VITALS:   Filed Vitals:   12/18/12 1649 12/18/12 2230 12/19/12 0647 12/19/12 0745  BP:  108/68 120/74   Pulse:  115 105   Temp:  98.7 F (37.1 C) 99.5 F (37.5 C)   TempSrc:      Resp: 16 18 18 20   SpO2: 100% 100% 99% 98%    ABD soft Sensation intact distally Dorsiflexion/Plantar flexion intact Incision: dressing C/D/I and no drainage   Lab Results  Component Value Date   WBC 8.1 12/19/2012   HGB 8.7* 12/19/2012   HCT 27.3* 12/19/2012   MCV 75.8* 12/19/2012   PLT 309 12/19/2012     Assessment/Plan: 1 Day Post-Op   Active Problems:   Fracture of distal femur   Advance diet Up with therapy NWB right lower ext. ABLA continue to monitor Plan for DC tomorrow but could go today if passes PT   Mary Long 12/19/2012, 9:28 AM   Teryl Lucy, MD Cell [phone]

## 2012-12-19 NOTE — Progress Notes (Signed)
Fracture of distal femur  Assessment: Improved, no evidence of abd or chest injury.   Plan: Discharge per ortho   Subjective: Feels OK, denies abd or chest trauma or pain  Objective: Vital signs in last 24 hours: Temp:  [97.7 F (36.5 C)-99.5 F (37.5 C)] 99.5 F (37.5 C) (06/08 0647) Pulse Rate:  [77-115] 105 (06/08 0647) Resp:  [14-20] 20 (06/08 0745) BP: (108-153)/(68-90) 120/74 mmHg (06/08 0647) SpO2:  [98 %-100 %] 98 % (06/08 0745) Last BM Date: 12/17/12  Intake/Output from previous day: 06/07 0701 - 06/08 0700 In: 3011.3 [P.O.:480; I.V.:2431.3; IV Piggyback:100] Out: 4800 [Urine:4000; Blood:800]  General appearance: alert, cooperative and no distress Resp: clear to auscultation bilaterally GI: soft, non-tender; bowel sounds normal; no masses,  no organomegaly  Lab Results:  Results for orders placed during the hospital encounter of 12/17/12 (from the past 24 hour(s))  TYPE AND SCREEN     Status: None   Collection Time    12/18/12 10:20 AM      Result Value Range   ABO/RH(D) A POS     Antibody Screen NEG     Sample Expiration 12/21/2012     Unit Number [id number]     Blood Component Type RED CELLS,LR     Unit division 00     Status of Unit ALLOCATED     Transfusion Status OK TO TRANSFUSE     Crossmatch Result Compatible     Unit Number [id number]     Blood Component Type RED CELLS,LR     Unit division 00     Status of Unit ALLOCATED     Transfusion Status OK TO TRANSFUSE     Crossmatch Result Compatible    PREPARE RBC (CROSSMATCH)     Status: None   Collection Time    12/18/12 10:20 AM      Result Value Range   Order Confirmation ORDER PROCESSED BY BLOOD BANK    POCT I-STAT 4, (NA,K, GLUC, HGB,HCT)     Status: Abnormal   Collection Time    12/18/12 10:37 AM      Result Value Range   Sodium 141  135 - 145 mEq/L   Potassium 2.6 (*) 3.5 - 5.1 mEq/L   Glucose, Bld 113 (*) 70 - 99 mg/dL   HCT 30.8 (*) 65.7 - 84.6 %   Hemoglobin 8.8 (*) 12.0 -  15.0 g/dL   Comment NOTIFIED PHYSICIAN    APTT     Status: None   Collection Time    12/18/12 10:43 AM      Result Value Range   aPTT 24  24 - 37 seconds  PROTIME-INR     Status: None   Collection Time    12/18/12 10:43 AM      Result Value Range   Prothrombin Time 13.5  11.6 - 15.2 seconds   INR 1.04  0.00 - 1.49  CBC     Status: Abnormal   Collection Time    12/18/12  5:56 PM      Result Value Range   WBC 11.9 (*) 4.0 - 10.5 K/uL   RBC 3.75 (*) 3.87 - 5.11 MIL/uL   Hemoglobin 9.1 (*) 12.0 - 15.0 g/dL   HCT 96.2 (*) 95.2 - 84.1 %   MCV 74.4 (*) 78.0 - 100.0 fL   MCH 24.3 (*) 26.0 - 34.0 pg   MCHC 32.6  30.0 - 36.0 g/dL   RDW 32.4 (*) 40.1 - 02.7 %   Platelets 291  150 - 400 K/uL  BASIC METABOLIC PANEL     Status: Abnormal   Collection Time    12/18/12  5:56 PM      Result Value Range   Sodium 134 (*) 135 - 145 mEq/L   Potassium 3.9  3.5 - 5.1 mEq/L   Chloride 104  96 - 112 mEq/L   CO2 25  19 - 32 mEq/L   Glucose, Bld 125 (*) 70 - 99 mg/dL   BUN <3 (*) 6 - 23 mg/dL   Creatinine, Ser 1.61 (*) 0.50 - 1.10 mg/dL   Calcium 8.4  8.4 - 09.6 mg/dL   GFR calc non Af Amer >90  >90 mL/min   GFR calc Af Amer >90  >90 mL/min  CBC     Status: Abnormal   Collection Time    12/19/12  4:10 AM      Result Value Range   WBC 8.1  4.0 - 10.5 K/uL   RBC 3.60 (*) 3.87 - 5.11 MIL/uL   Hemoglobin 8.7 (*) 12.0 - 15.0 g/dL   HCT 04.5 (*) 40.9 - 81.1 %   MCV 75.8 (*) 78.0 - 100.0 fL   MCH 24.2 (*) 26.0 - 34.0 pg   MCHC 31.9  30.0 - 36.0 g/dL   RDW 91.4 (*) 78.2 - 95.6 %   Platelets 309  150 - 400 K/uL  BASIC METABOLIC PANEL     Status: Abnormal   Collection Time    12/19/12  4:10 AM      Result Value Range   Sodium 134 (*) 135 - 145 mEq/L   Potassium 4.0  3.5 - 5.1 mEq/L   Chloride 103  96 - 112 mEq/L   CO2 25  19 - 32 mEq/L   Glucose, Bld 95  70 - 99 mg/dL   BUN 3 (*) 6 - 23 mg/dL   Creatinine, Ser 2.13  0.50 - 1.10 mg/dL   Calcium 8.5  8.4 - 08.6 mg/dL   GFR calc non Af Amer  >90  >90 mL/min   GFR calc Af Amer >90  >90 mL/min     Studies/Results Radiology     MEDS, Scheduled . ALPRAZolam  1 mg Oral BID  . amphetamine-dextroamphetamine  20 mg Oral Daily  . buPROPion  100 mg Oral BID AC  . Chlorhexidine Gluconate Cloth  6 each Topical Q0600  . docusate sodium  100 mg Oral BID  . methadone  65 mg Oral Daily  . mupirocin ointment  1 application Nasal BID  . potassium chloride  40 mEq Oral BID  . senna  1 tablet Oral BID       LOS: 2 days    Currie Paris, MD, [facility], [place] [phone]   12/19/2012 9:38 AM

## 2012-12-19 NOTE — Progress Notes (Signed)
After pt spoke with SW and case manager, pt requesting to be discharged today d/t having help at home this afternoon compared to tomorrow afternoon.  Encouraged pt to stay one addition night to have PT work with her tomorrow.  Pt still adamant about going home tonight.  Dr. Dion Saucier aware and gave order for pt to be discharged home with home health, walker and 3-1.

## 2012-12-19 NOTE — Clinical Social Work Psychosocial (Signed)
Clinical Social Work Department BRIEF PSYCHOSOCIAL ASSESSMENT 12/19/2012  Patient:  SHARAI, OVERBAY     Account Number:  [id number]     Admit date:  12/17/2012  Clinical Social Worker:  Demetrios Loll  Date/Time:  12/19/2012 05:37 PM  Referred by:  Physician  Date Referred:  12/19/2012 Referred for  SNF Placement   Other Referral:   Interview type:  Patient Other interview type:    PSYCHOSOCIAL DATA Living Status:  FRIEND(S) Admitted from facility:   Level of care:   Primary support name:  Beale,Everette Dimauro Primary support relationship to patient:  PARENT Degree of support available:   unknown    CURRENT CONCERNS Current Concerns  None Noted   Other Concerns:    SOCIAL WORK ASSESSMENT / PLAN CSW received consult for SNF. PT is recommending HHPT at discharge. CSW met with pt and her boyfriend to address consult. CSW introduced herself and explained role of social work. CSW declined SNF and would like to go home like recommended by PT. Pt shared that she would like to discharge home today. CSW updated RN and RNCM of pt's wishes. CSW referred pt to [facility] of HHPT. Pt shared that has had [facility] for home health in the past. CSW is signing off as no further needs identified.   Assessment/plan status:  Psychosocial Support/Ongoing Assessment of Needs Other assessment/ plan:   Information/referral to community resources:   none    PATIENT'S/FAMILY'S RESPONSE TO PLAN OF CARE: Pt was pleasant, however eager to discharge home today.     Dede Query, MSW, LCSW Clinical Social Worker Weekend Coverage

## 2012-12-20 ENCOUNTER — Encounter (HOSPITAL_COMMUNITY): Payer: Self-pay | Admitting: Orthopedic Surgery

## 2012-12-20 LAB — TYPE AND SCREEN
ABO/RH(D): A POS
Antibody Screen: NEGATIVE
Unit division: 0

## 2012-12-20 NOTE — Transfer of Care (Addendum)
Immediate Anesthesia Transfer of Care Note  Patient: Mary Long  Procedure(s) Performed: Procedure(s): OPEN REDUCTION INTERNAL FIXATION (ORIF) DISTAL FEMUR FRACTURE (Right)  Transfer of Care by D. Charm Barges
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
## 2012-12-20 NOTE — Addendum Note (Signed)
Addendum created 12/20/12 1624 by Shireen Quan, CRNA   Modules edited: Anesthesia Events

## 2012-12-21 NOTE — ED Provider Notes (Signed)
Medical screening examination/treatment/procedure(s) were performed by non-physician practitioner and as supervising physician I was immediately available for consultation/collaboration.  Toy Baker, MD 12/21/12 2006

## 2014-01-21 ENCOUNTER — Other Ambulatory Visit: Payer: Self-pay | Admitting: Orthopedic Surgery

## 2014-01-25 ENCOUNTER — Other Ambulatory Visit (HOSPITAL_COMMUNITY): Payer: Self-pay | Admitting: *Deleted

## 2014-01-25 ENCOUNTER — Inpatient Hospital Stay (HOSPITAL_COMMUNITY)
Admission: RE | Admit: 2014-01-25 | Discharge: 2014-01-25 | Disposition: A | Discharge status: Home / Self Care | Payer: Medicare Other | Source: Ambulatory Visit

## 2014-01-25 NOTE — Pre-Procedure Instructions (Signed)
Mary Long  01/25/2014   Your procedure is scheduled on:  Friday, February 03, 2014 at 7:30 AM.   Report to [facility] Entrance "A" Admitting Office at 5:30 AM.   Call this number if you have problems the morning of surgery: [phone]   Remember:   Do not eat food or drink liquids after midnight Thursday, 02/02/14.   Take these medicines the morning of surgery with A SIP OF WATER:     Do not wear jewelry, make-up or nail polish.  Do not wear lotions, powders, or perfumes. You may wear deodorant.  Do not shave 48 hours prior to surgery.   Do not bring valuables to the hospital.  [facility] is not responsible                  for any belongings or valuables.               Contacts, dentures or bridgework may not be worn into surgery.  Leave suitcase in the car. After surgery it may be brought to your room.  For patients admitted to the hospital, discharge time is determined by your                treatment team.               Special Instructions:  - Preparing for Surgery  Before surgery, you can play an important role.  Because skin is not sterile, your skin needs to be as free of germs as possible.  You can reduce the number of germs on you skin by washing with CHG (chlorahexidine gluconate) soap before surgery.  CHG is an antiseptic cleaner which kills germs and bonds with the skin to continue killing germs even after washing.  Please DO NOT use if you have an allergy to CHG or antibacterial soaps.  If your skin becomes reddened/irritated stop using the CHG and inform your nurse when you arrive at Short Stay.  Do not shave (including legs and underarms) for at least 48 hours prior to the first CHG shower.  You may shave your face.  Please follow these instructions carefully:   1.  Shower with CHG Soap the night before surgery and the                                morning of Surgery.  2.  If you choose to wash your hair, wash your hair first as usual with  your       normal shampoo.  3.  After you shampoo, rinse your hair and body thoroughly to remove the                      Shampoo.  4.  Use CHG as you would any other liquid soap.  You can apply chg directly       to the skin and wash gently with scrungie or a clean washcloth.  5.  Apply the CHG Soap to your body ONLY FROM THE NECK DOWN.        Do not use on open wounds or open sores.  Avoid contact with your eyes, ears, mouth and genitals (private parts).  Wash genitals (private parts) with your normal soap.  6.  Wash thoroughly, paying special attention to the area where your surgery        will be performed.  7.  Thoroughly  rinse your body with warm water from the neck down.  8.  DO NOT shower/wash with your normal soap after using and rinsing off       the CHG Soap.  9.  Pat yourself dry with a clean towel.            10.  Wear clean pajamas.            11.  Place clean sheets on your bed the night of your first shower and do not        sleep with pets.  Day of Surgery  Do not apply any lotions the morning of surgery.  Please wear clean clothes to the hospital/surgery center.     Please read over the following fact sheets that you were given: Pain Booklet, Coughing and Deep Breathing, MRSA Information and Surgical Site Infection Prevention

## 2014-01-27 ENCOUNTER — Encounter (HOSPITAL_COMMUNITY): Payer: Self-pay

## 2014-01-27 ENCOUNTER — Encounter (HOSPITAL_COMMUNITY)
Admission: RE | Admit: 2014-01-27 | Discharge: 2014-01-27 | Disposition: A | Discharge status: Home / Self Care | Payer: Medicare Other | Source: Ambulatory Visit | Attending: Orthopedic Surgery | Admitting: Orthopedic Surgery

## 2014-01-27 ENCOUNTER — Encounter (HOSPITAL_COMMUNITY): Payer: Self-pay | Admitting: Pharmacy Technician

## 2014-01-27 DIAGNOSIS — Z01818 Encounter for other preprocedural examination: Secondary | ICD-10-CM | POA: Diagnosis not present

## 2014-01-27 DIAGNOSIS — Z01812 Encounter for preprocedural laboratory examination: Secondary | ICD-10-CM | POA: Insufficient documentation

## 2014-01-27 HISTORY — DX: Other complications of anesthesia, initial encounter: T88.59XA

## 2014-01-27 HISTORY — DX: Blindness, one eye, unspecified eye: H54.40

## 2014-01-27 HISTORY — DX: Adverse effect of unspecified anesthetic, initial encounter: T41.45XA

## 2014-01-27 LAB — SURGICAL PCR SCREEN
MRSA, PCR: POSITIVE — AB
Staphylococcus aureus: POSITIVE — AB

## 2014-01-27 NOTE — Pre-Procedure Instructions (Signed)
Mary Long  01/27/2014   Your procedure is scheduled on:  Friday, July 24  Report to [facility] Admitting at 0530 AM.  Call this number if you have problems the morning of surgery: [phone]   Remember:   Do not eat food or drink liquids after midnight.Thursday July 23   Take these medicines the morning of surgery with A SIP OF WATER: Alprazolam,Methadone, Symbyax   Do not wear jewelry, make-up or nail polish.  Do not wear lotions, powders, or perfumes. You may Not wear deodorant.the day of surgery.  Do not shave 48 hours prior to surgery. Men may shave face and neck.  Do not bring valuables to the hospital.  [facility] is not responsible    for any belongings or valuables.               Contacts, dentures or bridgework may not be worn into surgery.  Leave suitcase in the car. After surgery it may be brought to your room.  For patients admitted to the hospital, discharge time is determined by your   treatment team.               Special Instructions: [place] - Preparing for Surgery  Before surgery, you can play an important role.  Because skin is not sterile, your skin needs to be as free of germs as possible.  You can reduce the number of germs on you skin by washing with CHG (chlorahexidine gluconate) soap before surgery.  CHG is an antiseptic cleaner which kills germs and bonds with the skin to continue killing germs even after washing.  Please DO NOT use if you have an allergy to CHG or antibacterial soaps.  If your skin becomes reddened/irritated stop using the CHG and inform your nurse when you arrive at Short Stay.  Do not shave (including legs and underarms) for at least 48 hours prior to the first CHG shower.  You may shave your face.  Please follow these instructions carefully:   1.  Shower with CHG Soap the night before surgery and the     morning of Surgery.  2.  If you choose to wash your hair, wash your hair first as usual with your  normal  shampoo.  3.  After you shampoo, rinse your hair and body thoroughly to remove the    Shampoo.  4.  Use CHG as you would any other liquid soap.  You can apply chg directly   to the skin and wash gently with scrungie or a clean washcloth.  5.  Apply the CHG Soap to your body ONLY FROM THE NECK DOWN.    Do not use on open wounds or open sores.  Avoid contact with your eyes,   ears, mouth and genitals (private parts).  Wash genitals (private parts)   with your normal soap.  6.  Wash thoroughly, paying special attention to the area where your surgery   will be performed.  7.  Thoroughly rinse your body with warm water from the neck down.  8.  DO NOT shower/wash with your normal soap after using and rinsing off   the CHG Soap.  9.  Pat yourself dry with a clean towel.            10.  Wear clean pajamas.            11.  Place clean sheets on your bed the night of your first shower and do no sleep  with pets.  Day of Surgery  Do not apply any lotions/deoderants the morning of surgery.  Please wear clean clothes to the hospital/surgery center.     Please read over the following fact sheets that you were given: Pain Booklet, Coughing and Deep Breathing and Surgical Site Infection Prevention

## 2014-01-31 ENCOUNTER — Other Ambulatory Visit: Payer: Self-pay | Admitting: Orthopedic Surgery

## 2014-01-31 MED ORDER — VANCOMYCIN HCL 10 G IV SOLR
1000.0000 mg | Freq: Once | INTRAVENOUS | Status: AC
  Administered 2017-01-06: 1000 mg via INTRAVENOUS

## 2014-02-02 MED ORDER — CEFAZOLIN SODIUM-DEXTROSE 2-3 GM-% IV SOLR
2.0000 g | INTRAVENOUS | Status: AC
  Administered 2014-02-03: 2 g via INTRAVENOUS
  Filled 2014-02-02: qty 50

## 2014-02-02 NOTE — Progress Notes (Signed)
Pt called regarding time change and to arrive at 0730 for 0930 surgery with understanding verbalized.

## 2014-02-03 ENCOUNTER — Encounter (HOSPITAL_COMMUNITY): Payer: Medicare Other | Admitting: Certified Registered"

## 2014-02-03 ENCOUNTER — Inpatient Hospital Stay (HOSPITAL_COMMUNITY)
Admission: RE | Admit: 2014-02-03 | Discharge: 2014-02-06 | DRG: 475 | Disposition: A | Discharge status: Home Health | Payer: Medicare Other | Source: Ambulatory Visit | Attending: Orthopedic Surgery | Admitting: Orthopedic Surgery

## 2014-02-03 ENCOUNTER — Ambulatory Visit (HOSPITAL_BASED_OUTPATIENT_CLINIC_OR_DEPARTMENT_OTHER): Admit: 2014-02-03 | Payer: Self-pay | Admitting: Orthopedic Surgery

## 2014-02-03 ENCOUNTER — Ambulatory Visit (HOSPITAL_COMMUNITY): Payer: Medicare Other | Admitting: Certified Registered"

## 2014-02-03 ENCOUNTER — Encounter (HOSPITAL_COMMUNITY)
Admission: RE | Disposition: A | Discharge status: Home Health | Payer: Self-pay | Source: Ambulatory Visit | Attending: Orthopedic Surgery

## 2014-02-03 ENCOUNTER — Encounter (HOSPITAL_BASED_OUTPATIENT_CLINIC_OR_DEPARTMENT_OTHER): Payer: Self-pay

## 2014-02-03 ENCOUNTER — Encounter (HOSPITAL_COMMUNITY): Payer: Self-pay | Admitting: *Deleted

## 2014-02-03 ENCOUNTER — Inpatient Hospital Stay (HOSPITAL_COMMUNITY): Payer: Medicare Other

## 2014-02-03 DIAGNOSIS — G589 Mononeuropathy, unspecified: Secondary | ICD-10-CM | POA: Diagnosis present

## 2014-02-03 DIAGNOSIS — F3130 Bipolar disorder, current episode depressed, mild or moderate severity, unspecified: Secondary | ICD-10-CM | POA: Diagnosis present

## 2014-02-03 DIAGNOSIS — M8569 Other cyst of bone, multiple sites: Secondary | ICD-10-CM | POA: Diagnosis present

## 2014-02-03 DIAGNOSIS — G8929 Other chronic pain: Secondary | ICD-10-CM | POA: Diagnosis present

## 2014-02-03 DIAGNOSIS — G8918 Other acute postprocedural pain: Secondary | ICD-10-CM | POA: Diagnosis not present

## 2014-02-03 DIAGNOSIS — R569 Unspecified convulsions: Secondary | ICD-10-CM | POA: Diagnosis present

## 2014-02-03 DIAGNOSIS — D62 Acute posthemorrhagic anemia: Secondary | ICD-10-CM | POA: Diagnosis not present

## 2014-02-03 DIAGNOSIS — Z79899 Other long term (current) drug therapy: Secondary | ICD-10-CM

## 2014-02-03 DIAGNOSIS — Z886 Allergy status to analgesic agent status: Secondary | ICD-10-CM

## 2014-02-03 DIAGNOSIS — F17200 Nicotine dependence, unspecified, uncomplicated: Secondary | ICD-10-CM | POA: Diagnosis present

## 2014-02-03 DIAGNOSIS — G894 Chronic pain syndrome: Secondary | ICD-10-CM

## 2014-02-03 DIAGNOSIS — M86661 Other chronic osteomyelitis, right tibia and fibula: Secondary | ICD-10-CM

## 2014-02-03 DIAGNOSIS — S8290XS Unspecified fracture of unspecified lower leg, sequela: Secondary | ICD-10-CM | POA: Diagnosis not present

## 2014-02-03 DIAGNOSIS — Z515 Encounter for palliative care: Secondary | ICD-10-CM

## 2014-02-03 DIAGNOSIS — M86669 Other chronic osteomyelitis, unspecified tibia and fibula: Secondary | ICD-10-CM | POA: Diagnosis present

## 2014-02-03 DIAGNOSIS — F319 Bipolar disorder, unspecified: Secondary | ICD-10-CM

## 2014-02-03 DIAGNOSIS — B182 Chronic viral hepatitis C: Secondary | ICD-10-CM | POA: Diagnosis present

## 2014-02-03 DIAGNOSIS — G547 Phantom limb syndrome without pain: Secondary | ICD-10-CM | POA: Diagnosis not present

## 2014-02-03 DIAGNOSIS — IMO0002 Reserved for concepts with insufficient information to code with codable children: Secondary | ICD-10-CM | POA: Diagnosis present

## 2014-02-03 DIAGNOSIS — A4902 Methicillin resistant Staphylococcus aureus infection, unspecified site: Secondary | ICD-10-CM | POA: Diagnosis present

## 2014-02-03 HISTORY — PX: AMPUTATION: SHX166

## 2014-02-03 HISTORY — DX: Other chronic osteomyelitis, right tibia and fibula: M86.661

## 2014-02-03 LAB — POCT I-STAT 4, (NA,K, GLUC, HGB,HCT)
GLUCOSE: 73 mg/dL (ref 70–99)
HEMATOCRIT: 41 % (ref 36.0–46.0)
Hemoglobin: 13.9 g/dL (ref 12.0–15.0)
POTASSIUM: 4.3 meq/L (ref 3.7–5.3)
SODIUM: 137 meq/L (ref 137–147)

## 2014-02-03 SURGERY — AMPUTATION BELOW KNEE
Anesthesia: General | Site: Leg Lower | Laterality: Right

## 2014-02-03 SURGERY — AMPUTATION BELOW KNEE
Anesthesia: General | Site: Knee | Laterality: Right

## 2014-02-03 MED ORDER — METHADONE HCL 10 MG PO TABS: 40.0000 mg | ORAL_TABLET | Freq: Two times a day (BID) | ORAL | Status: DC

## 2014-02-03 MED ORDER — DIAZEPAM 5 MG/ML IJ SOLN
INTRAMUSCULAR | Status: AC
  Administered 2014-02-03: 10 mg
  Filled 2014-02-03: qty 2

## 2014-02-03 MED ORDER — HYDROMORPHONE HCL PF 1 MG/ML IJ SOLN
0.5000 mg | INTRAMUSCULAR | Status: DC | PRN
Start: 2014-02-03 — End: 2014-02-03
  Administered 2014-02-03 (×2): 1 mg via INTRAVENOUS
  Filled 2014-02-03: qty 1

## 2014-02-03 MED ORDER — METHADONE HCL 10 MG PO TABS
40.0000 mg | ORAL_TABLET | Freq: Two times a day (BID) | ORAL | Status: DC
  Administered 2014-02-03 – 2014-02-06 (×7): 40 mg via ORAL
  Filled 2014-02-03 (×7): qty 4

## 2014-02-03 MED ORDER — MIDAZOLAM HCL 5 MG/5ML IJ SOLN
INTRAMUSCULAR | Status: DC | PRN
  Administered 2014-02-03: 2 mg via INTRAVENOUS

## 2014-02-03 MED ORDER — HYDROMORPHONE HCL PF 1 MG/ML IJ SOLN
0.2500 mg | INTRAMUSCULAR | Status: DC | PRN
Start: 2014-02-03 — End: 2014-02-03
  Administered 2014-02-03 (×4): 0.5 mg via INTRAVENOUS

## 2014-02-03 MED ORDER — VANCOMYCIN HCL IN DEXTROSE 1-5 GM/200ML-% IV SOLN
INTRAVENOUS | Status: AC
  Administered 2014-02-03: 250 mg via INTRAVENOUS
  Filled 2014-02-03: qty 200

## 2014-02-03 MED ORDER — METOCLOPRAMIDE HCL 5 MG PO TABS
5.0000 mg | ORAL_TABLET | Freq: Three times a day (TID) | ORAL | Status: DC | PRN
  Filled 2014-02-03: qty 2

## 2014-02-03 MED ORDER — ONDANSETRON HCL 4 MG PO TABS: 4.0000 mg | ORAL_TABLET | Freq: Four times a day (QID) | ORAL | Status: DC | PRN

## 2014-02-03 MED ORDER — HYDROMORPHONE 0.3 MG/ML IV SOLN
INTRAVENOUS | Status: DC
  Administered 2014-02-03 (×2): via INTRAVENOUS
  Administered 2014-02-03: 4.38 mg via INTRAVENOUS
  Administered 2014-02-04: 08:00:00 via INTRAVENOUS
  Administered 2014-02-04: 2.8 mg via INTRAVENOUS
  Administered 2014-02-04: 0.999 mg via INTRAVENOUS
  Administered 2014-02-04: 3.49 mg via INTRAVENOUS
  Administered 2014-02-04: 2.49 mg via INTRAVENOUS
  Administered 2014-02-04: 16:00:00 via INTRAVENOUS
  Administered 2014-02-04: 3.99 mg via INTRAVENOUS
  Administered 2014-02-05: 3.46 mg via INTRAVENOUS
  Administered 2014-02-05: 0.5 mg via INTRAVENOUS
  Administered 2014-02-05: 3.99 mg via INTRAVENOUS
  Administered 2014-02-05: 2.49 mg via INTRAVENOUS
  Administered 2014-02-05: 1.3 mg via INTRAVENOUS
  Filled 2014-02-03 (×5): qty 25

## 2014-02-03 MED ORDER — ALPRAZOLAM 0.5 MG PO TABS
1.0000 mg | ORAL_TABLET | Freq: Three times a day (TID) | ORAL | Status: DC
  Administered 2014-02-04 – 2014-02-06 (×7): 1 mg via ORAL
  Filled 2014-02-03 (×7): qty 2

## 2014-02-03 MED ORDER — DIPHENHYDRAMINE HCL 12.5 MG/5ML PO ELIX: 12.5000 mg | ORAL_SOLUTION | Freq: Four times a day (QID) | ORAL | Status: DC | PRN

## 2014-02-03 MED ORDER — FLUOXETINE HCL 20 MG PO TABS
20.0000 mg | ORAL_TABLET | Freq: Every day | ORAL | Status: DC
  Administered 2014-02-03 – 2014-02-05 (×3): 20 mg via ORAL
  Filled 2014-02-03 (×4): qty 1

## 2014-02-03 MED ORDER — METHADONE HCL 40 MG PO TBSO: 40.0000 mg | ORAL_TABLET | Freq: Two times a day (BID) | ORAL | Status: DC

## 2014-02-03 MED ORDER — OLANZAPINE 5 MG PO TABS
5.0000 mg | ORAL_TABLET | Freq: Every day | ORAL | Status: DC
  Administered 2014-02-03 – 2014-02-05 (×3): 5 mg via ORAL
  Filled 2014-02-03 (×4): qty 1

## 2014-02-03 MED ORDER — CEFAZOLIN SODIUM-DEXTROSE 2-3 GM-% IV SOLR
2.0000 g | Freq: Four times a day (QID) | INTRAVENOUS | Status: AC
  Administered 2014-02-03 – 2014-02-04 (×3): 2 g via INTRAVENOUS
  Filled 2014-02-03 (×4): qty 50

## 2014-02-03 MED ORDER — OXYCODONE HCL 5 MG PO TABS
ORAL_TABLET | ORAL | Status: AC
  Filled 2014-02-03: qty 2

## 2014-02-03 MED ORDER — LACTATED RINGERS IV SOLN
INTRAVENOUS | Status: DC | PRN
  Administered 2014-02-03 (×2): via INTRAVENOUS

## 2014-02-03 MED ORDER — METHOCARBAMOL 500 MG PO TABS
ORAL_TABLET | ORAL | Status: AC
  Filled 2014-02-03: qty 1

## 2014-02-03 MED ORDER — OXYCODONE HCL 5 MG PO TABS
5.0000 mg | ORAL_TABLET | ORAL | Status: DC | PRN
  Administered 2014-02-03 – 2014-02-04 (×6): 10 mg via ORAL
  Filled 2014-02-03 (×5): qty 2

## 2014-02-03 MED ORDER — ONDANSETRON HCL 4 MG/2ML IJ SOLN: 4.0000 mg | Freq: Four times a day (QID) | INTRAMUSCULAR | Status: DC | PRN

## 2014-02-03 MED ORDER — DIPHENHYDRAMINE HCL 12.5 MG/5ML PO ELIX: 12.5000 mg | ORAL_SOLUTION | ORAL | Status: DC | PRN

## 2014-02-03 MED ORDER — 0.9 % SODIUM CHLORIDE (POUR BTL) OPTIME
TOPICAL | Status: DC | PRN
  Administered 2014-02-03: 1000 mL

## 2014-02-03 MED ORDER — METHOCARBAMOL 1000 MG/10ML IJ SOLN
500.0000 mg | Freq: Four times a day (QID) | INTRAVENOUS | Status: DC | PRN
  Filled 2014-02-03: qty 5

## 2014-02-03 MED ORDER — LACTATED RINGERS IV SOLN: INTRAVENOUS | Status: DC

## 2014-02-03 MED ORDER — SENNA 8.6 MG PO TABS
1.0000 | ORAL_TABLET | Freq: Two times a day (BID) | ORAL | Status: DC
  Administered 2014-02-03 – 2014-02-06 (×6): 8.6 mg via ORAL
  Filled 2014-02-03 (×8): qty 1

## 2014-02-03 MED ORDER — HYDROMORPHONE 0.3 MG/ML IV SOLN: INTRAVENOUS | Status: DC

## 2014-02-03 MED ORDER — DOCUSATE SODIUM 100 MG PO CAPS
100.0000 mg | ORAL_CAPSULE | Freq: Two times a day (BID) | ORAL | Status: DC
  Administered 2014-02-03 – 2014-02-06 (×6): 100 mg via ORAL
  Filled 2014-02-03 (×7): qty 1

## 2014-02-03 MED ORDER — VANCOMYCIN HCL IN DEXTROSE 1-5 GM/200ML-% IV SOLN
INTRAVENOUS | Status: AC
  Filled 2014-02-03: qty 200

## 2014-02-03 MED ORDER — METOCLOPRAMIDE HCL 5 MG/ML IJ SOLN: 5.0000 mg | Freq: Three times a day (TID) | INTRAMUSCULAR | Status: DC | PRN

## 2014-02-03 MED ORDER — POTASSIUM CHLORIDE IN NACL 20-0.45 MEQ/L-% IV SOLN
INTRAVENOUS | Status: DC
  Administered 2014-02-03 – 2014-02-05 (×3): via INTRAVENOUS
  Filled 2014-02-03 (×6): qty 1000

## 2014-02-03 MED ORDER — HYDROMORPHONE HCL PF 1 MG/ML IJ SOLN
INTRAMUSCULAR | Status: AC
  Administered 2014-02-03: 4 mg
  Filled 2014-02-03: qty 4

## 2014-02-03 MED ORDER — FENTANYL CITRATE 0.05 MG/ML IJ SOLN
INTRAMUSCULAR | Status: AC
  Filled 2014-02-03: qty 5

## 2014-02-03 MED ORDER — ZOLPIDEM TARTRATE 10 MG PO TABS: 10.0000 mg | ORAL_TABLET | Freq: Every evening | ORAL | Status: DC | PRN

## 2014-02-03 MED ORDER — SENNA-DOCUSATE SODIUM 8.6-50 MG PO TABS: 2.0000 | ORAL_TABLET | Freq: Every day | ORAL | Status: DC

## 2014-02-03 MED ORDER — GABAPENTIN 600 MG PO TABS
300.0000 mg | ORAL_TABLET | Freq: Three times a day (TID) | ORAL | Status: DC
  Filled 2014-02-03 (×2): qty 0.5

## 2014-02-03 MED ORDER — LIDOCAINE HCL (CARDIAC) 20 MG/ML IV SOLN
INTRAVENOUS | Status: DC | PRN
  Administered 2014-02-03: 60 mg via INTRAVENOUS

## 2014-02-03 MED ORDER — BISACODYL 10 MG RE SUPP: 10.0000 mg | Freq: Every day | RECTAL | Status: DC | PRN

## 2014-02-03 MED ORDER — OXYCODONE-ACETAMINOPHEN 5-325 MG PO TABS
1.0000 | ORAL_TABLET | ORAL | Status: DC | PRN
  Administered 2014-02-03 (×2): 2 via ORAL
  Filled 2014-02-03: qty 2
  Filled 2014-02-03 (×2): qty 1

## 2014-02-03 MED ORDER — DIPHENHYDRAMINE HCL 50 MG/ML IJ SOLN: 12.5000 mg | Freq: Four times a day (QID) | INTRAMUSCULAR | Status: DC | PRN

## 2014-02-03 MED ORDER — SODIUM CHLORIDE 0.9 % IJ SOLN: 9.0000 mL | INTRAMUSCULAR | Status: DC | PRN

## 2014-02-03 MED ORDER — NALOXONE HCL 0.4 MG/ML IJ SOLN: 0.4000 mg | INTRAMUSCULAR | Status: DC | PRN

## 2014-02-03 MED ORDER — MIDAZOLAM HCL 2 MG/2ML IJ SOLN
INTRAMUSCULAR | Status: AC
  Filled 2014-02-03: qty 2

## 2014-02-03 MED ORDER — MIDAZOLAM HCL 2 MG/2ML IJ SOLN
INTRAMUSCULAR | Status: AC
  Administered 2014-02-03: 0.5 mg
  Filled 2014-02-03: qty 2

## 2014-02-03 MED ORDER — ONDANSETRON HCL 4 MG/2ML IJ SOLN
INTRAMUSCULAR | Status: DC | PRN
  Administered 2014-02-03: 4 mg via INTRAVENOUS

## 2014-02-03 MED ORDER — DIAZEPAM 5 MG PO TABS: 5.0000 mg | ORAL_TABLET | Freq: Four times a day (QID) | ORAL | Status: DC | PRN

## 2014-02-03 MED ORDER — FENTANYL CITRATE 0.05 MG/ML IJ SOLN
INTRAMUSCULAR | Status: DC | PRN
  Administered 2014-02-03 (×10): 50 ug via INTRAVENOUS

## 2014-02-03 MED ORDER — BUPROPION HCL 100 MG PO TABS
100.0000 mg | ORAL_TABLET | Freq: Two times a day (BID) | ORAL | Status: DC
  Administered 2014-02-03 – 2014-02-06 (×6): 100 mg via ORAL
  Filled 2014-02-03 (×8): qty 1

## 2014-02-03 MED ORDER — HYDROMORPHONE HCL PF 1 MG/ML IJ SOLN
INTRAMUSCULAR | Status: AC
  Administered 2014-02-03: 0.5 mg via INTRAVENOUS
  Filled 2014-02-03: qty 2

## 2014-02-03 MED ORDER — DIPHENHYDRAMINE HCL 50 MG/ML IJ SOLN
INTRAMUSCULAR | Status: DC | PRN
  Administered 2014-02-03: 25 mg via INTRAVENOUS

## 2014-02-03 MED ORDER — POLYETHYLENE GLYCOL 3350 17 G PO PACK: 17.0000 g | PACK | Freq: Every day | ORAL | Status: DC | PRN

## 2014-02-03 MED ORDER — OXYCODONE HCL 10 MG PO TABS: 10.0000 mg | ORAL_TABLET | Freq: Four times a day (QID) | ORAL | Status: DC | PRN

## 2014-02-03 MED ORDER — METHOCARBAMOL 500 MG PO TABS
500.0000 mg | ORAL_TABLET | Freq: Four times a day (QID) | ORAL | Status: DC | PRN
  Administered 2014-02-03 – 2014-02-05 (×3): 500 mg via ORAL
  Filled 2014-02-03 (×3): qty 1

## 2014-02-03 MED ORDER — GABAPENTIN 300 MG PO CAPS
300.0000 mg | ORAL_CAPSULE | Freq: Three times a day (TID) | ORAL | Status: DC
  Administered 2014-02-03 – 2014-02-06 (×8): 300 mg via ORAL
  Filled 2014-02-03 (×11): qty 1

## 2014-02-03 MED ORDER — DIAZEPAM 5 MG PO TABS
5.0000 mg | ORAL_TABLET | Freq: Four times a day (QID) | ORAL | Status: DC | PRN
  Administered 2014-02-03 – 2014-02-05 (×4): 5 mg via ORAL
  Filled 2014-02-03 (×4): qty 1

## 2014-02-03 MED ORDER — HYDROMORPHONE HCL PF 2 MG/ML IJ SOLN: 4.0000 mg | Freq: Once | INTRAMUSCULAR | Status: AC

## 2014-02-03 MED ORDER — AMPHETAMINE-DEXTROAMPHETAMINE 10 MG PO TABS: 20.0000 mg | ORAL_TABLET | Freq: Every day | ORAL | Status: DC | PRN

## 2014-02-03 MED ORDER — HYDROMORPHONE HCL PF 1 MG/ML IJ SOLN
INTRAMUSCULAR | Status: AC
  Filled 2014-02-03: qty 1

## 2014-02-03 MED ORDER — HYDROMORPHONE HCL PF 1 MG/ML IJ SOLN: 1.0000 mg | INTRAMUSCULAR | Status: DC | PRN

## 2014-02-03 MED ORDER — DIAZEPAM 5 MG/ML IJ SOLN: 10.0000 mg | Freq: Once | INTRAMUSCULAR | Status: AC

## 2014-02-03 MED ORDER — MIDAZOLAM HCL 2 MG/2ML IJ SOLN
1.0000 mg | Freq: Once | INTRAMUSCULAR | Status: AC
  Administered 2014-02-03: 0.5 mg via INTRAVENOUS

## 2014-02-03 MED ORDER — HYDROMORPHONE HCL 2 MG PO TABS: 2.0000 mg | ORAL_TABLET | ORAL | Status: DC | PRN

## 2014-02-03 SURGICAL SUPPLY — 65 items
APL SKNCLS STERI-STRIP NONHPOA (GAUZE/BANDAGES/DRESSINGS) ×1
BANDAGE ELASTIC 6 VELCRO ST LF (GAUZE/BANDAGES/DRESSINGS) ×2 IMPLANT
BANDAGE ESMARK 6X9 LF (GAUZE/BANDAGES/DRESSINGS) IMPLANT
BENZOIN TINCTURE PRP APPL 2/3 (GAUZE/BANDAGES/DRESSINGS) ×2 IMPLANT
BIT DRILL 5/64X5 DISP (BIT) ×2 IMPLANT
BNDG CMPR 9X6 STRL LF SNTH (GAUZE/BANDAGES/DRESSINGS) ×1
BNDG COHESIVE 6X5 TAN STRL LF (GAUZE/BANDAGES/DRESSINGS) ×2 IMPLANT
BNDG ESMARK 6X9 LF (GAUZE/BANDAGES/DRESSINGS) ×3
BOOTCOVER CLEANROOM LRG (PROTECTIVE WEAR) ×6 IMPLANT
CANISTER SUCTION WELLS/JOHNSON (MISCELLANEOUS) ×2 IMPLANT
CLOSURE WOUND 1/2 X4 (GAUZE/BANDAGES/DRESSINGS) ×1
COVER SURGICAL LIGHT HANDLE (MISCELLANEOUS) ×3 IMPLANT
CUFF TOURNIQUET SINGLE 34IN LL (TOURNIQUET CUFF) ×2 IMPLANT
CUFF TOURNIQUET SINGLE 44IN (TOURNIQUET CUFF) IMPLANT
DRAPE C-ARM 42X72 X-RAY (DRAPES) IMPLANT
DRAPE OEC MINIVIEW 54X84 (DRAPES) IMPLANT
DRAPE U-SHAPE 47X51 STRL (DRAPES) ×2 IMPLANT
DRSG PAD ABDOMINAL 8X10 ST (GAUZE/BANDAGES/DRESSINGS) ×5 IMPLANT
DURAPREP 26ML APPLICATOR (WOUND CARE) ×3 IMPLANT
ELECT REM PT RETURN 9FT ADLT (ELECTROSURGICAL) ×3
ELECTRODE REM PT RTRN 9FT ADLT (ELECTROSURGICAL) ×1 IMPLANT
GAUZE XEROFORM 1X8 LF (GAUZE/BANDAGES/DRESSINGS) ×2 IMPLANT
GLOVE BIO SURGEON STRL SZ7.5 (GLOVE) ×2 IMPLANT
GLOVE BIOGEL PI IND STRL 6.5 (GLOVE) IMPLANT
GLOVE BIOGEL PI IND STRL 7.0 (GLOVE) IMPLANT
GLOVE BIOGEL PI IND STRL 8 (GLOVE) IMPLANT
GLOVE BIOGEL PI INDICATOR 6.5 (GLOVE) ×2
GLOVE BIOGEL PI INDICATOR 7.0 (GLOVE) ×4
GLOVE BIOGEL PI INDICATOR 8 (GLOVE) ×4
GLOVE BIOGEL PI ORTHO PRO SZ8 (GLOVE) ×2
GLOVE ECLIPSE 7.0 STRL STRAW (GLOVE) ×2 IMPLANT
GLOVE ORTHO TXT STRL SZ7.5 (GLOVE) ×3 IMPLANT
GLOVE PI ORTHO PRO STRL SZ8 (GLOVE) ×1 IMPLANT
GLOVE SURG ORTHO 8.0 STRL STRW (GLOVE) ×4 IMPLANT
GOWN STRL REUS W/ TWL LRG LVL3 (GOWN DISPOSABLE) IMPLANT
GOWN STRL REUS W/ TWL XL LVL3 (GOWN DISPOSABLE) IMPLANT
GOWN STRL REUS W/TWL LRG LVL3 (GOWN DISPOSABLE) ×12
GOWN STRL REUS W/TWL XL LVL3 (GOWN DISPOSABLE) ×3
KIT BASIN OR (CUSTOM PROCEDURE TRAY) ×3 IMPLANT
KIT ROOM TURNOVER OR (KITS) ×3 IMPLANT
MANIFOLD NEPTUNE II (INSTRUMENTS) ×1 IMPLANT
NS IRRIG 1000ML POUR BTL (IV SOLUTION) ×3 IMPLANT
PACK ORTHO EXTREMITY (CUSTOM PROCEDURE TRAY) ×3 IMPLANT
PAD ARMBOARD 7.5X6 YLW CONV (MISCELLANEOUS) ×6 IMPLANT
PAD CAST 4YDX4 CTTN HI CHSV (CAST SUPPLIES) ×2 IMPLANT
PADDING CAST COTTON 4X4 STRL (CAST SUPPLIES)
PADDING CAST COTTON 6X4 STRL (CAST SUPPLIES) ×4 IMPLANT
SPONGE GAUZE 4X4 12PLY (GAUZE/BANDAGES/DRESSINGS) ×3 IMPLANT
SPONGE LAP 18X18 X RAY DECT (DISPOSABLE) ×4 IMPLANT
SPONGE LAP 4X18 X RAY DECT (DISPOSABLE) ×2 IMPLANT
STAPLER VISISTAT 35W (STAPLE) ×1 IMPLANT
STOCKINETTE IMPERVIOUS LG (DRAPES) ×2 IMPLANT
STRIP CLOSURE SKIN 1/2X4 (GAUZE/BANDAGES/DRESSINGS) ×1 IMPLANT
SUCTION FRAZIER TIP 10 FR DISP (SUCTIONS) ×3 IMPLANT
SUT VIC AB 1 CT1 27 (SUTURE) ×6
SUT VIC AB 1 CT1 27XBRD ANBCTR (SUTURE) IMPLANT
SUT VIC AB 2-0 CT1 27 (SUTURE) ×6
SUT VIC AB 2-0 CT1 TAPERPNT 27 (SUTURE) IMPLANT
SUT VIC AB 3-0 SH 18 (SUTURE) ×2 IMPLANT
SYR CONTROL 10ML LL (SYRINGE) IMPLANT
TOWEL OR 17X24 6PK STRL BLUE (TOWEL DISPOSABLE) ×3 IMPLANT
TOWEL OR 17X26 10 PK STRL BLUE (TOWEL DISPOSABLE) ×3 IMPLANT
TUBE CONNECTING 12'X1/4 (SUCTIONS) ×1
TUBE CONNECTING 12X1/4 (SUCTIONS) ×2 IMPLANT
YANKAUER SUCT BULB TIP NO VENT (SUCTIONS) IMPLANT

## 2014-02-03 NOTE — Anesthesia Preprocedure Evaluation (Addendum)
Anesthesia Evaluation  Patient identified by MRN, date of birth, ID band Patient awake    Reviewed: Allergy & Precautions, H&P , NPO status , Patient's Chart, lab work & pertinent test results, reviewed documented beta blocker date and time   History of Anesthesia Complications (+) DIFFICULT IV STICK / SPECIAL LINE  Airway Mallampati: II TM Distance: >3 FB     Dental  (+) Teeth Intact, Dental Advisory Given, Poor Dentition   Pulmonary Current Smoker,  breath sounds clear to auscultation        Cardiovascular negative cardio ROS  Rhythm:Regular     Neuro/Psych Seizures -, Well Controlled,  Depression Bipolar Disorder    GI/Hepatic negative GI ROS, (+)     substance abuse  IV drug use, Hepatitis -, C  Endo/Other  negative endocrine ROS  Renal/GU      Musculoskeletal negative musculoskeletal ROS (+)   Abdominal (+)  Abdomen: soft. Bowel sounds: normal.  Peds  Hematology negative hematology ROS (+)   Anesthesia Other Findings H/O IV drug use, currently on methadone.  Difficult IV stick noted, #20 g started in left wrist and istat obtained but unable to collect enough blood for all labs ordered.  Pt states she had a tubal ligation and cannot be pregnant because she " has not been sexually active in months" .  iStat obtained for labs required by anesthesia.  Dr Mary Long aware.    Reproductive/Obstetrics negative OB ROS                       Anesthesia Physical Anesthesia Plan  ASA: III  Anesthesia Plan: General   Post-op Pain Management:    Induction: Intravenous  Airway Management Planned: LMA  Additional Equipment:   Intra-op Plan:   Post-operative Plan: Extubation in OR  Informed Consent: I have reviewed the patients History and Physical, chart, labs and discussed the procedure including the risks, benefits and alternatives for the proposed anesthesia with the patient or authorized  representative who has indicated his/her understanding and acceptance.     Plan Discussed with: CRNA and Anesthesiologist  Anesthesia Plan Comments:         Anesthesia Quick Evaluation

## 2014-02-03 NOTE — Progress Notes (Signed)
Unable to find Iv access. Bronson IngJohn Martinelli, CRNA attempted to stat IV without success.
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
## 2014-02-03 NOTE — Progress Notes (Signed)
Called from floor regarding severe uncontrolled postoperative pain.  I called anesthesiology, Dr. Toy CookeyFredricks, asking for help with postop nerve blocks, who responded this was not an appropriate long term plan, and they did not have the resources to support this.   I have called palliative care, and they are going to try to help manage the postop pain control.  We may need to transfer to ICU to be able to administer high dose narcotics under more intense supervision, but will defer this decision to Palliative Care.  Will also start PCA dilaudid, with IV valium, continue with the 80 mg methadone daily, continue oxycodone.   Eulas PostLANDAU,Keirstyn Aydt P, MD

## 2014-02-03 NOTE — Transfer of Care (Signed)
Immediate Anesthesia Transfer of Care Note  Patient: Mary Long  Procedure(s) Performed: Procedure(s): RIGHT AMPUTATION LEG THROUGH TIBIA/FIBULA (Right)  Patient Location: PACU  Anesthesia Type:General  Level of Consciousness: awake, alert  and oriented  Airway & Oxygen Therapy: Patient connected to nasal cannula oxygen  Post-op Assessment: Report given to PACU RN  Post vital signs: stable  Complications: No apparent anesthesia complications

## 2014-02-03 NOTE — Discharge Instructions (Signed)
Diet: As you were doing prior to hospitalization   Shower:  Sponge bath only, NO SOAKING IN TUB.  If the bandage gets wet, change with a clean dry gauze.  Dressing:  Keep splint clean and dry  There are sticky tapes (steri-strips) on your wounds and all the stitches are absorbable.  Leave the steri-strips in place when changing your dressings, they will peel off with time, usually 2-3 weeks.  Activity:  Increase activity slowly as tolerated, but follow the weight bearing instructions below.  No lifting or driving for 6 weeks.  Weight Bearing:   nonweight bearing.    To prevent constipation: you may use a stool softener such as -  Colace (over the counter) 100 mg by mouth twice a day  Drink plenty of fluids (prune juice may be helpful) and high fiber foods Miralax (over the counter) for constipation as needed.    Itching:  If you experience itching with your medications, try taking only a single pain pill, or even half a pain pill at a time.  You may take up to 10 pain pills per day, and you can also use benadryl over the counter for itching or also to help with sleep.   Precautions:  If you experience chest pain or shortness of breath - call 911 immediately for transfer to the hospital emergency department!!  If you develop a fever greater that 101 F, purulent drainage from wound, increased redness or drainage from wound, or calf pain -- Call the office at [phone]                                                Follow- Up Appointment:  Please call for an appointment to be seen in 2 weeks [place] - [phone]

## 2014-02-03 NOTE — H&P (Signed)
PREOPERATIVE H&P  Chief Complaint: RIGHT TIBIA INFECTED NONUNION  HPI: Mary Long is a 38 y.o. female who presents for preoperative history and physical with a diagnosis of RIGHT TIBIA INFECTED NONUNION. Symptoms are rated as moderate to severe, and have been worsening.  This is significantly impairing activities of daily living.  She has elected for surgical management. She's had [facility], and has had persistent drainage, with MRSA, unable to bear weight on the leg, for years.  Past Medical History  Diagnosis Date  . Hep C w/o coma, chronic   . Bipolar 1 disorder   . Depression   . Illicit drug use   . Seizures   . Blindness of left eye   . Complication of anesthesia     causes manic attacks and anxiety post anesthesia   Past Surgical History  Procedure Laterality Date  . Fracture surgery    . Tubal ligation  01/01/2001  . I&d of 4 abscesses to right arm and wrist  06/18/2003  . Orif femur fracture Right 12/18/2012    Procedure: OPEN REDUCTION INTERNAL FIXATION (ORIF) DISTAL FEMUR FRACTURE;  Surgeon: Eulas PostJoshua P Leldon Steege, MD;  Location: [facility] OR;  Service: Orthopedics;  Laterality: Right;   History   Social History  . Marital Status: Widowed    Spouse Name: N/A    Number of Children: N/A  . Years of Education: N/A   Social History Main Topics  . Smoking status: Current Every Day Smoker -- 1.00 packs/day    Types: Cigarettes  . Smokeless tobacco: Never Used     Comment: smokes electronic cigarettes  . Alcohol Use: No  . Drug Use: Yes    Special: Cocaine, Heroin     Comment: History of heroin and cocaine use; none for "years"  . Sexual Activity: Yes   Other Topics Concern  . Not on file   Social History Narrative  . No narrative on file   No family history on file. Allergies  Allergen Reactions  . Tylenol [Acetaminophen] Nausea And Vomiting   Prior to Admission medications   Medication Sig Start Date End Date Taking? Authorizing  Provider  ALPRAZolam Prudy Feeler(XANAX) 1 MG tablet Take 1 mg by mouth 3 (three) times daily.    Yes Historical Provider, MD  amphetamine-dextroamphetamine (ADDERALL) 20 MG tablet Take 20 mg by mouth daily as needed (for when in school).    Yes Historical Provider, MD  buPROPion (WELLBUTRIN) 100 MG tablet Take 100 mg by mouth 2 (two) times daily.    Yes Historical Provider, MD  diazepam (VALIUM) 5 MG tablet Take 1 tablet (5 mg total) by mouth every 6 (six) hours as needed for anxiety. 12/18/12  Yes Eulas PostJoshua P Petronella Shuford, MD  FLUoxetine (PROZAC) 20 MG tablet Take 20 mg by mouth at bedtime. Take with olanzapine   Yes Historical Provider, MD  METHADONE HCL PO Take 85 mg by mouth daily.   Yes Historical Provider, MD  OLANZapine (ZYPREXA) 5 MG tablet Take 5 mg by mouth at bedtime. Take with fluoxetine   Yes Historical Provider, MD  PRESCRIPTION MEDICATION Apply 1 application topically 3 (three) times daily as needed (for pain). A COMPOUNDED PAIN CREAM (with three or four components in it) that gets shipped to the pt   Yes Historical Provider, MD  zolpidem (AMBIEN) 10 MG tablet Take 10 mg by mouth at bedtime as needed for sleep. For sleep   Yes Historical Provider, MD     Positive ROS: All other  systems have been reviewed and were otherwise negative with the exception of those mentioned in the HPI and as above.  Physical Exam: General: Alert, no acute distress Cardiovascular: No pedal edema Respiratory: No cyanosis, no use of accessory musculature GI: No organomegaly, abdomen is soft and non-tender Skin: She has a draining wounds over the distal tibia that are partially open. Neurologic: Sensation intact distally Psychiatric: Patient is competent for consent with normal mood and affect, I brought her back in the office yesterday to confirm her insight in her disease process and her decision making process. Lymphatic: No axillary or cervical lymphadenopathy  MUSCULOSKELETAL: Right leg has gross motion at the tibia  with chronic nonunion and severe pain with deformity.  Assessment: RIGHT TIBIA INFECTED NONUNION  Plan: Plan for Procedure(s): RIGHT BELOW-KNEE AMPUTATION  The risks benefits and alternatives were discussed with the patient including but not limited to the risks of nonoperative treatment, versus surgical intervention including infection, bleeding, nerve injury,  blood clots, cardiopulmonary complications, morbidity, mortality, among others, and they were willing to proceed.   Eulas Post, MD Cell [phone]   02/03/2014 7:23 AM

## 2014-02-03 NOTE — Progress Notes (Signed)
Informed Dr Randa EvensEdwards of no IV access and pt being a difficult stick. Will assess.

## 2014-02-03 NOTE — Progress Notes (Signed)
Utilization review completed. Katriel Cutsforth, RN, BSN. 

## 2014-02-03 NOTE — Progress Notes (Signed)
Dr Dion SaucierLandau here and informed of unable to obtain cbc and preg test. No new orders at this time.

## 2014-02-03 NOTE — Anesthesia Postprocedure Evaluation (Signed)
  Anesthesia Post-op Note  Patient: Mary Long  Procedure(s) Performed: Procedure(s): RIGHT AMPUTATION LEG THROUGH TIBIA/FIBULA (Right)  Patient Location: PACU  Anesthesia Type:General  Level of Consciousness: awake  Airway and Oxygen Therapy: Patient Spontanous Breathing  Post-op Pain: mild  Post-op Assessment: Post-op Vital signs reviewed  Post-op Vital Signs: Reviewed  Last Vitals:  Filed Vitals:   02/03/14 1315  BP: 158/106  Pulse: 95  Temp:   Resp: 11    Complications: No apparent anesthesia complications

## 2014-02-03 NOTE — Progress Notes (Signed)
Call placed to Dr Dion SaucierLandau office to have him call me related to unable to obtain hcg- preg, and cbc.  We were able to do an Istat.

## 2014-02-03 NOTE — Anesthesia Procedure Notes (Signed)
Procedure Name: LMA Insertion Date/Time: 02/03/2014 10:30 AM Performed by: Ellin GoodieWEAVER, Keithen Capo M Pre-anesthesia Checklist: Patient identified, Emergency Drugs available, Suction available, Patient being monitored and Timeout performed Patient Re-evaluated:Patient Re-evaluated prior to inductionOxygen Delivery Method: Circle system utilized Preoxygenation: Pre-oxygenation with 100% oxygen Intubation Type: IV induction and Inhalational induction Ventilation: Mask ventilation without difficulty LMA: LMA inserted LMA Size: 4.0 Number of attempts: 1 Placement Confirmation: positive ETCO2 and breath sounds checked- equal and bilateral Tube secured with: Tape Dental Injury: Teeth and Oropharynx as per pre-operative assessment  Comments: Easy atraumatic induction and LMA insertion.

## 2014-02-03 NOTE — Op Note (Signed)
02/03/2014  11:53 AM  PATIENT:  Mary Long    PRE-OPERATIVE DIAGNOSIS:  RIGHT INFECTED NONUNION  TIBIA AND FIBULA WITH CHRONIC OSTEOMYELITIS  POST-OPERATIVE DIAGNOSIS:  Same  PROCEDURE:  RIGHT BELOW-KNEE AMPUTATION  SURGEON:  Eulas PostLANDAU,Rye Decoste P, MD  PHYSICIAN ASSISTANT: Janace LittenBrandon Parry, OPA-C, present and scrubbed throughout the case, critical for completion in a timely fashion, and for retraction, instrumentation, and closure.  Second Asst. HCT Martensen, PA student  ANESTHESIA:   General  PREOPERATIVE INDICATIONS:  Mary BrowLouise B Westra is a  38 y.o. female who has had chronic right distal tibia and fibula osteomyelitis with drainage, MRSA, failed multiple previous surgical attempted salvage procedures [facility], elected for below-knee amputation.  The risks benefits and alternatives were discussed with the patient preoperatively including but not limited to the risks of infection, bleeding, nerve injury, cardiopulmonary complications, the need for revision surgery, among others, and the patient was willing to proceed.  OPERATIVE IMPLANTS: None  OPERATIVE FINDINGS: The bone quality was reasonably good and appeared normal at the level of the bony resection.  OPERATIVE PROCEDURE: The patient is brought to the operating room and placed in the supine position. General anesthesia was administered. IV antibiotics were given. The right lower extremity was prepped and draped in usual sterile fashion. Time out was performed. The leg was elevated exsanguinated and the tourniquet was inflated. Total tourniquet time was 26 minutes at a level of 300 mm mercury.  The flaps were drawn out, and placing a bony resection approximately 11 cm distal to the joint line at the tibia, and about 9 cm distal to the joint line at the fibula. I made a extended posterior flap, involving the gastrocnemius, and her skin. I made an incision through the skin, and then through the posterior, anterior, and lateral  compartments, and released and the musculature dissecting the flaps as indicated above.  I then identified level of the resection, exposed the periosteum, and reflected this, and then performed a osteotomy of the fibula and the tibia using an oscillating saw. I beveled the anterior aspect with the saw as well as a rasp.  I drilled 2 holes in the anterior tibia to attach the muscular flap.  I identified the posterior tibial artery, and he  ligated this using silk ties.  I exposed the tibial nerve, and resected this proximally, wearing in his thumb muscular tissue, I used the Bovie to perform this maneuver.  I then released the tourniquet after identifying the saphenous vein, and ligated the saphenous vein, and then with the tourniquet released I was able to identify the parallel artery which I then tied off using silk tie.  All bleeding was appropriately contained, and good hemostasis achieved and I irrigated the wounds copiously and then repaired the fascial flap of the gastroc through the drill holes of the tibia, 0 Vicryl, followed by 0 Vicryl 2 appropriately contour the muscular flap over the end of the tibia, and then used 2-0 Vicryl for subcutaneous tissue, and then nylon for the skin followed by sterile gauze and a splint.  The patient tolerated the procedure well no complications.

## 2014-02-03 NOTE — Plan of Care (Signed)
Problem: Consults Goal: Lower Extremity Amputation Patient Education See Patient Education Module for education specifics.  L below Knee Amputation

## 2014-02-04 DIAGNOSIS — F319 Bipolar disorder, unspecified: Secondary | ICD-10-CM

## 2014-02-04 DIAGNOSIS — G894 Chronic pain syndrome: Secondary | ICD-10-CM

## 2014-02-04 LAB — COMPREHENSIVE METABOLIC PANEL
ALBUMIN: 3 g/dL — AB (ref 3.5–5.2)
ALT: 22 U/L (ref 0–35)
ANION GAP: 11 (ref 5–15)
AST: 55 U/L — ABNORMAL HIGH (ref 0–37)
Alkaline Phosphatase: 95 U/L (ref 39–117)
BUN: 7 mg/dL (ref 6–23)
CALCIUM: 8.2 mg/dL — AB (ref 8.4–10.5)
CO2: 26 mEq/L (ref 19–32)
CREATININE: 0.62 mg/dL (ref 0.50–1.10)
Chloride: 104 mEq/L (ref 96–112)
GFR calc non Af Amer: >90 mL/min (ref >90)
GLUCOSE: 92 mg/dL (ref 70–99)
Potassium: 4 mEq/L (ref 3.7–5.3)
Sodium: 141 mEq/L (ref 137–147)
TOTAL PROTEIN: 7.3 g/dL (ref 6.0–8.3)
Total Bilirubin: <0.2 mg/dL — ABNORMAL LOW (ref 0.3–1.2)

## 2014-02-04 LAB — RENAL FUNCTION PANEL
Albumin: 3 g/dL — ABNORMAL LOW (ref 3.5–5.2)
Anion gap: 12 (ref 5–15)
BUN: 5 mg/dL — AB (ref 6–23)
CALCIUM: 8.1 mg/dL — AB (ref 8.4–10.5)
CO2: 24 meq/L (ref 19–32)
CREATININE: 0.55 mg/dL (ref 0.50–1.10)
Chloride: 101 mEq/L (ref 96–112)
GFR calc Af Amer: >90 mL/min (ref >90)
Glucose, Bld: 136 mg/dL — ABNORMAL HIGH (ref 70–99)
Phosphorus: 1.8 mg/dL — ABNORMAL LOW (ref 2.3–4.6)
Potassium: 3.9 mEq/L (ref 3.7–5.3)
Sodium: 137 mEq/L (ref 137–147)

## 2014-02-04 LAB — GAMMA GT: GGT: 86 U/L — ABNORMAL HIGH (ref 7–51)

## 2014-02-04 NOTE — Evaluation (Addendum)
Physical Therapy Evaluation Patient Details Name: Catha BrowLouise B Lashomb MRN: [id number] DOB: 12/29/1975 Today's Date: 02/04/2014   History of Present Illness  38 y.o. female s/p right transtibial amputation.  Clinical Impression  Patient is seen following the above procedure and presents with functional limitations due to the deficits listed below (see PT Problem List). Pt willing to participate in transfer training but reports too much pain to practice pre-gait/standing activities. Overall, transfers fairly well with min assist during squat pivot transfer to various surfaces. (Addendum - patient was very adamant to return home without further rehabilitation in CIR or SNF.) Patient will benefit from skilled PT to increase their independence and safety with mobility to allow discharge to the venue listed below.    Follow Up Recommendations Home health PT;Supervision for mobility/OOB    Equipment Recommendations  Rolling walker with 5" wheels;3in1 (PT)    Recommendations for Other Services OT consult     Precautions / Restrictions Precautions Precautions: Fall Precaution Comments: Reviewed positioning for opitmal healing Restrictions Weight Bearing Restrictions: Yes RLE Weight Bearing: Non weight bearing      Mobility  Bed Mobility Overal bed mobility: Modified Independent             General bed mobility comments: Requires extra time, no physical assist needed.  Transfers Overall transfer level: Needs assistance Equipment used: None;1 person hand held assist Transfers: Squat Pivot Transfers     Squat pivot transfers: Min assist;+2 safety/equipment     General transfer comment: min assist for balance with second person available for safety. [facility] and [facility] to recliner. VC for technique and to take her time vs rushing.  Ambulation/Gait                Stairs            Wheelchair Mobility    Modified Rankin (Stroke Patients Only)        Balance Overall balance assessment: Needs assistance Sitting-balance support: No upper extremity supported;Feet supported Sitting balance-Leahy Scale: Fair     Standing balance support: Single extremity supported Standing balance-Leahy Scale: Poor                               Pertinent Vitals/Pain Pt requests pain medication prior to therapy. Nurse informed and brought pain medication. Patient repositioned in chair for comfort.     Home Living Family/patient expects to be discharged to:: Private residence Living Arrangements: Spouse/significant other Available Help at Discharge: Friend(s);Available 24 hours/day Type of Home: Apartment Home Access: Stairs to enter Entrance Stairs-Rails: Right;Left Entrance Stairs-Number of Steps: 2 Home Layout: One level Home Equipment: Other (comment);Cane - single point [employer](iwalk)      Prior Function Level of Independence: Independent with assistive device(s)         Comments: used iWalk and single point cane for ambulation     Hand Dominance   Dominant Hand: Right    Extremity/Trunk Assessment   Upper Extremity Assessment: Defer to OT evaluation           Lower Extremity Assessment: RLE deficits/detail RLE Deficits / Details: decreased strength and ROM as expected post-op       Communication   Communication: No difficulties  Cognition Arousal/Alertness: Awake/alert Behavior During Therapy: WFL for tasks assessed/performed Overall Cognitive Status: Within Functional Limits for tasks assessed  General Comments General comments (skin integrity, edema, etc.): Spent significant amount of time for patient education regarding importance of out of bed mobility, working with therapy, and positioning to allow for maximum outcome of recovery and possible use of a prosthetic in the future. Declines pre-gait training with rolling walker, states she is in too much pain to attempt  standing.    Exercises Amputee Exercises Quad Sets: AROM;Right;10 reps;Seated      Assessment/Plan    PT Assessment Patient needs continued PT services  PT Diagnosis Difficulty walking;Abnormality of gait;Acute pain   PT Problem List Decreased strength;Decreased range of motion;Decreased activity tolerance;Decreased balance;Decreased mobility;Decreased knowledge of use of DME;Decreased safety awareness;Decreased knowledge of precautions;Pain  PT Treatment Interventions DME instruction;Gait training;Stair training;Functional mobility training;Therapeutic activities;Therapeutic exercise;Balance training;Neuromuscular re-education;Patient/family education;Wheelchair mobility training;Modalities   PT Goals (Current goals can be found in the Care Plan section) Acute Rehab PT Goals Patient Stated Goal: No pain PT Goal Formulation: With patient Time For Goal Achievement: 02/11/14 Potential to Achieve Goals: Good    Frequency Min 5X/week   Barriers to discharge        Co-evaluation               End of Session   Activity Tolerance: Patient limited by pain Patient left: in chair;with call bell/phone within reach Nurse Communication: Mobility status         Time: 1610-9604 PT Time Calculation (min): 32 min   Charges:   PT Evaluation $Initial PT Evaluation Tier I: 1 Procedure PT Treatments $Therapeutic Activity: 8-22 mins   PT G Codes:         Charlsie Merles, [place] [phone]  [place] 02/04/2014, 11:23 AM  Addendum to clinical impression

## 2014-02-04 NOTE — Care Management Note (Signed)
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
## 2014-02-04 NOTE — Progress Notes (Signed)
Orthopaedic Trauma Service Progress Note  Subjective  OT in room with pt Pt somnolent/lethargic in chair, on PCA arousable to loud voice  States pain is somewhat controlled when I am able to get her to open eyes  Tolerating diet +void No BM, unable to recall last BM  Very anxious at times Did discuss possibility of CIR consult, pt became very tearful and adamant that she was to return home   Objective   BP 138/88  Pulse 78  Temp(Src) 98.3 F (36.8 C) (Oral)  Resp 15  Wt 58.968 kg (130 lb)  SpO2 97%  LMP 02/03/2014  Intake/Output     07/24 0701 - 07/25 0700 07/25 0701 - 07/26 0700   I.V. (mL/kg) 2083.8 (35.3)    Total Intake(mL/kg) 2083.8 (35.3)    Blood 100    Total Output 100     Net +1983.8          Urine Occurrence 2 x       Labs  Results for Catha BrowVINCENT, Mary B (MRN [id number]) as of 02/04/2014 10:00  Ref. Range 02/04/2014 04:41  Sodium Latest Range: 137-147 mEq/L 141  Potassium Latest Range: 3.7-5.3 mEq/L 4.0  Chloride Latest Range: 96-112 mEq/L 104  CO2 Latest Range: 19-32 mEq/L 26  BUN Latest Range: 6-23 mg/dL 7  Creatinine Latest Range: 0.50-1.10 mg/dL 8.460.62  Calcium Latest Range: 8.4-10.5 mg/dL 8.2 (L)  GFR calc non Af Amer Latest Range: >90 mL/min >90  GFR calc Af Amer Latest Range: >90 mL/min >90  Glucose Latest Range: 70-99 mg/dL 92  Anion gap Latest Range: 5-15  11  Alkaline Phosphatase Latest Range: 39-117 U/L 95  Albumin Latest Range: 3.5-5.2 g/dL 3.0 (L)  AST Latest Range: 0-37 U/L 55 (H)  ALT Latest Range: 0-35 U/L 22  Total Protein Latest Range: 6.0-8.3 g/dL 7.3  Total Bilirubin Latest Range: 0.3-1.2 mg/dL <9.6<0.2 (L)   Exam  Gen: in bedside chair, somnolent, can arouse to loud voice  Lungs: breathing unlabored, on supp O2 and monitors Cardiac: Reg  Ext:       Right Lower Extremity   Splint and dressing c/d/i  No apparent issues    Assessment and Plan   POD/HD#: 1  38 y/o female with chronic R tibia osteo s/p R BKA  1. R BKA for  R tibia chronic osteo  Continue with current dressing  PT/OT consults  Ice and elevate   2. Acute on chronic pain   Long term pain management will be a significant barrier  When discussing discontinuation of PCA pts stated " great so I will just sit here and suffer in Pain "  Discussed CIR eval and how I thought this would be an excellent avenue not only from a therapy standpoint to help her adapt with her new BKA but also from a pain management standpoint.  Pt not receptive to this at all    Do feel that PCA needs to be dc'd tomorrow    [facility] [address]    Really needs a pain management specialist   Pt to me appears to have very poor coping mechanisms   May add relafen or similar to pain regimen after PCA dc's.  Pt has been started on neurontin. Will check renal function panel  3. ABL anemia/Hemodynamics  Check cbc in am   4. Medical issues   Home meds  5. DVT/PE prophylaxis:  Will start Lovenox  6. ID:   Completed periop abx  7. FEN/Foley/Lines:  Diet as tolerated   8. Dispo:  Pain management likely to be limiting factor related to dc   Continue to wean IV pain meds as Palliative care is doing  Still feel CIR would be of benefit     Mearl Latin, PA-C [facility] [phone] (P) 02/04/2014 12:10 PM  **Disclaimer: This note may have been dictated with voice recognition software. Similar sounding words can inadvertently be transcribed and this note may contain transcription errors which may not have been corrected upon publication of note.**

## 2014-02-04 NOTE — Evaluation (Signed)
Occupational Therapy Evaluation Patient Details Name: Mary Long MRN: [id number] DOB: [birth year] Today's Date: 02/04/2014    History of Present Illness 38 y.o. female s/p right transtibial amputation.  H/O bipolar, heroine abuse, chronic pain, on methadone.   Clinical Impression   Prior to admission, pt was independent in self care and ambulated with a cane.  Pt presents with impulsivity and anxiety.  Adamant she wants to return home as soon as possible and became upset and difficult to console.  Pt can perform seated ADL, but requires assist for LB ADL and ADL transfers.  Goals written for w/c level ADL and transfers as pt will not likely be safe to perform in standing before release from the hospital.     Follow Up Recommendations  Home health OT;Supervision/Assistance - 24 hour    Equipment Recommendations  3 in 1 bedside comode;Tub/shower bench    Recommendations for Other Services       Precautions / Restrictions Precautions Precautions: Fall Precaution Comments: Reviewed positioning for opitmal healing Restrictions Weight Bearing Restrictions: No RLE Weight Bearing: Non weight bearing      Mobility Bed Mobility Overal bed mobility:  (not tested, pt up in chair)             General bed mobility comments: Requires extra time, no physical assist needed.  Transfers Overall transfer level: Needs assistance Equipment used: None;1 person hand held assist Transfers: Squat Pivot Transfers     Squat pivot transfers: Min assist;+2 safety/equipment     General transfer comment: pt with decreased safety awareness and impulsivity    Balance Overall balance assessment: Needs assistance Sitting-balance support: No upper extremity supported;Feet supported Sitting balance-Leahy Scale: Fair     Standing balance support: Single extremity supported Standing balance-Leahy Scale: Poor                              ADL Overall ADL's : Needs  assistance/impaired Eating/Feeding: Independent;Sitting   Grooming: Wash/dry hands;Wash/dry face;Set up;Sitting   Upper Body Bathing: Set up;Sitting   Lower Body Bathing: Moderate assistance;Sit to/from stand   Upper Body Dressing : Set up;Sitting   Lower Body Dressing: Moderate assistance;Sit to/from stand   Toilet Transfer: +2 for physical assistance;Minimal assistance;Stand-pivot;BSC Toilet Transfer Details (indicate cue type and reason): pt with impulsivity Toileting- Clothing Manipulation and Hygiene: Sit to/from stand;Moderate assistance       Functional mobility during ADLs: +2 for physical assistance;Minimal assistance       Vision                     Perception     Praxis      Pertinent Vitals/Pain R LE, on PCA and premedicated, did not rate     Hand Dominance Right   Extremity/Trunk Assessment Upper Extremity Assessment Upper Extremity Assessment: Overall WFL for tasks assessed   Lower Extremity Assessment Lower Extremity Assessment: Defer to PT evaluation RLE Deficits / Details: decreased strength and ROM as expected post-op RLE: Unable to fully assess due to pain       Communication Communication Communication: No difficulties   Cognition Arousal/Alertness: Awake/alert Behavior During Therapy: Anxious;Agitated Overall Cognitive Status: Difficult to assess (due to agitation, tearfulness)                     General Comments       Exercises      Shoulder Instructions      Home  Living Family/patient expects to be discharged to:: Private residence Living Arrangements: Spouse/significant other Available Help at Discharge: Friend(s);Available 24 hours/day Type of Home: Apartment Home Access: Stairs to enter Entrance Stairs-Number of Steps: 2 Entrance Stairs-Rails: Right;Left Home Layout: One level     Bathroom Shower/Tub: Chief Strategy OfficerTub/shower unit   Bathroom Toilet: Standard     Home Equipment: Cane - single point;Wheelchair -  manual          Prior Functioning/Environment Level of Independence: Independent with assistive device(s)        Comments: used iWalk and single point cane for ambulation    OT Diagnosis: Generalized weakness;Acute pain   OT Problem List: Decreased strength;Decreased activity tolerance;Impaired balance (sitting and/or standing);Decreased cognition;Decreased safety awareness;Decreased knowledge of use of DME or AE;Pain   OT Treatment/Interventions: Self-care/ADL training;DME and/or AE instruction;Therapeutic activities;Patient/family education;Balance training;Cognitive remediation/compensation    OT Goals(Current goals can be found in the care plan section) Acute Rehab OT Goals Patient Stated Goal: go home OT Goal Formulation: With patient Time For Goal Achievement: 02/18/14 Potential to Achieve Goals: Fair ADL Goals Pt Will Perform Lower Body Bathing: with supervision;sitting/lateral leans Pt Will Perform Lower Body Dressing: with supervision;sitting/lateral leans Pt Will Transfer to Toilet: with supervision;squat pivot transfer;stand pivot transfer;bedside commode Pt Will Perform Toileting - Clothing Manipulation and hygiene: with supervision;sitting/lateral leans Additional ADL Goal #1: Pt will gather items necessary for ADL from a w/c level independently.  OT Frequency: Min 2X/week   Barriers to D/C:            Co-evaluation              End of Session    Activity Tolerance: Treatment limited secondary to agitation Patient left: in chair;with call bell/phone within reach;with family/visitor present   Time: 1610-96041138-1212 OT Time Calculation (min): 34 min Charges:  OT General Charges $OT Visit: 1 Procedure OT Evaluation $Initial OT Evaluation Tier I: 1 Procedure OT Treatments $Self Care/Home Management : 8-22 mins G-Codes:    Evern BioMayberry, Marshawn Ninneman Lynn 02/04/2014, 12:18 PM [phone]

## 2014-02-04 NOTE — Progress Notes (Signed)
Pt moved to room from 5N24 to 5N15 where this is video surveillance to better safely monitor the patient.

## 2014-02-04 NOTE — Progress Notes (Signed)
Upon hourly rounding, pt found to have sweater soaked. Upon further assessment pts EJ was found laying on the bed with IV fluids soaking the pt and sheets. She states that "it must've fallen out during my sleep". Other IV site on her L wrist was then assessed and flushed for patency. IV fluids then restarted on secondary IV site for pain management and other IV medications. Pt is now resting comfortably. Nursing will continue to monitor.

## 2014-02-05 LAB — COMPREHENSIVE METABOLIC PANEL
ALBUMIN: 2.9 g/dL — AB (ref 3.5–5.2)
ALT: 17 U/L (ref 0–35)
AST: 44 U/L — AB (ref 0–37)
Alkaline Phosphatase: 85 U/L (ref 39–117)
Anion gap: 15 (ref 5–15)
BUN: 6 mg/dL (ref 6–23)
CALCIUM: 8 mg/dL — AB (ref 8.4–10.5)
CO2: 21 mEq/L (ref 19–32)
CREATININE: 0.52 mg/dL (ref 0.50–1.10)
Chloride: 100 mEq/L (ref 96–112)
GFR calc Af Amer: >90 mL/min (ref >90)
GFR calc non Af Amer: >90 mL/min (ref >90)
Glucose, Bld: 89 mg/dL (ref 70–99)
Potassium: 4.4 mEq/L (ref 3.7–5.3)
Sodium: 136 mEq/L — ABNORMAL LOW (ref 137–147)
TOTAL PROTEIN: 6.9 g/dL (ref 6.0–8.3)
Total Bilirubin: 0.5 mg/dL (ref 0.3–1.2)

## 2014-02-05 LAB — CBC
HEMATOCRIT: 34.2 % — AB (ref 36.0–46.0)
HEMOGLOBIN: 10.5 g/dL — AB (ref 12.0–15.0)
MCH: 25.2 pg — AB (ref 26.0–34.0)
MCHC: 30.7 g/dL (ref 30.0–36.0)
MCV: 82.2 fL (ref 78.0–100.0)
Platelets: 297 10*3/uL (ref 150–400)
RBC: 4.16 MIL/uL (ref 3.87–5.11)
RDW: 16.7 % — ABNORMAL HIGH (ref 11.5–15.5)
WBC: 6.9 10*3/uL (ref 4.0–10.5)

## 2014-02-05 MED ORDER — HYDROMORPHONE HCL PF 1 MG/ML IJ SOLN
0.5000 mg | INTRAMUSCULAR | Status: DC | PRN
  Administered 2014-02-05 – 2014-02-06 (×3): 0.5 mg via INTRAVENOUS
  Filled 2014-02-05 (×3): qty 1

## 2014-02-05 MED ORDER — METHOCARBAMOL 500 MG PO TABS
500.0000 mg | ORAL_TABLET | Freq: Four times a day (QID) | ORAL | Status: DC | PRN
  Administered 2014-02-05 – 2014-02-06 (×2): 1000 mg via ORAL
  Filled 2014-02-05 (×2): qty 2

## 2014-02-05 MED ORDER — OXYCODONE HCL 5 MG PO TABS
10.0000 mg | ORAL_TABLET | ORAL | Status: DC | PRN
  Administered 2014-02-05: 10 mg via ORAL
  Administered 2014-02-05 (×2): 15 mg via ORAL
  Administered 2014-02-06 (×2): 10 mg via ORAL
  Filled 2014-02-05 (×3): qty 3
  Filled 2014-02-05 (×2): qty 2

## 2014-02-05 MED ORDER — DEXTROSE 5 % IV SOLN
500.0000 mg | Freq: Four times a day (QID) | INTRAVENOUS | Status: DC | PRN
  Filled 2014-02-05: qty 5

## 2014-02-05 MED ORDER — ENOXAPARIN SODIUM 40 MG/0.4ML ~~LOC~~ SOLN
40.0000 mg | SUBCUTANEOUS | Status: DC
Start: 2014-02-05 — End: 2014-02-06
  Administered 2014-02-05: 40 mg via SUBCUTANEOUS
  Filled 2014-02-05 (×2): qty 0.4

## 2014-02-05 NOTE — Progress Notes (Signed)
Clinical Social Worker (CSW) met with patient to discuss SNF options. Patient declined SNF and reported that she would like to go home. Patient reported that she could arrange 24 assistance if she needed it. Patient reported that she live with her boyfriend Timmothy Sours and he does not work. CSW will continue to follow and assist as needed.   Blima Rich, [place] Weekend CSW [phone]

## 2014-02-05 NOTE — Progress Notes (Addendum)
Physical Therapy Note   Patient Details  Name: Mary Long  MRN: [id number]  DOB: 1975/09/30  Today's Date: 02/05/2014    Noted per chart that patient is now agreeable to CIR after she spoke with Montez MoritaKeith Paul, PA.  Feel this would the most appropriate disposition for further care now that patient is willing. Anticipate she will progress well in CIR, allowing for maximum return of functional independence.  [address] Falls[place], [place] CarolinaPT [phone]

## 2014-02-05 NOTE — Progress Notes (Signed)
Orthopaedic Trauma Service Progress Note Cross Coverage   Subjective  Pt doing great today, very pleasant  Apologetic for yesterday  Agreeable for CIR consult States that she wants to do whatever she can to get better Notes that her chronic pain was all related to her R leg injury and would like to eventually get off of pain meds all together   Sitting in WC, on computer   Notes some itching on her R foot   Objective   BP 113/60  Pulse 105  Temp(Src) 98 F (36.7 C) (Oral)  Resp 18  Wt 58.968 kg (130 lb)  SpO2 98%  LMP 02/03/2014  Intake/Output     07/25 0701 - 07/26 0700 07/26 0701 - 07/27 0700   P.O. 1440 480   I.V. (mL/kg) 900 (15.3) 250 (4.2)   Total Intake(mL/kg) 2340 (39.7) 730 (12.4)   Urine (mL/kg/hr) 400 (0.3)    Blood     Total Output 400     Net +1940 +730        Urine Occurrence 8 x 3 x   Stool Occurrence 1 x 1 x     Labs  Results for MONIC, ENGELMANN (MRN [id number]) as of 02/05/2014 16:27  Ref. Range 02/05/2014 08:45  WBC Latest Range: 4.0-10.5 K/uL 6.9  RBC Latest Range: 3.87-5.11 MIL/uL 4.16  Hemoglobin Latest Range: 12.0-15.0 g/dL 29.5 (L)  HCT Latest Range: 36.0-46.0 % 34.2 (L)  MCV Latest Range: 78.0-100.0 fL 82.2  MCH Latest Range: 26.0-34.0 pg 25.2 (L)  MCHC Latest Range: 30.0-36.0 g/dL 62.1  RDW Latest Range: 11.5-15.5 % 16.7 (H)  Platelets Latest Range: 150-400 K/uL 297    Exam  Gen: awake and alert, NAD, appears much better than yesterday  Lungs: clear, unlabored  Cardiac: RRR Abd: NTND, + BS Ext:      Right Lower Extremity   Dressing c/d/i  Dressing fitting well proximally      Assessment and Plan   POD/HD#: 2   38 y/o female with chronic R tibia osteo s/p R BKA  1. R BKA for R tibia chronic osteo             Continue with current dressing             PT/OT             Ice and elevate   CIR consult   2. Acute on chronic pain             pain much improved  Appreciate Palliative care recs  Will not add NSAID at  this time   Can consider this as we wean her down off narcotics               3. ABL anemia/Hemodynamics             stable  4. Medical issues               Home meds  5. DVT/PE prophylaxis:            lovenox     6. ID:               Completed periop abx                 7. FEN/Foley/Lines:             Diet as tolerated   KVO IVF   8. Dispo:            CIR consult  Overall much improved and stable      Mearl LatinKeith W. Reem Fleury, PA-C [facility] [phone] (P) 02/05/2014 4:25 PM  **Disclaimer: This note may have been dictated with voice recognition software. Similar sounding words can inadvertently be transcribed and this note may contain transcription errors which may not have been corrected upon publication of note.**
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
## 2014-02-06 LAB — COMPREHENSIVE METABOLIC PANEL
ALT: 21 U/L (ref 0–35)
AST: 45 U/L — ABNORMAL HIGH (ref 0–37)
Albumin: 2.7 g/dL — ABNORMAL LOW (ref 3.5–5.2)
Alkaline Phosphatase: 82 U/L (ref 39–117)
Anion gap: 12 (ref 5–15)
BILIRUBIN TOTAL: 0.3 mg/dL (ref 0.3–1.2)
BUN: 7 mg/dL (ref 6–23)
CALCIUM: 8.4 mg/dL (ref 8.4–10.5)
CHLORIDE: 99 meq/L (ref 96–112)
CO2: 26 meq/L (ref 19–32)
CREATININE: 0.5 mg/dL (ref 0.50–1.10)
GFR calc non Af Amer: >90 mL/min (ref >90)
GLUCOSE: 147 mg/dL — AB (ref 70–99)
Potassium: 3.4 mEq/L — ABNORMAL LOW (ref 3.7–5.3)
Sodium: 137 mEq/L (ref 137–147)
Total Protein: 7.1 g/dL (ref 6.0–8.3)

## 2014-02-06 LAB — CBC
HCT: 35 % — ABNORMAL LOW (ref 36.0–46.0)
Hemoglobin: 10.8 g/dL — ABNORMAL LOW (ref 12.0–15.0)
MCH: 25.7 pg — ABNORMAL LOW (ref 26.0–34.0)
MCHC: 30.9 g/dL (ref 30.0–36.0)
MCV: 83.3 fL (ref 78.0–100.0)
PLATELETS: 268 10*3/uL (ref 150–400)
RBC: 4.2 MIL/uL (ref 3.87–5.11)
RDW: 16.5 % — AB (ref 11.5–15.5)
WBC: 5.5 10*3/uL (ref 4.0–10.5)

## 2014-02-06 LAB — VITAMIN D 25 HYDROXY (VIT D DEFICIENCY, FRACTURES): VIT D 25 HYDROXY: 44 ng/mL (ref 30–89)
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
## 2014-02-06 NOTE — Care Management Note (Signed)
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 S/P right BKA     Action/Plan:   PT/OT eval  Case manager sspoke with patient concerning HH and DME needs at discharge. Choice offered. [facility], will do so now. Referral called to Ayesha RumpfMary Yonjof, [facility] Liaison.   Anticipated DC Date:  02/06/2014   Anticipated DC Plan:  HOME W HOME HEALTH SERVICES      DC Planning Services  CM consult      [facility]  [facility]ALTH  DURABLE MEDICAL EQUIPMENT   Choice offered to / List presented to:  C-1 Patient   DME arranged  3-N-1  [facility]      DME agency  [facility]     HH arranged  HH-2 PT  HH-3 OT      [facility]   Status of service:  Completed, signed off Medicare Important Message given?  YES (If response is "NO", the following Medicare IM given date fields will be blank) Date Medicare IM given:  02/04/2014 Medicare IM given by:  Vance PeperBRADY,Jaking Thayer Date Additional Medicare IM given:   Additional Medicare IM given by:    Discharge Disposition:  HOME W HOME HEALTH SERVICES  Per UR Regulation:  Reviewed for med. necessity/level of care/duration of stay

## 2014-02-06 NOTE — Progress Notes (Signed)
I have read and agree with this note.   Time in/out:[id number] Total time:27 minutes (2SC)  Ignacia Palmaathy Philopateer Strine, OTR/L [phone]

## 2014-02-06 NOTE — Discharge Summary (Signed)
Physician Discharge Summary  Patient ID: Mary Long MRN: [id number] DOB/AGE: 08/19/1975 38 y.o.  Admit date: 02/03/2014 Discharge date: 02/06/2014  Admission Diagnoses:  Chronic osteomyelitis of right tibia  Discharge Diagnoses:  Principal Problem:   Chronic osteomyelitis of right tibia Active Problems:   Osteomyelitis, lower leg   Past Medical History  Diagnosis Date  . Hep C w/o coma, chronic   . Bipolar 1 disorder   . Depression   . Illicit drug use   . Seizures   . Blindness of left eye   . Complication of anesthesia     causes manic attacks and anxiety post anesthesia  . Chronic osteomyelitis of right tibia 02/03/2014    Surgeries: Procedure(s): RIGHT AMPUTATION LEG THROUGH TIBIA/FIBULA on 02/03/2014   Consultants (if any): Treatment Team:  Palliative [facility]  Discharged Condition: Improved  Hospital Course: Mary Long is an 38 y.o. female who was admitted 02/03/2014 with a diagnosis of Chronic osteomyelitis of right tibia and went to the operating room on 02/03/2014 and underwent the above named procedures.    She was given perioperative antibiotics:  Anti-infectives   Start     Dose/Rate Route Frequency Ordered Stop   02/03/14 1400  ceFAZolin (ANCEF) IVPB 2 g/50 mL premix     2 g 100 mL/hr over 30 Minutes Intravenous Every 6 hours 02/03/14 1358 02/04/14 0140   02/03/14 1013  vancomycin (VANCOCIN) 1 GM/200ML IVPB    Comments:  Scronce, Trina   : cabinet override      02/03/14 1013 02/03/14 2229   02/03/14 1012  vancomycin (VANCOCIN) 1 GM/200ML IVPB    Comments:  Scronce, Trina   : cabinet override      02/03/14 1012 02/03/14 1044   02/03/14 0600  ceFAZolin (ANCEF) IVPB 2 g/50 mL premix     2 g 100 mL/hr over 30 Minutes Intravenous On call to O.R. 02/02/14 1412 02/03/14 1100    .  She was given sequential compression devices, early mobility for DVT prophylaxis.  She benefited maximally from the hospital stay and there were no complications.     Recent vital signs:  Filed Vitals:   02/06/14 0642  BP: 127/43  Pulse: 81  Temp: 98.3 F (36.8 C)  Resp: 18    Recent laboratory studies:  Lab Results  Component Value Date   HGB 10.8* 02/06/2014   HGB 10.5* 02/05/2014   HGB 13.9 02/03/2014   Lab Results  Component Value Date   WBC 5.5 02/06/2014   PLT 268 02/06/2014   Lab Results  Component Value Date   INR 1.04 12/18/2012   Lab Results  Component Value Date   NA 137 02/06/2014   K 3.4* 02/06/2014   CL 99 02/06/2014   CO2 26 02/06/2014   BUN 7 02/06/2014   CREATININE 0.50 02/06/2014   GLUCOSE 147* 02/06/2014    Discharge Medications:     Medication List         ALPRAZolam 1 MG tablet  Commonly known as:  XANAX  Take 1 mg by mouth 3 (three) times daily.     amphetamine-dextroamphetamine 20 MG tablet  Commonly known as:  ADDERALL  Take 20 mg by mouth daily as needed (for when in school).     buPROPion 100 MG tablet  Commonly known as:  WELLBUTRIN  Take 100 mg by mouth 2 (two) times daily.     diazepam 5 MG tablet  Commonly known as:  VALIUM  Take 1 tablet (5 mg total)  by mouth every 6 (six) hours as needed for anxiety.     FLUoxetine 20 MG tablet  Commonly known as:  PROZAC  Take 20 mg by mouth at bedtime. Take with olanzapine     HYDROmorphone 2 MG tablet  Commonly known as:  DILAUDID  Take 1 tablet (2 mg total) by mouth every 4 (four) hours as needed for severe pain.     METHADONE HCL PO  Take 85 mg by mouth daily.     OLANZapine 5 MG tablet  Commonly known as:  ZYPREXA  Take 5 mg by mouth at bedtime. Take with fluoxetine     Oxycodone HCl 10 MG Tabs  Take 1 tablet (10 mg total) by mouth 4 (four) times daily as needed (severe pain).     PRESCRIPTION MEDICATION  Apply 1 application topically 3 (three) times daily as needed (for pain). A COMPOUNDED PAIN CREAM (with three or four components in it) that gets shipped to the pt     sennosides-docusate sodium 8.6-50 MG tablet  Commonly known as:   SENOKOT-S  Take 2 tablets by mouth daily.     zolpidem 10 MG tablet  Commonly known as:  AMBIEN  Take 10 mg by mouth at bedtime as needed for sleep. For sleep        Diagnostic Studies: Dg Knee Right Port  02/03/2014   CLINICAL DATA:  Post below-the-knee amputation.  EXAM: PORTABLE RIGHT KNEE - 1-2 VIEW  COMPARISON:  12/18/2012  FINDINGS: Changes of right below-the-knee amputation. Hardware noted in the distal femur with medial and lateral plate and screw fixation devices. No hardware complicating feature. No complicating features related to the amputation.  IMPRESSION: Below-the-knee amputation.  No complicating feature.   Electronically Signed   By: Charlett Nose M.D.   On: 02/03/2014 13:56    Disposition: 06-Home-Health Care Svc        Follow-up Information   Follow up with Eulas Post, MD. Schedule an appointment as soon as possible for a visit in 2 weeks.   Specialty:  Orthopedic Surgery   Contact information:   [address] [place] [address] [phone]        Signed: Eulas Post 02/06/2014, 11:16 AM

## 2014-02-06 NOTE — Progress Notes (Signed)
Occupational Therapy Treatment and Discharge Patient Details Name: Mary Long MRN: [id number] DOB: 06-23-1976 Today's Date: 02/06/2014    History of present illness 38 y.o. female s/p right transtibial amputation.  H/O bipolar, heroine abuse, chronic pain, on methadone.   OT comments  Focus of session was on reviewing LB ADL technique, how to manage ADL/IADLs with RW or from w/c level, and positioning of RLE. Educated and pt demonstrated a tub transfer to the tub bench. All education has been met and pt feels confident in self care tasks, pt would benefit from f/u of HHOT at D/C to maximize independence at home. No further acute OT is needed, we will sign off.  Follow Up Recommendations  Home health OT;Supervision/Assistance - 24 hour    Equipment Recommendations  3 in 1 bedside comode       Precautions / Restrictions Precautions Precautions: Fall Precaution Comments: Reviewed positioning of Residual Limb.   Restrictions Weight Bearing Restrictions: Yes RLE Weight Bearing: Non weight bearing       Mobility Bed Mobility Overal bed mobility: Modified Independent             General bed mobility comments: Requires extra time, no physical assist needed.  Transfers Overall transfer level: Needs assistance Equipment used: Rolling walker (2 wheeled) Transfers: Sit to/from Stand Sit to Stand: Supervision         General transfer comment: VC for lining body up with chair prior to sitting.    Balance Overall balance assessment: Needs assistance Sitting-balance support: No upper extremity supported;Feet supported Sitting balance-Leahy Scale: Good     Standing balance support: Bilateral upper extremity supported Standing balance-Leahy Scale: Poor                     ADL Overall ADL's : Needs assistance/impaired                                 Tub/ Shower Transfer: Tub transfer;Supervision/safety;Ambulation;Tub bench;Rolling walker    Functional mobility during ADLs: Supervision/safety;Rolling walker General ADL Comments: Reviewed positioning of residual limb with sitting, LB ADL sequence and technique, home safety and how to manage ADL/IADLs using RW and w/c; pt verbalized understanding. Educated and had pt demonstrate a tub transfer using the tub bench.                 Cognition   Behavior During Therapy: WFL for tasks assessed/performed Overall Cognitive Status: Within Functional Limits for tasks assessed                                    Pertinent Vitals/ Pain       No c/o pain during tx.         Frequency Min 2X/week     Progress Toward Goals  OT Goals(current goals can now be found in the care plan section)  Progress towards OT goals:  (All education completed)  Acute Rehab OT Goals Patient Stated Goal: go home OT Goal Formulation: With patient Time For Goal Achievement: 02/18/14 Potential to Achieve Goals: Good  Plan Discharge plan remains appropriate       End of Session Equipment Utilized During Treatment: Rolling walker   Activity Tolerance Patient tolerated treatment well   Patient Left in chair;with call bell/phone within reach           Time:  -  Charges:    Lyda Perone 02/06/2014, 10:21 AM

## 2014-02-06 NOTE — Progress Notes (Signed)
Patient ID: Mary Long, female   DOB: 01/18/1976, 38 y.o.   MRN: [id number]     Subjective:  Patient reports pain as mild to moderate.  Patient states that the pain is much improved. Patient states that the phantom pains are better  Objective:   VITALS:   Filed Vitals:   02/05/14 1434 02/05/14 1505 02/05/14 2153 02/06/14 0642  BP:  113/60 102/52 127/43  Pulse:   81 81  Temp:  98 F (36.7 C) 98.2 F (36.8 C) 98.3 F (36.8 C)  TempSrc:  Oral Oral Oral  Resp: 14 18 18 18   Weight:      SpO2: 97% 98% 92% 95%    ABD soft Sensation intact distally Dorsiflexion/Plantar flexion intact Incision: dressing C/D/I and no drainage Patient states that the phantom pains are better Sensation intact to the stump Splint in place and in good condition  Lab Results  Component Value Date   WBC 6.9 02/05/2014   HGB 10.5* 02/05/2014   HCT 34.2* 02/05/2014   MCV 82.2 02/05/2014   PLT 297 02/05/2014     Assessment/Plan: 3 Days Post-Op   Principal Problem:   Chronic osteomyelitis of right tibia Active Problems:   Osteomyelitis, lower leg   Advance diet Up with therapy Discharge home with home health Continue pain regimen  NWB  Splint at all times   Haskel KhanDOUGLAS PARRY, BRANDON 02/06/2014, 7:32 AM  Discussed and agree with above.  Teryl LucyJoshua Jax Abdelrahman, MD Cell [phone]

## 2014-02-06 NOTE — Progress Notes (Signed)
Physical Therapy Treatment Patient Details Name: Mary Long MRN: [id number] DOB: Jan 19, 1976 Today's Date: 02/06/2014    History of Present Illness 38 y.o. female s/p right transtibial amputation.  H/O bipolar, heroine abuse, chronic pain, on methadone.    PT Comments    Pt moving well and made great progress with mobility today.  Continued pt ed on positioning of residual limb.  Will continue to follow.    Follow Up Recommendations  Home health PT;Supervision for mobility/OOB     Equipment Recommendations  Rolling walker with 5" wheels;3in1 (PT)    Recommendations for Other Services       Precautions / Restrictions Precautions Precautions: Fall Precaution Comments: Reviewed positioning of Residual Limb.   Restrictions Weight Bearing Restrictions: Yes RLE Weight Bearing: Non weight bearing    Mobility  Bed Mobility Overal bed mobility: Modified Independent                Transfers Overall transfer level: Needs assistance Equipment used: Rolling walker (2 wheeled) Transfers: Sit to/from Stand Sit to Stand: Supervision         General transfer comment: cues for UE use and getting closer to bed prior to sitting.    Ambulation/Gait Ambulation/Gait assistance: Supervision Ambulation Distance (Feet): 150 Feet Assistive device: Rolling walker (2 wheeled) Gait Pattern/deviations: Step-to pattern     General Gait Details: cues for RW management, positioning R LE.     Stairs            Wheelchair Mobility    Modified Rankin (Stroke Patients Only)       Balance Overall balance assessment: Needs assistance Sitting-balance support: No upper extremity supported;Feet supported Sitting balance-Leahy Scale: Good     Standing balance support: Bilateral upper extremity supported Standing balance-Leahy Scale: Poor                      Cognition Arousal/Alertness: Awake/alert Behavior During Therapy: WFL for tasks  assessed/performed Overall Cognitive Status: Within Functional Limits for tasks assessed                      Exercises      General Comments General comments (skin integrity, edema, etc.): pt ed on residual limb positioning and working on hip/knee extension for planning for prosthetic in future.        Pertinent Vitals/Pain Residual Limb and Phantom pain.  Pt rang call button for RN.      Home Living                      Prior Function            PT Goals (current goals can now be found in the care plan section) Acute Rehab PT Goals Patient Stated Goal: go home Time For Goal Achievement: 02/11/14 Potential to Achieve Goals: Good Progress towards PT goals: Progressing toward goals    Frequency  Min 5X/week    PT Plan Current plan remains appropriate    Co-evaluation             End of Session Equipment Utilized During Treatment: Gait belt Activity Tolerance: Patient tolerated treatment well Patient left: in bed;with call bell/phone within reach     Time: 0839-0852 PT Time Calculation (min): 13 min  Charges:  $Gait Training: 8-22 mins                    G Codes:      Mary Long,  Mary MurrayMegan Long, [place]PT [phone] 02/06/2014, 9:23 AM

## 2014-02-06 NOTE — Progress Notes (Signed)
Thank you for consult on Mary Long. Chart reviewed and patient examined. She is doing well today and walked around the unit with min/guard assist. She states that would prefer to go home and her boyfriend is very supportive and can  provide assistance as needed. Will defer CIR consult and recommend HHPT/OT for follow up past discharge.

## 2014-02-07 ENCOUNTER — Encounter (HOSPITAL_COMMUNITY): Payer: Self-pay | Admitting: Orthopedic Surgery

## 2014-02-12 LAB — VITAMIN D 1,25 DIHYDROXY
VITAMIN D3 1, 25 (OH): 135 pg/mL
Vitamin D 1, 25 (OH)2 Total: 135 pg/mL — ABNORMAL HIGH (ref 18–72)

## 2014-05-11 ENCOUNTER — Ambulatory Visit: Payer: Medicare Other | Attending: Orthopedic Surgery | Admitting: Physical Therapy

## 2014-05-25 ENCOUNTER — Encounter: Payer: Self-pay | Admitting: Physical Therapy

## 2014-05-25 ENCOUNTER — Ambulatory Visit: Payer: Medicare Other | Attending: Orthopedic Surgery | Admitting: Physical Therapy

## 2014-05-25 VITALS — BP 121/81 | HR 103

## 2014-05-25 DIAGNOSIS — R269 Unspecified abnormalities of gait and mobility: Secondary | ICD-10-CM | POA: Diagnosis not present

## 2014-05-25 DIAGNOSIS — Z89511 Acquired absence of right leg below knee: Secondary | ICD-10-CM | POA: Diagnosis present

## 2014-05-25 NOTE — Therapy (Signed)
Physical Therapy Evaluation  Patient Details  Name: Mary Long MRN: [id number] Date of Birth: 1975/10/13  Encounter Date: 05/25/2014      PT End of Session - 05/25/14 1522    Visit Number 1   Number of Visits 1   PT Start Time 1030   PT Stop Time 1140   PT Time Calculation (min) 70 min   Equipment Utilized During Treatment Other (comment)  I-Walk aide   Activity Tolerance Patient tolerated treatment well   Behavior During Therapy [facility] for tasks assessed/performed      Past Medical History  Diagnosis Date  . Hep C w/o coma, chronic   . Bipolar 1 disorder   . Depression   . Illicit drug use   . Seizures   . Blindness of left eye   . Complication of anesthesia     causes manic attacks and anxiety post anesthesia  . Chronic osteomyelitis of right tibia 02/03/2014    Past Surgical History  Procedure Laterality Date  . Fracture surgery    . Tubal ligation  01/01/2001  . I&d of 4 abscesses to right arm and wrist  06/18/2003  . Orif femur fracture Right 12/18/2012    Procedure: OPEN REDUCTION INTERNAL FIXATION (ORIF) DISTAL FEMUR FRACTURE;  Surgeon: Eulas Post, MD;  Location: [facility] OR;  Service: Orthopedics;  Laterality: Right;  . Amputation Right 02/03/2014    Procedure: RIGHT AMPUTATION LEG THROUGH TIBIA/FIBULA;  Surgeon: Eulas Post, MD;  Location: [facility] OR;  Service: Orthopedics;  Laterality: Right;    BP 121/81 mmHg  Pulse 103  SpO2 97%  Visit Diagnosis:  Abnormality of gait  Status post below knee amputation of right lower extremity      Subjective Assessment - 05/25/14 1037    Symptoms She presents to PT to help justify getting a prosthesis   Patient Stated Goals Wants a prosthesis to travel, walk in community, exercise, resume activity level to independent   Currently in Pain? Yes   Pain Score 0-No pain  But increased to 7/10 in last week   Pain Location Leg  mainly in knee   Pain Orientation Right   Pain Descriptors / Indicators Throbbing;Other  (Comment)  deep   Pain Type Chronic pain   Pain Onset More than a month ago   Pain Frequency Occasional   Aggravating Factors  increased activity level & mode   Pain Relieving Factors rest & occassional pain medications   Effect of Pain on Daily Activities Stops activities to relieve pain   Multiple Pain Sites No          [facility] PT Assessment - 05/25/14 1030    Assessment   Medical Diagnosis Right Transtibial Amputation   Onset Date 02/03/14   Next MD Visit --  couple of weeks   Precautions   Precautions None   Restrictions   Weight Bearing Restrictions No   Balance Screen   Has the patient fallen in the past 6 months Yes   How many times? 2   Has the patient had a decrease in activity level because of a fear of falling?  No   Is the patient reluctant to leave their home because of a fear of falling?  No   Home Environment   Living Enviornment Private residence   Living Arrangements Spouse/significant other   Type of Home Other(Comment)   Home Access --  condo   Home Layout One level   Home Equipment Walker - 2 wheels;Cane - single  point;Crutches;Tub bench;Wheelchair - Careers advisermanual;Other (comment)  Amputation I-Walk Free   Prior Function   Level of Independence Independent with basic ADLs;Independent with gait;Independent with transfers   Vocation Full time employment;Other (comment)  Tree surgeonprogram director [employer]   Vocation Requirements stand & walk   Leisure travel, hike, camp   Cognition   Overall Cognitive Status Within Functional Limits for tasks assessed   Observation/Other Assessments   Focus on Therapeutic Outcomes (FOTO)  46  Functional Status by patient survey   AROM   Right Knee Extension -15   PROM   Left Hip Extension -3  Thomas Position   Strength   Right Hip Extension 5/5   Right Hip ABduction 5/5   Left Hip Extension 5/5   Left Hip ABduction 5/5   Right Knee Flexion 5/5   Right Knee Extension 5/5   Left Knee Flexion 5/5   Left Knee Extension  5/5   Left Ankle Dorsiflexion 5/5   Transfers   Sit to Stand 7: Independent   Stand to Sit 7: Independent   Stand Pivot Transfers 7: Independent   Ambulation/Gait   Ambulation/Gait Yes   Ambulation/Gait Assistance 6: Modified independent (Device/Increase time);Other (comment)  Modified by I-Walk aide   Ambulation Distance (Feet) 250 Feet   Assistive device Other (Comment)  I-Walk Aide   Gait Pattern Step-through pattern;Decreased stance time - right   Gait velocity 3.07 ft/sec at comfortable pace & 3.98 ft/sec at fast pace   Stairs Yes   Stairs Assistance 6: Modified independent (Device/Increase time)   Stair Management Technique One rail Right;No rails;Step to pattern;Other (comment)  I-Walk Aide, ascend no rail, descend   Number of Stairs 4   Berg Balance Test   Sit to Stand Able to stand without using hands and stabilize independently   Standing Unsupported Able to stand safely 2 minutes   Sitting with Back Unsupported but Feet Supported on Floor or Stool Able to sit safely and securely 2 minutes   Stand to Sit Sits safely with minimal use of hands   Transfers Able to transfer safely, minor use of hands   Standing Unsupported with Eyes Closed Able to stand 10 seconds safely   Standing Ubsupported with Feet Together Able to place feet together independently and stand 1 minute safely   From Standing, Reach Forward with Outstretched Arm Can reach confidently >25 cm (10")   From Standing Position, Pick up Object from Floor Able to pick up shoe safely and easily   From Standing Position, Turn to Look Behind Over each Shoulder Looks behind from both sides and weight shifts well   Turn 360 Degrees Able to turn 360 degrees safely in 4 seconds or less   Standing Unsupported, Alternately Place Feet on Step/Stool Able to stand independently and safely and complete 8 steps in 20 seconds   Standing Unsupported, One Foot in Front Able to plae foot ahead of the other independently and hold 30  seconds  I-Walk device limited ability to stand tandem   Standing on One Leg Able to lift leg independently and hold > 10 seconds   Dynamic Gait Index   Level Surface Mild Impairment   Change in Gait Speed Mild Impairment   Gait with Horizontal Head Turns Mild Impairment   Gait with Vertical Head Turns Mild Impairment   Gait and Pivot Turn Mild Impairment   Step Over Obstacle Moderate Impairment   Step Around Obstacles Mild Impairment   Steps Mild Impairment   Total Score 15  DGI score is at max level possible when ambulating with I-Walk device. She has potential with prosthesis for DGI 24/24  ROM measurements were PROM. Right Hip Extension in [employer].     [facility] Adult PT Treatment/Exercise - 06-07-14 1030    Knee/Hip Exercises: Stretches   Active Hamstring Stretch 5 reps   Knee/Hip Exercises: Supine   Quad Sets AROM;10 reps   Knee/Hip Exercises: Prone   Prone Knee Hang 5 minutes   Prone Knee Hang Weights (lbs) 5  PT demo /instructed how boyfriend to manually apply pressure          PT Education - 06-07-2014 1519    Education provided Yes   Education Details residual limb care including shrinker wear, not shaving (laser hair removal option), stretches for knee extension   Person(s) Educated Patient   Methods Explanation;Demonstration   Comprehension Verbalized understanding;Returned demonstration              Plan - 2014-06-07 1522    Clinical Impression Statement This 38yo female appears to be an excellent candidate for a Transtibial Prosthesis. Without a prosthesis she is significantly limited in all standing activities including gait & has a high fall risk. She purchased an I-Walk device that allows limited mobility in bent-knee position on single point contact (Peg Leg style) with no knee movement (her knee stays bent at 90 & device is completely extended /straight even in sitting. This was an excellent device to determine potential but is not a long term  answer.     Pt will benefit from skilled therapeutic intervention in order to improve on the following deficits Impaired flexibility   Rehab Potential Excellent   Clinical Impairments Affecting Rehab Potential Current use of I-Walk by its design is facitating knee flexion contracture and walking with stiff knee is causing gait abnormalities, safety issues & pain with abnormal movement pattern.   PT Frequency 1x / week  Evaluation Only until she recieves a prosthesis   PT Treatment/Interventions ADLs/Self Care Home Management;Patient/family education;Therapeutic exercise   PT Next Visit Plan Evaluation only until recieves a prosthesis   Recommended Other Services This patient has potential to use a prosthesis to access full community (various surfaces like grass or sand, ramp, curb, stairs) at variable cadence (K3 functional level). PT recommends a PTB total contact socket, silicon gel liner with shuttle pin lock and K3 Veri-Flex XC foot.   Consulted and Agree with Plan of Care Patient          G-Codes - 2014/06/07 1559    Functional Assessment Tool Used ambulates with I-Walk 250' modified independent.   Functional Limitation Mobility: Walking and moving around   Mobility: Walking and Moving Around Current Status [phone]) At least 20 percent but less than 40 percent impaired, limited or restricted   Mobility: Walking and Moving Around Goal Status [phone]) At least 20 percent but less than 40 percent impaired, limited or restricted   Mobility: Walking and Moving Around Discharge Status [phone]) At least 20 percent but less than 40 percent impaired, limited or restricted      Problem List Patient Active Problem List   Diagnosis Date Noted  . Chronic osteomyelitis of right tibia 02/03/2014  . Osteomyelitis, lower leg 02/03/2014  . Fracture of distal femur 12/18/2012          Prosthetics Assessment - 07-Jun-2014 1030    Edema Right edema: 3cm distal inferior patella 32.5cm, 8cm inferior  31.8cm, 13cm inferior 32.6cm  Non-pitting.   Residual limb condition  limb length 18.5cm, bone length 17cm, 3 opening on scar ~801mm, short hair from shaving,   K code/activity level with prosthetic use  3  Patient has potential to function with prosthesis.        Ruey Storer  PT 05/25/2014, 4:03 PM

## 2014-07-12 ENCOUNTER — Ambulatory Visit: Payer: Medicare Other

## 2014-07-19 ENCOUNTER — Encounter: Payer: Self-pay | Admitting: Physical Therapy

## 2014-07-19 ENCOUNTER — Ambulatory Visit: Payer: Medicare Other | Attending: Orthopedic Surgery | Admitting: Physical Therapy

## 2014-07-19 DIAGNOSIS — R269 Unspecified abnormalities of gait and mobility: Secondary | ICD-10-CM | POA: Insufficient documentation

## 2014-07-19 DIAGNOSIS — Z89511 Acquired absence of right leg below knee: Secondary | ICD-10-CM | POA: Diagnosis present

## 2014-07-20 NOTE — Therapy (Signed)
[facility] [address] [place], [place], [address] Phone: [phone]   Fax:  [phone]  Physical Therapy Evaluation  Patient Details  Name: Mary Long MRN: [id number] Date of Birth: [birth year] Referring Provider:  Jackie Plum, MD  Encounter Date: 07/19/2014      PT End of Session - 07/20/14 1251    Visit Number 1   Number of Visits 17   Date for PT Re-Evaluation 09/15/14   PT Start Time 0845   PT Stop Time 0915   PT Time Calculation (min) 30 min   Equipment Utilized During Treatment Gait belt   Activity Tolerance Patient tolerated treatment well   Behavior During Therapy [facility] for tasks assessed/performed      Past Medical History  Diagnosis Date  . Hep C w/o coma, chronic   . Bipolar 1 disorder   . Depression   . Illicit drug use   . Seizures   . Blindness of left eye   . Complication of anesthesia     causes manic attacks and anxiety post anesthesia  . Chronic osteomyelitis of right tibia 02/03/2014    Past Surgical History  Procedure Laterality Date  . Fracture surgery    . Tubal ligation  01/01/2001  . I&d of 4 abscesses to right arm and wrist  06/18/2003  . Orif femur fracture Right 12/18/2012    Procedure: OPEN REDUCTION INTERNAL FIXATION (ORIF) DISTAL FEMUR FRACTURE;  Surgeon: Eulas Post, MD;  [facility]: [facility] OR;  Service: Orthopedics;  Laterality: Right;  . Amputation Right 02/03/2014    Procedure: RIGHT AMPUTATION LEG THROUGH TIBIA/FIBULA;  Surgeon: Eulas Post, MD;  Location: [facility] OR;  Service: Orthopedics;  Laterality: Right;    There were no vitals taken for this visit.  Visit Diagnosis:  Abnormality of gait - Plan: PT plan of care cert/re-cert  Status post below knee amputation of right lower extremity - Plan: PT plan of care cert/re-cert      Subjective Assessment - 07/20/14 1248    Symptoms She underwent a right Transtibial Amputation on 02/03/14. She recieved first  prosthesis on 06/22/14. She presents for PT evaluation & prosthetic training.           Prosthetics Assessment - 07/20/14 0001    Prosthetic Care Dependent with Skin check;Residual limb care;Prosthetic cleaning;Ply sock cleaning;Correct ply sock adjustment;Proper wear schedule/adjustment  Donning prosthesis   Donning prosthesis  Supervision  cues on proper technique   Doffing prosthesis  Modified independent (Device/Increase time)   Current prosthetic wear tolerance (days/week)  7 days /wk    Current prosthetic wear tolerance (#hours/day)  2 hours/day 1-2 x/day                 [facility] Adult PT Treatment/Exercise - 07/20/14 0001    Prosthetics   Education Provided Skin check;Residual limb care;Prosthetic cleaning;Ply sock cleaning;Correct ply sock adjustment;Proper wear schedule/adjustment  donning prosthesis   Person(s) Educated Patient   Education Method Explanation;Demonstration   Education Method Verbalized understanding;Needs further instruction                PT Education - 07/19/14 0845    Education provided Yes   Education Details See prosthetic care   Person(s) Educated Patient   Methods Explanation;Demonstration   Comprehension Verbalized understanding          PT Short Term Goals - 07/19/14 0845    PT SHORT TERM GOAL #1   Title demonstrates proper prosthetic care except cues to  adjust ply socks. (Target Date: 08/18/14)   Time 4   Period Weeks   Status New   PT SHORT TERM GOAL #2   Title tolerates wear of prosthesis >10 hours /day without change in skin integrity or pain /discomfort. (Target Date: 08/18/14)   Time 4   Period Weeks   Status New   PT SHORT TERM GOAL #3   Title ambulates 500' with prosthesis only with cues. (Target Date: 08/18/14)   Time 4   Period Weeks   Status New   PT SHORT TERM GOAL #4   Title negotiates ramp, curb & stairs (1 rail) with prosthesis only with cues. (Target Date: 08/18/14)           PT Long Term Goals -  07/20/14 1258    PT LONG TERM GOAL #1   Title Independent with prosthetic care. (Target Date: 09/16/14)   Time 8   Period Weeks   Status New   PT LONG TERM GOAL #2   Title Tolerates wear of prosthesis >90% of awake hours without change in skin integrity or pain/discomfort. (Target Date: 09/16/14)   Time 8   Period Weeks   Status New   PT LONG TERM GOAL #3   Title Dynamic Gait Index >19/24 (Target Date: 09/16/14)   Time 8   Period Weeks   Status New   PT LONG TERM GOAL #4   Title lift 25# & carry >100' with prosthesis only independently with correct mechanics. (Target Date: 09/16/14)   Time 8   Period Weeks   Status New               Plan - 07/20/14 1252    Clinical Impression Statement This 38yo female recieved her first prosthesis on 06/22/14 and is dependent in care/use. She needs skilled instruction to maximize safety & function to return to prior level of activity.   Pt will benefit from skilled therapeutic intervention in order to improve on the following deficits Abnormal gait;Decreased activity tolerance;Decreased mobility;Decreased balance   Rehab Potential Excellent   PT Frequency 2x / week   PT Duration 8 weeks   PT Treatment/Interventions ADLs/Self Care Home Management;Gait training;Stair training;Functional mobility training;Therapeutic activities;Therapeutic exercise;Balance training;Neuromuscular re-education;Patient/family education;Other (comment)  prosthetic training   PT Next Visit Plan Review prosthetic care, gait on barriers   PT Home Exercise Plan Midline HEP at sink   Consulted and Agree with Plan of Care Patient          G-Codes - 07/19/14 0845    Functional Assessment Tool Used dependent in prosthetic care. Wears prosthesis 2 hours 1-2 x/day   Functional Limitation Self care   Mobility: Walking and Moving Around Current Status [phone]([id number]) At least 60 percent but less than 80 percent impaired, limited or restricted   Mobility: Walking and Moving Around  Goal Status [phone]([id number]) 0 percent impaired, limited or restricted       Problem List Patient Active Problem List   Diagnosis Date Noted  . Chronic osteomyelitis of right tibia 02/03/2014  . Osteomyelitis, lower leg 02/03/2014  . Fracture of distal femur 12/18/2012    Terell Kincy 07/20/2014, 1:11 PM  [facility] [address] [place], [place], [address] Phone: [phone]   Fax:  [phone]    Vladimir Fasterobin Taivon Haroon, PT, DPT PT Specializing in Prosthetics & Orthotics 07/20/2014 1:11 PM Phone:  [phone]  Fax:  [phone] [facility] [address] [place], [place] [address]

## 2014-07-24 ENCOUNTER — Ambulatory Visit: Payer: Medicare Other | Admitting: Physical Therapy

## 2014-07-27 ENCOUNTER — Ambulatory Visit: Payer: Medicare Other | Admitting: Physical Therapy

## 2014-07-31 ENCOUNTER — Encounter: Payer: Self-pay | Admitting: Physical Therapy

## 2014-07-31 ENCOUNTER — Ambulatory Visit: Payer: Medicare Other | Admitting: Physical Therapy

## 2014-07-31 DIAGNOSIS — R269 Unspecified abnormalities of gait and mobility: Secondary | ICD-10-CM | POA: Diagnosis not present

## 2014-07-31 DIAGNOSIS — Z89511 Acquired absence of right leg below knee: Secondary | ICD-10-CM

## 2014-07-31 NOTE — Therapy (Signed)
[facility] [address] [place], [place], [address] Phone: [phone]   Fax:  [phone]  Physical Therapy Treatment  Patient Details  Name: Mary Long MRN: [id number] Date of Birth: 05-15-1976 Referring Provider:  Jackie Plumsei-Bonsu, George, MD  Encounter Date: 07/31/2014      PT End of Session - 07/31/14 1400    Visit Number 2   Number of Visits 17   Date for PT Re-Evaluation 09/15/14   PT Start Time 1410   PT Stop Time 1448   PT Time Calculation (min) 38 min   Equipment Utilized During Treatment Gait belt   Activity Tolerance Patient tolerated treatment well   Behavior During Therapy [facility] for tasks assessed/performed      Past Medical History  Diagnosis Date  . Hep C w/o coma, chronic   . Bipolar 1 disorder   . Depression   . Illicit drug use   . Seizures   . Blindness of left eye   . Complication of anesthesia     causes manic attacks and anxiety post anesthesia  . Chronic osteomyelitis of right tibia 02/03/2014    Past Surgical History  Procedure Laterality Date  . Fracture surgery    . Tubal ligation  01/01/2001  . I&d of 4 abscesses to right arm and wrist  06/18/2003  . Orif femur fracture Right 12/18/2012    Procedure: OPEN REDUCTION INTERNAL FIXATION (ORIF) DISTAL FEMUR FRACTURE;  Surgeon: Eulas PostJoshua P Landau, MD;  [facility];  Service: Orthopedics;  Laterality: Right;  . Amputation Right 02/03/2014    Procedure: RIGHT AMPUTATION LEG THROUGH TIBIA/FIBULA;  Surgeon: Eulas PostJoshua P Landau, MD;  [facility]: [facility];  Service: Orthopedics;  Laterality: Right;    There were no vitals taken for this visit.  Visit Diagnosis:  Abnormality of gait  Status post below knee amputation of right lower extremity      Subjective Assessment - 07/31/14 1551    Symptoms Wore prosthesis 2 hours 2x/day as advised. Knee is hurting some.   Currently in Pain? Yes   Pain Score 5    Pain Location Knee   Pain Orientation  Right   Pain Descriptors / Indicators Aching   Multiple Pain Sites No                    [facility] Adult PT Treatment/Exercise - 07/31/14 1400    Ambulation/Gait   Ambulation/Gait Yes   Ambulation/Gait Assistance 5: Supervision   Ambulation/Gait Assistance Details verbal & visual cues on step width   Ambulation Distance (Feet) 300 Feet   Assistive device Prosthesis;None   Prosthetics   Prosthetic Care Comments  PT demo use of cut-off sock to tighten distal limb   Current prosthetic wear tolerance (days/week)  7 days /wk    Current prosthetic wear tolerance (#hours/day)  PT increased 3 hours 2x/day   Residual limb condition  no open areas   Education Provided Skin check;Residual limb care;Proper wear schedule/adjustment;Correct ply sock adjustment  proper donning   Person(s) Educated Patient   Education Method Explanation;Demonstration   Education Method Verbalized understanding;Returned demonstration;Needs further instruction   Donning Prosthesis Supervision  cues on pin alignment   Doffing Prosthesis Modified independent (device/increased time)     PT instructed in standing with equal wt distribution on lower extremities. Patient return demo with tactile cues and verbalized understanding.         PT Education - 07/31/14 1600    Education provided Yes   Education  Details see prosthetic care   Person(s) Educated Patient   Methods Explanation;Demonstration   Comprehension Verbalized understanding;Returned demonstration;Need further instruction          PT Short Term Goals - 07/19/14 0845    PT SHORT TERM GOAL #1   Title demonstrates proper prosthetic care except cues to adjust ply socks. (Target Date: 08/18/14)   Time 4   Period Weeks   Status New   PT SHORT TERM GOAL #2   Title tolerates wear of prosthesis >10 hours /day without change in skin integrity or pain /discomfort. (Target Date: 08/18/14)   Time 4   Period Weeks   Status New   PT SHORT TERM GOAL #3    Title ambulates 500' with prosthesis only with cues. (Target Date: 08/18/14)   Time 4   Period Weeks   Status New   PT SHORT TERM GOAL #4   Title negotiates ramp, curb & stairs (1 rail) with prosthesis only with cues. (Target Date: 08/18/14)           PT Long Term Goals - 07/20/14 1258    PT LONG TERM GOAL #1   Title Independent with prosthetic care. (Target Date: 09/16/14)   Time 8   Period Weeks   Status New   PT LONG TERM GOAL #2   Title Tolerates wear of prosthesis >90% of awake hours without change in skin integrity or pain/discomfort. (Target Date: 09/16/14)   Time 8   Period Weeks   Status New   PT LONG TERM GOAL #3   Title Dynamic Gait Index >19/24 (Target Date: 09/16/14)   Time 8   Period Weeks   Status New   PT LONG TERM GOAL #4   Title lift 25# & carry >100' with prosthesis only independently with correct mechanics. (Target Date: 09/16/14)   Time 8   Period Weeks   Status New               Plan - 07/31/14 1410    Clinical Impression Statement She improved gait with instruction in standing & walking with prosthesis not abducted. Patient reported fit felt better with use of cut-off sock distally.   Pt will benefit from skilled therapeutic intervention in order to improve on the following deficits Abnormal gait;Decreased activity tolerance;Decreased mobility;Decreased balance   Rehab Potential Excellent   PT Frequency 2x / week   PT Duration 8 weeks   PT Treatment/Interventions ADLs/Self Care Home Management;Gait training;Stair training;Functional mobility training;Therapeutic activities;Therapeutic exercise;Balance training;Neuromuscular re-education;Patient/family education;Other (comment)  prosthetic training   PT Next Visit Plan Review prosthetic care, gait on barriers   PT Home Exercise Plan Midline HEP at sink   Consulted and Agree with Plan of Care Patient        Problem List Patient Active Problem List   Diagnosis Date Noted  . Chronic  osteomyelitis of right tibia 02/03/2014  . Osteomyelitis, lower leg 02/03/2014  . Fracture of distal femur 12/18/2012    Zanylah Hardie 07/31/2014, 4:03 PM  [facility] [address] [place], [place], [address] Phone: [phone]   Fax:  [phone]  Vladimir Faster, PT, DPT PT Specializing in Prosthetics & Orthotics (<PARAMETER> error)@ 4:06 PM Phone:  [phone]  Fax:  [phone] [facility] [address] [place], [place] [address]

## 2014-08-03 ENCOUNTER — Ambulatory Visit: Payer: Medicare Other | Admitting: Physical Therapy

## 2014-08-07 ENCOUNTER — Ambulatory Visit: Payer: Medicare Other | Admitting: Physical Therapy

## 2014-08-09 ENCOUNTER — Ambulatory Visit: Payer: Medicare Other | Admitting: Physical Therapy

## 2014-08-14 ENCOUNTER — Ambulatory Visit: Payer: Medicare Other | Attending: Orthopedic Surgery | Admitting: Physical Therapy

## 2014-08-14 DIAGNOSIS — R269 Unspecified abnormalities of gait and mobility: Secondary | ICD-10-CM | POA: Insufficient documentation

## 2014-08-14 DIAGNOSIS — Z89511 Acquired absence of right leg below knee: Secondary | ICD-10-CM | POA: Insufficient documentation

## 2014-08-17 ENCOUNTER — Ambulatory Visit: Payer: Medicare Other | Admitting: Physical Therapy

## 2014-08-17 ENCOUNTER — Encounter: Payer: Self-pay | Admitting: Physical Therapy

## 2014-08-17 DIAGNOSIS — R269 Unspecified abnormalities of gait and mobility: Secondary | ICD-10-CM | POA: Diagnosis present

## 2014-08-17 DIAGNOSIS — Z89511 Acquired absence of right leg below knee: Secondary | ICD-10-CM

## 2014-08-18 NOTE — Therapy (Signed)
[facility] [address] [place], [place], [address] Phone: [phone]   Fax:  [phone]  Physical Therapy Treatment  Patient Details  Name: Mary Long MRN: [id number] Date of Birth: 21-Jul-1975 Referring Provider:  Benito Mccreedy, MD  Encounter Date: 08/17/2014      PT End of Session - 08/18/14 1210    Visit Number 3   Number of Visits 17   Date for PT Re-Evaluation 09/15/14   PT Start Time 4665   PT Stop Time 1356   PT Time Calculation (min) 38 min   Equipment Utilized During Treatment Gait belt   Activity Tolerance Patient tolerated treatment well   Behavior During Therapy [facility] for tasks assessed/performed      Past Medical History  Diagnosis Date  . Hep C w/o coma, chronic   . Bipolar 1 disorder   . Depression   . Illicit drug use   . Seizures   . Blindness of left eye   . Complication of anesthesia     causes manic attacks and anxiety post anesthesia  . Chronic osteomyelitis of right tibia 02/03/2014    Past Surgical History  Procedure Laterality Date  . Fracture surgery    . Tubal ligation  01/01/2001  . I&d of 4 abscesses to right arm and wrist  06/18/2003  . Orif femur fracture Right 12/18/2012    Procedure: OPEN REDUCTION INTERNAL FIXATION (ORIF) DISTAL FEMUR FRACTURE;  Surgeon: Johnny Bridge, MD;  Location: [facility];  Service: Orthopedics;  Laterality: Right;  . Amputation Right 02/03/2014    Procedure: RIGHT AMPUTATION LEG THROUGH TIBIA/FIBULA;  Surgeon: Johnny Bridge, MD;  Location: [facility];  Service: Orthopedics;  Laterality: Right;    There were no vitals taken for this visit.  Visit Diagnosis:  Abnormality of gait  Status post below knee amputation of right lower extremity      Subjective Assessment - 08/17/14 1324    Symptoms No new complaints. No falls.    Currently in Pain? Yes   Pain Score 4    Pain Location Knee   Pain Orientation Right   Pain Descriptors / Indicators  Sore;Aching   Pain Type Chronic pain   Pain Onset More than a month ago   Pain Frequency Occasional   Aggravating Factors  prosthesis wear, activity   Pain Relieving Factors rest and removing prosthesis          [facility] Adult PT Treatment/Exercise - 08/17/14 1326    Ambulation/Gait   Ambulation/Gait Yes   Ambulation/Gait Assistance 5: Supervision   Ambulation/Gait Assistance Details pt with longer leg length on right (prosthesis) side. Heel lift added to left shoe with pt being more level.   Ambulation Distance (Feet) 230 Feet  x 2 reps   Assistive device Prosthesis;None   Gait Pattern Step-through pattern;Decreased stance time - right;Decreased step length - left   Ambulation Surface Level;Indoor   Ramp 5: Supervision  with prosthesis   Ramp Details (indicate cue type and reason) cues on sequence and technique  x5 reps   Curb 4: Min assist  min guard assist   Curb Details (indicate cue type and reason) cues on sequence/technique   Prosthetics   Prosthetic Care Comments  Discussed use of baby oil/deodorant (Secret) for heat rash/sweating.   Current prosthetic wear tolerance (days/week)  7 days /wk    Current prosthetic wear tolerance (#hours/day)  3-4 hours 2x day most days   Residual limb condition  intact, does have  heat rash present   Education Provided Residual limb care;Proper wear schedule/adjustment;Correct ply sock adjustment  portable left foot accelerator info   Person(s) Educated Patient   Education Method Explanation;Demonstration;Verbal cues;Handout   Education Method Verbalized understanding;Verbal cues required   Donning Prosthesis Supervision   Doffing Prosthesis Supervision           PT Short Term Goals - 08/17/14 1400    PT SHORT TERM GOAL #1   Title demonstrates proper prosthetic care except cues to adjust ply socks. (Target Date: 08/18/14)   Time 4   Period Weeks   Status Achieved   PT SHORT TERM GOAL #2   Title tolerates wear of prosthesis >10  hours /day without change in skin integrity or pain /discomfort. (Target Date: 08/18/14)   Time 4   Period Weeks   Status Not Met   PT SHORT TERM GOAL #3   Title ambulates 500' with prosthesis only with cues. (Target Date: 08/18/14)   Time 4   Period Weeks   Status Achieved   PT SHORT TERM GOAL #4   Title negotiates ramp, curb & stairs (1 rail) with prosthesis only with cues. (Target Date: 08/18/14)   Status Not Met           PT Long Term Goals - 07/20/14 1258    PT LONG TERM GOAL #1   Title Independent with prosthetic care. (Target Date: 09/16/14)   Time 8   Period Weeks   Status New   PT LONG TERM GOAL #2   Title Tolerates wear of prosthesis >90% of awake hours without change in skin integrity or pain/discomfort. (Target Date: 09/16/14)   Time 8   Period Weeks   Status New   PT LONG TERM GOAL #3   Title Dynamic Gait Index >19/24 (Target Date: 09/16/14)   Time 8   Period Weeks   Status New   PT LONG TERM GOAL #4   Title lift 25# & carry >100' with prosthesis only independently with correct mechanics. (Target Date: 09/16/14)   Time 8   Period Weeks   Status New           Plan - 08/18/14 1211    Clinical Impression Statement Pt has met some STG's at this time, progressing toward others after today's session. Progress has been limited due to pt's limited attendance. Discussed with pt her missed apt's and that regular attendance would help her to achieve the most optimal results. After discussion with primary PT it was decided to decreased pt's frequency to 1x week to better meet pt's busy schedule demands and give her the best chance of improved attendance.                                       Pt will benefit from skilled therapeutic intervention in order to improve on the following deficits Abnormal gait;Decreased activity tolerance;Decreased mobility;Decreased balance   Rehab Potential Excellent   PT Frequency 2x / week   PT Duration 8 weeks   PT Treatment/Interventions  ADLs/Self Care Home Management;Gait training;Stair training;Functional mobility training;Therapeutic activities;Therapeutic exercise;Balance training;Neuromuscular re-education;Patient/family education;Other (comment)  prosthetic training   PT Next Visit Plan Review prosthetic care, gait on barriers   PT Home Exercise Plan Midline HEP at sink   Consulted and Agree with Plan of Care Patient      Problem List Patient Active Problem List   Diagnosis Date Noted  .  Chronic osteomyelitis of right tibia 02/03/2014  . Osteomyelitis, lower leg 02/03/2014  . Fracture of distal femur 12/18/2012    Willow Ora 08/18/2014, 12:16 PM  Willow Ora, PTA, [facility] [address], [place], [place] [address] [phone] 08/18/2014, 12:16 PM

## 2014-08-22 ENCOUNTER — Ambulatory Visit: Payer: Medicare Other | Admitting: Physical Therapy

## 2014-08-22 ENCOUNTER — Encounter: Payer: Self-pay | Admitting: Physical Therapy

## 2014-08-22 DIAGNOSIS — Z89511 Acquired absence of right leg below knee: Secondary | ICD-10-CM

## 2014-08-22 DIAGNOSIS — R269 Unspecified abnormalities of gait and mobility: Secondary | ICD-10-CM | POA: Diagnosis not present

## 2014-08-22 NOTE — Therapy (Signed)
[facility] [address] [place], [place], [address] Phone: [phone]   Fax:  [phone]  Physical Therapy Treatment  Patient Details  Name: Mary Long MRN: [id number] Date of Birth: [birth year] Referring Provider:  Benito Mccreedy, MD  Encounter Date: 08/22/2014      PT End of Session - 08/22/14 1457    Visit Number 4   Number of Visits 17   Date for PT Re-Evaluation 09/15/14   PT Start Time 1450   PT Stop Time 1530   PT Time Calculation (min) 40 min   Equipment Utilized During Treatment Gait belt   Activity Tolerance Patient tolerated treatment well   Behavior During Therapy [facility] for tasks assessed/performed      Past Medical History  Diagnosis Date  . Hep C w/o coma, chronic   . Bipolar 1 disorder   . Depression   . Illicit drug use   . Seizures   . Blindness of left eye   . Complication of anesthesia     causes manic attacks and anxiety post anesthesia  . Chronic osteomyelitis of right tibia 02/03/2014    Past Surgical History  Procedure Laterality Date  . Fracture surgery    . Tubal ligation  01/01/2001  . I&d of 4 abscesses to right arm and wrist  06/18/2003  . Orif femur fracture Right 12/18/2012    Procedure: OPEN REDUCTION INTERNAL FIXATION (ORIF) DISTAL FEMUR FRACTURE;  Surgeon: Johnny Bridge, MD;  Location: [facility];  Service: Orthopedics;  Laterality: Right;  . Amputation Right 02/03/2014    Procedure: RIGHT AMPUTATION LEG THROUGH TIBIA/FIBULA;  Surgeon: Johnny Bridge, MD;  Location: [place];  Service: Orthopedics;  Laterality: Right;    There were no vitals taken for this visit.  Visit Diagnosis:  Abnormality of gait  Status post below knee amputation of right lower extremity      Subjective Assessment - 08/22/14 1455    Symptoms No falls. Some pain today.   Currently in Pain? Yes   Pain Score 3    Pain Location Knee   Pain Orientation Right   Pain Descriptors / Indicators  Aching;Dull   Pain Type Chronic pain;Phantom pain   Pain Onset More than a month ago   Aggravating Factors  increased prosthesis wear   Pain Relieving Factors rest, removing prosthesis         [facility] Adult PT Treatment/Exercise - 08/22/14 1458    Ambulation/Gait   Ambulation/Gait Yes   Ambulation/Gait Assistance 5: Supervision   Ambulation/Gait Assistance Details used colored tile as guide to work on correct base of support and step placement to correct gait deviations.                 Ambulation Distance (Feet) 400 Feet  x1   Assistive device Prosthesis;None   Gait Pattern Step-through pattern;Decreased stance time - right;Decreased step length - left;Abducted- right   Ambulation Surface Level;Indoor   Ramp 5: Supervision  prosthesis; 2 reps   Ramp Details (indicate cue type and reason) cues on posture and sequence   Curb 5: Supervision  min guard assist with prosthesis; 2 reps   Curb Details (indicate cue type and reason) cues on posture and sequence   Knee/Hip Exercises: Aerobic   Tread Mill Gait trainer program x 3 minutes at 1.5 mph with UE support. cues on posture with gait. Results showed decreased step length on right and equal time on each foot.  Prosthetics   Prosthetic Care Comments  Using the baby oil/deodorant. See's the prosthetist on Thursday for following adjustments: prosthesis too tall, needs tibial pads added, has a slight lateral lean and a slight toe in.                           Current prosthetic wear tolerance (days/week)  7 days /wk    Current prosthetic wear tolerance (#hours/day)  all awake hours, taking off mid day for small break   Residual limb condition  intact, no heat rash present today   Education Provided Correct ply sock adjustment;Proper wear schedule/adjustment;Residual limb care   Donning Prosthesis Modified independent (device/increased time)   Doffing Prosthesis Modified independent (device/increased time)            PT Short Term Goals - 08/17/14 1400    PT SHORT TERM GOAL #1   Title demonstrates proper prosthetic care except cues to adjust ply socks. (Target Date: 08/18/14)   Time 4   Period Weeks   Status Achieved   PT SHORT TERM GOAL #2   Title tolerates wear of prosthesis >10 hours /day without change in skin integrity or pain /discomfort. (Target Date: 08/18/14)   Time 4   Period Weeks   Status Not Met   PT SHORT TERM GOAL #3   Title ambulates 500' with prosthesis only with cues. (Target Date: 08/18/14)   Time 4   Period Weeks   Status Achieved   PT SHORT TERM GOAL #4   Title negotiates ramp, curb & stairs (1 rail) with prosthesis only with cues. (Target Date: 08/18/14)   Status Not Met           PT Long Term Goals - 07/20/14 1258    PT LONG TERM GOAL #1   Title Independent with prosthetic care. (Target Date: 09/16/14)   Time 8   Period Weeks   Status New   PT LONG TERM GOAL #2   Title Tolerates wear of prosthesis >90% of awake hours without change in skin integrity or pain/discomfort. (Target Date: 09/16/14)   Time 8   Period Weeks   Status New   PT LONG TERM GOAL #3   Title Dynamic Gait Index >19/24 (Target Date: 09/16/14)   Time 8   Period Weeks   Status New   PT LONG TERM GOAL #4   Title lift 25# & carry >100' with prosthesis only independently with correct mechanics. (Target Date: 09/16/14)   Time 8   Period Weeks   Status New           Plan - 08/22/14 1457    Clinical Impression Statement Pt continues to show progress toward goals. Focues on correcting gait deviations today without issues being reported.   Pt will benefit from skilled therapeutic intervention in order to improve on the following deficits Abnormal gait;Decreased activity tolerance;Decreased mobility;Decreased balance   Rehab Potential Excellent   PT Frequency 1x / week   PT Duration 8 weeks   PT Treatment/Interventions ADLs/Self Care Home Management;Gait training;Stair training;Functional  mobility training;Therapeutic activities;Therapeutic exercise;Balance training;Neuromuscular re-education;Patient/family education;Other (comment)  prosthetic training   PT Next Visit Plan Review prosthetic care, gait on barriers   PT Home Exercise Plan Midline HEP at sink   Consulted and Agree with Plan of Care Patient        Problem List Patient Active Problem List   Diagnosis Date Noted  . Chronic osteomyelitis of right tibia 02/03/2014  .  Osteomyelitis, lower leg 02/03/2014  . Fracture of distal femur 12/18/2012    Willow Ora 08/22/2014, 3:58 PM  Willow Ora, PTA, [facility] [address] [place], [place] [address] [phone] 08/22/2014, 3:58 PM

## 2014-08-31 ENCOUNTER — Ambulatory Visit: Payer: Medicare Other | Admitting: Physical Therapy

## 2014-09-07 ENCOUNTER — Ambulatory Visit: Payer: Medicare Other | Admitting: Physical Therapy

## 2014-09-14 ENCOUNTER — Ambulatory Visit: Payer: Medicare Other | Attending: Orthopedic Surgery | Admitting: Physical Therapy

## 2014-09-14 DIAGNOSIS — Z89511 Acquired absence of right leg below knee: Secondary | ICD-10-CM | POA: Insufficient documentation

## 2014-09-14 DIAGNOSIS — R269 Unspecified abnormalities of gait and mobility: Secondary | ICD-10-CM | POA: Insufficient documentation

## 2014-09-15 ENCOUNTER — Encounter: Payer: Self-pay | Admitting: Physical Therapy

## 2014-09-15 NOTE — Therapy (Signed)
[facility] [address] [place], [place], [address] Phone: [phone]   Fax:  [phone]  Patient Details  Name: Mary Long MRN: [id number] Date of Birth: 09/09/1975 Referring Provider:  Joshua Landau, MD  Encounter Date: 09/15/2014  PHYSICAL THERAPY DISCHARGE SUMMARY  Visits from Start of Care: 4   Current functional level related to goals / functional outcomes:  Patient has no-showed multiple appointments including last 3 scheduled. So unknown status at discharge.     PT Long Term Goals - 07/20/14 1258    PT LONG TERM GOAL #1   Title Independent with prosthetic care. (Target Date: 09/16/14)   Time 8   Period Weeks   Status New   PT LONG TERM GOAL #2   Title Tolerates wear of prosthesis >90% of awake hours without change in skin integrity or pain/discomfort. (Target Date: 09/16/14)   Time 8   Period Weeks   Status New   PT LONG TERM GOAL #3   Title Dynamic Gait Index >19/24 (Target Date: 09/16/14)   Time 8   Period Weeks   Status New   PT LONG TERM GOAL #4   Title lift 25# & carry >100' with prosthesis only independently with correct mechanics. (Target Date: 09/16/14)   Time 8   Period Weeks   Status New       Remaining deficits: unknown   Education / Equipment: Patient instructed in prosthetic care.  Plan: Patient agrees to discharge.  Patient goals were not met. Patient is being discharged due to not returning since the last visit.  ?????       , PT,DPT 09/15/2014, 4:12 PM  [facility] [address] [place], [place], [address] Phone: [phone]   Fax:  [phone] 

## 2015-07-28 ENCOUNTER — Encounter (HOSPITAL_COMMUNITY): Payer: Self-pay | Admitting: Emergency Medicine

## 2015-07-28 ENCOUNTER — Emergency Department (HOSPITAL_COMMUNITY)
Admission: EM | Admit: 2015-07-28 | Discharge: 2015-07-28 | Disposition: A | Discharge status: Home / Self Care | Payer: Medicare Other | Attending: Emergency Medicine | Admitting: Emergency Medicine

## 2015-07-28 DIAGNOSIS — F319 Bipolar disorder, unspecified: Secondary | ICD-10-CM | POA: Diagnosis not present

## 2015-07-28 DIAGNOSIS — H5442 Blindness, left eye, normal vision right eye: Secondary | ICD-10-CM | POA: Diagnosis not present

## 2015-07-28 DIAGNOSIS — Z8739 Personal history of other diseases of the musculoskeletal system and connective tissue: Secondary | ICD-10-CM | POA: Insufficient documentation

## 2015-07-28 DIAGNOSIS — H109 Unspecified conjunctivitis: Secondary | ICD-10-CM | POA: Insufficient documentation

## 2015-07-28 DIAGNOSIS — Z8619 Personal history of other infectious and parasitic diseases: Secondary | ICD-10-CM | POA: Diagnosis not present

## 2015-07-28 DIAGNOSIS — F17210 Nicotine dependence, cigarettes, uncomplicated: Secondary | ICD-10-CM | POA: Insufficient documentation

## 2015-07-28 DIAGNOSIS — H578 Other specified disorders of eye and adnexa: Secondary | ICD-10-CM | POA: Diagnosis present

## 2015-07-28 DIAGNOSIS — Z79899 Other long term (current) drug therapy: Secondary | ICD-10-CM | POA: Insufficient documentation

## 2015-07-28 DIAGNOSIS — H5440 Blindness, one eye, unspecified eye: Secondary | ICD-10-CM

## 2015-07-28 MED ORDER — CIPROFLOXACIN HCL 0.3 % OP SOLN
2.0000 [drp] | Freq: Once | OPHTHALMIC | Status: AC
  Administered 2015-07-28: 2 [drp] via OPHTHALMIC
  Filled 2015-07-28: qty 2.5

## 2015-07-28 MED ORDER — TETRACAINE HCL 0.5 % OP SOLN
2.0000 [drp] | Freq: Once | OPHTHALMIC | Status: AC
  Administered 2015-07-28: 2 [drp] via OPHTHALMIC
  Filled 2015-07-28: qty 8

## 2015-07-28 MED ORDER — FLUORESCEIN SODIUM 1 MG OP STRP
2.0000 | ORAL_STRIP | Freq: Once | OPHTHALMIC | Status: AC
  Administered 2015-07-28: 2 via OPHTHALMIC
  Filled 2015-07-28: qty 2

## 2015-07-28 NOTE — ED Notes (Signed)
Pt states [place] recently traveled to [place] and began having irritation and drainage to R eye. Pt removed contact lens and irritation continued. Pt is blind in L eye. Copious amounts of green drainage noted, sclera red, pt seen clearing drainage with dirty hands and sleeve of sweater.

## 2015-07-28 NOTE — Discharge Instructions (Signed)
Follow with the eye doctor  Tomorrow.  Do not reuse your contact lenses and do not use any contact lenses until you are cleared by the eye doctor.  Apply the cipro antibiotic drops every 2 hours while you are awake for  2 days, then every 4 hours for 5 more days.    Wash your hands frequently and try to keep your hands away from the affected eye(s).   You should be feeling some improvement by 48 hours. If symptoms worsen, you develop pain, change in your vision or no improvement in 48 hours please follow with the ophthalmologist or, if that is not possible, return to the emergency room for a recheck.   Bacterial Conjunctivitis Bacterial conjunctivitis, commonly called pink eye, is an inflammation of the clear membrane that covers the white part of the eye (conjunctiva). The inflammation can also happen on the underside of the eyelids. The blood vessels in the conjunctiva become inflamed, causing the eye to become red or pink. Bacterial conjunctivitis may spread easily from one eye to another and from person to person (contagious).  CAUSES  Bacterial conjunctivitis is caused by bacteria. The bacteria may come from your own skin, your upper respiratory tract, or from someone else with bacterial conjunctivitis. SYMPTOMS  The normally white color of the eye or the underside of the eyelid is usually pink or red. The pink eye is usually associated with irritation, tearing, and some sensitivity to light. Bacterial conjunctivitis is often associated with a thick, yellowish discharge from the eye. The discharge may turn into a crust on the eyelids overnight, which causes your eyelids to stick together. If a discharge is present, there may also be some blurred vision in the affected eye. DIAGNOSIS  Bacterial conjunctivitis is diagnosed by your caregiver through an eye exam and the symptoms that you report. Your caregiver looks for changes in the surface tissues of your eyes, which may point to the  specific type of conjunctivitis. A sample of any discharge may be collected on a cotton-tip swab if you have a severe case of conjunctivitis, if your cornea is affected, or if you keep getting repeat infections that do not respond to treatment. The sample will be sent to a lab to see if the inflammation is caused by a bacterial infection and to see if the infection will respond to antibiotic medicines. TREATMENT   Bacterial conjunctivitis is treated with antibiotics. Antibiotic eyedrops are most often used. However, antibiotic ointments are also available. Antibiotics pills are sometimes used. Artificial tears or eye washes may ease discomfort. HOME CARE INSTRUCTIONS   To ease discomfort, apply a cool, clean washcloth to your eye for 10-20 minutes, 3-4 times a day.  Gently wipe away any drainage from your eye with a warm, wet washcloth or a cotton ball.  Wash your hands often with soap and water. Use paper towels to dry your hands.  Do not share towels or washcloths. This may spread the infection.  Change or wash your pillowcase every day.  You should not use eye makeup until the infection is gone.  Do not operate machinery or drive if your vision is blurred.  Stop using contact lenses. Ask your caregiver how to sterilize or replace your contacts before using them again. This depends on the type of contact lenses that you use.  When applying medicine to the infected eye, do not touch the edge of your eyelid with the eyedrop bottle or ointment tube. SEEK IMMEDIATE MEDICAL CARE IF:  Your infection has not improved within 3 days after beginning treatment.  You had yellow discharge from your eye and it returns.  You have increased eye pain.  Your eye redness is spreading.  Your vision becomes blurred.  You have a fever or persistent symptoms for more than 2-3 days.  You have a fever and your symptoms suddenly get worse.  You have facial pain, redness, or swelling. MAKE SURE YOU:     Understand these instructions.  Will watch your condition.  Will get help right away if you are not doing well or get worse.   This information is not intended to replace advice given to you by your health care provider. Make sure you discuss any questions you have with your health care provider.   Document Released: 06/30/2005 Document Revised: 07/21/2014 Document Reviewed: 12/01/2011 Elsevier Interactive Patient Education [employer]

## 2015-07-28 NOTE — ED Provider Notes (Signed)
CSN: [id number]     Arrival date & time 07/28/15  0347 History   First MD Initiated Contact with Patient 07/28/15 [phone]     Chief Complaint  Patient presents with  . Eye Drainage     (Consider location/radiation/quality/duration/timing/severity/associated sxs/prior Treatment) HPI  Blood pressure 111/70, pulse 88, temperature 98.6 F (37 C), temperature source Oral, resp. rate 20, last menstrual period 07/24/2015, SpO2 100 %.  Mary BrowLouise B Hackmann is a 40 y.o. female with past medical history significant for left eye blindness complaining of purulent discharge to bilateral eyes. Patient states she recently traveled to [place], she used her contact lenses for 2 week, she normally uses them for 1. She only wears contact lenses in the left (blind eye, she wears them for cosmetic reasons. She is uncorrected vision in the right eye. The irritation and discharge started 2 days ago. She DC the use of her contact lenses. She denies decreasing visual acuity, fever, chills, pain, foreign body sensation.   Past Medical History  Diagnosis Date  . Hep C w/o coma, chronic (HCC)   . Bipolar 1 disorder (HCC)   . Depression   . Illicit drug use   . Seizures (HCC)   . Blindness of left eye   . Complication of anesthesia     causes manic attacks and anxiety post anesthesia  . 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 Fracture surgery    . Tubal ligation  01/01/2001  . I&d of 4 abscesses to right arm and wrist  06/18/2003  . Orif femur fracture Right 12/18/2012    Procedure: OPEN REDUCTION INTERNAL FIXATION (ORIF) DISTAL FEMUR FRACTURE;  Surgeon: Eulas PostJoshua P Landau, MD;  Location: [facility] OR;  Service: Orthopedics;  Laterality: Right;  . Amputation Right 02/03/2014    Procedure: RIGHT AMPUTATION LEG THROUGH TIBIA/FIBULA;  Surgeon: Eulas PostJoshua P Landau, MD;  Location: [facility] OR;  Service: Orthopedics;  Laterality: Right;   No family history on file. Social  History  Substance Use Topics  . Smoking status: Current Every Day Smoker -- 1.00 packs/day    Types: Cigarettes  . Smokeless tobacco: Never Used     Comment: smokes electronic cigarettes  . Alcohol Use: No   OB History    No data available     Review of Systems  10 systems reviewed and found to be negative, except as noted in the HPI.  Allergies  Tylenol  Home Medications   Prior to Admission medications   Medication Sig Start Date End Date Taking? Authorizing Provider  ALPRAZolam Prudy Feeler(XANAX) 1 MG tablet Take 1 mg by mouth 3 (three) times daily.     Historical Provider, MD  amphetamine-dextroamphetamine (ADDERALL) 20 MG tablet Take 20 mg by mouth daily as needed (for when in school).     Historical Provider, MD  buPROPion (WELLBUTRIN) 100 MG tablet Take 100 mg by mouth 2 (two) times daily.     Historical Provider, MD  diazepam (VALIUM) 5 MG tablet Take 1 tablet (5 mg total) by mouth every 6 (six) hours as needed for anxiety. Patient not taking: Reported on 07/19/2014 02/03/14   Teryl LucyJoshua Landau, MD  FLUoxetine (PROZAC) 20 MG tablet Take 20 mg by mouth at bedtime. Take with olanzapine    Historical Provider, MD  HYDROmorphone (DILAUDID) 2 MG tablet Take 1 tablet (2 mg total) by mouth every 4 (four) hours as needed for severe pain. Patient not taking: Reported on 07/19/2014 02/03/14  Teryl Lucy, MD  METHADONE HCL PO Take 85 mg by mouth daily.    Historical Provider, MD  OLANZapine (ZYPREXA) 5 MG tablet Take 5 mg by mouth at bedtime. Take with fluoxetine    Historical Provider, MD  Oxycodone HCl 10 MG TABS Take 1 tablet (10 mg total) by mouth 4 (four) times daily as needed (severe pain). Patient not taking: Reported on 07/19/2014 02/03/14   Teryl Lucy, MD  PRESCRIPTION MEDICATION Apply 1 application topically 3 (three) times daily as needed (for pain). A COMPOUNDED PAIN CREAM (with three or four components in it) that gets shipped to the pt    Historical Provider, MD  sennosides-docusate  sodium (SENOKOT-S) 8.6-50 MG tablet Take 2 tablets by mouth daily. 02/03/14   Teryl Lucy, MD  zolpidem (AMBIEN) 10 MG tablet Take 10 mg by mouth at bedtime as needed for sleep. For sleep    Historical Provider, MD   BP 111/70 mmHg  Pulse 88  Temp(Src) 98.6 F (37 C) (Oral)  Resp 20  SpO2 100%  LMP 07/24/2015 Physical Exam  Constitutional: She is oriented to person, place, and time. She appears well-developed and well-nourished. No distress.  HENT:  Head: Normocephalic.  Eyes: EOM are normal. Pupils are equal, round, and reactive to light.  Normal lids and lashes, no proptosis.  Left eye is opaque , Right eye is mildly injected.Pupil round and reactive to light, extraocular movement is intact. OD 20/20, no abnormal uptake on fluorescein stain bilaterally, slit lamp exam without acute abnormality.  Cardiovascular: Normal rate.   Pulmonary/Chest: Effort normal. No stridor.  Musculoskeletal: Normal range of motion.  Neurological: She is alert and oriented to person, place, and time.  Psychiatric: She has a normal mood and affect.  Nursing note and vitals reviewed.   ED Course  Procedures (including critical care time) Labs Review Labs Reviewed - No data to display  Imaging Review No results found. I have personally reviewed and evaluated these images and lab results as part of my medical decision-making.   EKG Interpretation None      MDM   Final diagnoses:  Bilateral conjunctivitis  Blindness of left eye    Filed Vitals:   07/28/15 0404  BP: 111/70  Pulse: 88  Temp: 98.6 F (37 C)  TempSrc: Oral  Resp: 20  SpO2: 100%    Medications  tetracaine (PONTOCAINE) 0.5 % ophthalmic solution 2 drop (not administered)  fluorescein ophthalmic strip 2 strip (not administered)    Mary Long is 40 y.o. female presenting with bilateral eye discharge and irritation. Patient is blind in one eye, she is a contact lens user and kept her contact lens in the left in for  twice her normal length of time on vacation.  Patient has no abnormality on floor seen her slit-lamp exam. Vision is perfect at 20 out of 20, she stopped using her contact lenses. She will be started on Cipro drops to cover Pseudomonas. Will follow with [facility] ophthalmologist at [facility]. Extensive discussion of return precautions.   Evaluation does not show pathology that would require ongoing emergent intervention or inpatient treatment. Pt is hemodynamically stable and mentating appropriately. Discussed findings and plan with patient/guardian, who agrees with care plan. All questions answered. Return precautions discussed and outpatient follow up given.       Wynetta Emery, PA-C 07/28/15 8119  Lyndal Pulley, MD 07/28/15 [phone]

## 2017-01-05 ENCOUNTER — Other Ambulatory Visit (HOSPITAL_COMMUNITY): Payer: Self-pay | Admitting: Interventional Radiology

## 2017-01-05 DIAGNOSIS — Z419 Encounter for procedure for purposes other than remedying health state, unspecified: Secondary | ICD-10-CM

## 2017-01-06 ENCOUNTER — Encounter (HOSPITAL_COMMUNITY)
Admission: RE | Disposition: A | Discharge status: Home / Self Care | Payer: Self-pay | Source: Ambulatory Visit | Attending: Orthopedic Surgery

## 2017-01-06 ENCOUNTER — Inpatient Hospital Stay (HOSPITAL_COMMUNITY)
Admission: RE | Admit: 2017-01-06 | Discharge: 2017-01-15 | DRG: 475 | Disposition: A | Discharge status: Home / Self Care | Payer: Medicare Other | Source: Ambulatory Visit | Attending: Orthopedic Surgery | Admitting: Orthopedic Surgery

## 2017-01-06 ENCOUNTER — Other Ambulatory Visit (HOSPITAL_COMMUNITY): Payer: Self-pay | Admitting: Interventional Radiology

## 2017-01-06 ENCOUNTER — Inpatient Hospital Stay (HOSPITAL_COMMUNITY): Payer: Medicare Other | Admitting: Certified Registered"

## 2017-01-06 ENCOUNTER — Ambulatory Visit (HOSPITAL_COMMUNITY)
Admission: RE | Admit: 2017-01-06 | Discharge: 2017-01-06 | Disposition: A | Discharge status: Home / Self Care | Payer: Medicare Other | Source: Ambulatory Visit | Attending: Interventional Radiology | Admitting: Interventional Radiology

## 2017-01-06 ENCOUNTER — Encounter (HOSPITAL_COMMUNITY): Payer: Self-pay | Admitting: Interventional Radiology

## 2017-01-06 DIAGNOSIS — M00061 Staphylococcal arthritis, right knee: Secondary | ICD-10-CM | POA: Diagnosis present

## 2017-01-06 DIAGNOSIS — G8929 Other chronic pain: Secondary | ICD-10-CM | POA: Diagnosis not present

## 2017-01-06 DIAGNOSIS — Z89511 Acquired absence of right leg below knee: Secondary | ICD-10-CM

## 2017-01-06 DIAGNOSIS — F329 Major depressive disorder, single episode, unspecified: Secondary | ICD-10-CM | POA: Diagnosis present

## 2017-01-06 DIAGNOSIS — Z419 Encounter for procedure for purposes other than remedying health state, unspecified: Secondary | ICD-10-CM

## 2017-01-06 DIAGNOSIS — B182 Chronic viral hepatitis C: Secondary | ICD-10-CM | POA: Diagnosis present

## 2017-01-06 DIAGNOSIS — Z792 Long term (current) use of antibiotics: Secondary | ICD-10-CM | POA: Diagnosis not present

## 2017-01-06 DIAGNOSIS — B9562 Methicillin resistant Staphylococcus aureus infection as the cause of diseases classified elsewhere: Secondary | ICD-10-CM | POA: Diagnosis present

## 2017-01-06 DIAGNOSIS — M009 Pyogenic arthritis, unspecified: Secondary | ICD-10-CM

## 2017-01-06 DIAGNOSIS — Z8619 Personal history of other infectious and parasitic diseases: Secondary | ICD-10-CM | POA: Diagnosis not present

## 2017-01-06 DIAGNOSIS — F17210 Nicotine dependence, cigarettes, uncomplicated: Secondary | ICD-10-CM | POA: Diagnosis present

## 2017-01-06 DIAGNOSIS — M869 Osteomyelitis, unspecified: Secondary | ICD-10-CM | POA: Diagnosis present

## 2017-01-06 DIAGNOSIS — M8600 Acute hematogenous osteomyelitis, unspecified site: Secondary | ICD-10-CM

## 2017-01-06 DIAGNOSIS — Z886 Allergy status to analgesic agent status: Secondary | ICD-10-CM | POA: Diagnosis not present

## 2017-01-06 DIAGNOSIS — Z79899 Other long term (current) drug therapy: Secondary | ICD-10-CM | POA: Diagnosis not present

## 2017-01-06 DIAGNOSIS — Z8739 Personal history of other diseases of the musculoskeletal system and connective tissue: Secondary | ICD-10-CM | POA: Diagnosis not present

## 2017-01-06 DIAGNOSIS — M86151 Other acute osteomyelitis, right femur: Secondary | ICD-10-CM | POA: Diagnosis not present

## 2017-01-06 DIAGNOSIS — F319 Bipolar disorder, unspecified: Secondary | ICD-10-CM | POA: Diagnosis present

## 2017-01-06 DIAGNOSIS — A4902 Methicillin resistant Staphylococcus aureus infection, unspecified site: Secondary | ICD-10-CM

## 2017-01-06 DIAGNOSIS — M86251 Subacute osteomyelitis, right femur: Secondary | ICD-10-CM | POA: Diagnosis not present

## 2017-01-06 DIAGNOSIS — G40909 Epilepsy, unspecified, not intractable, without status epilepticus: Secondary | ICD-10-CM | POA: Diagnosis not present

## 2017-01-06 HISTORY — PX: IR FLUORO GUIDE CV LINE RIGHT: IMG2283

## 2017-01-06 HISTORY — DX: Osteomyelitis, unspecified: M86.9

## 2017-01-06 HISTORY — PX: IR US GUIDE VASC ACCESS RIGHT: IMG2390

## 2017-01-06 HISTORY — PX: IRRIGATION AND DEBRIDEMENT KNEE: SHX5185

## 2017-01-06 HISTORY — PX: HARDWARE REMOVAL: SHX979

## 2017-01-06 HISTORY — PX: APPLICATION OF WOUND VAC: SHX5189

## 2017-01-06 LAB — CBC
HEMATOCRIT: 29.5 % — AB (ref 36.0–46.0)
HEMATOCRIT: 28.7 % — AB (ref 36.0–46.0)
Hemoglobin: 8.8 g/dL — ABNORMAL LOW (ref 12.0–15.0)
Hemoglobin: 8.6 g/dL — ABNORMAL LOW (ref 12.0–15.0)
MCH: 21.9 pg — AB (ref 26.0–34.0)
MCH: 22.1 pg — ABNORMAL LOW (ref 26.0–34.0)
MCHC: 29.8 g/dL — ABNORMAL LOW (ref 30.0–36.0)
MCHC: 30 g/dL (ref 30.0–36.0)
MCV: 73.4 fL — AB (ref 78.0–100.0)
MCV: 73.6 fL — ABNORMAL LOW (ref 78.0–100.0)
PLATELETS: 488 10*3/uL — AB (ref 150–400)
Platelets: 516 10*3/uL — ABNORMAL HIGH (ref 150–400)
RBC: 4.02 MIL/uL (ref 3.87–5.11)
RBC: 3.9 MIL/uL (ref 3.87–5.11)
RDW: 16.8 % — ABNORMAL HIGH (ref 11.5–15.5)
RDW: 17.1 % — AB (ref 11.5–15.5)
WBC: 6.1 10*3/uL (ref 4.0–10.5)
WBC: 9.1 10*3/uL (ref 4.0–10.5)

## 2017-01-06 LAB — COMPREHENSIVE METABOLIC PANEL
ALK PHOS: 64 U/L (ref 38–126)
ALT: 18 U/L (ref 14–54)
AST: 25 U/L (ref 15–41)
Albumin: 2.7 g/dL — ABNORMAL LOW (ref 3.5–5.0)
Anion gap: 5 (ref 5–15)
BILIRUBIN TOTAL: 0.3 mg/dL (ref 0.3–1.2)
BUN: 7 mg/dL (ref 6–20)
CO2: 29 mmol/L (ref 22–32)
Calcium: 8.6 mg/dL — ABNORMAL LOW (ref 8.9–10.3)
Chloride: 103 mmol/L (ref 101–111)
Creatinine, Ser: 0.47 mg/dL (ref 0.44–1.00)
Glucose, Bld: 93 mg/dL (ref 65–99)
Potassium: 3.8 mmol/L (ref 3.5–5.1)
Sodium: 137 mmol/L (ref 135–145)
Total Protein: 6.8 g/dL (ref 6.5–8.1)

## 2017-01-06 LAB — SEDIMENTATION RATE: Sed Rate: 74 mm/hr — ABNORMAL HIGH (ref 0–22)

## 2017-01-06 LAB — HCG, SERUM, QUALITATIVE: Preg, Serum: NEGATIVE

## 2017-01-06 LAB — C-REACTIVE PROTEIN: CRP: 7 mg/dL — ABNORMAL HIGH (ref <1.0)

## 2017-01-06 SURGERY — IRRIGATION AND DEBRIDEMENT KNEE
Anesthesia: General | Site: Knee | Laterality: Right

## 2017-01-06 MED ORDER — ONDANSETRON HCL 4 MG/2ML IJ SOLN
INTRAMUSCULAR | Status: AC
  Filled 2017-01-06: qty 2

## 2017-01-06 MED ORDER — LIDOCAINE 2% (20 MG/ML) 5 ML SYRINGE
INTRAMUSCULAR | Status: AC
  Filled 2017-01-06: qty 5

## 2017-01-06 MED ORDER — MEPERIDINE HCL 25 MG/ML IJ SOLN: 6.2500 mg | INTRAMUSCULAR | Status: DC | PRN

## 2017-01-06 MED ORDER — SODIUM CHLORIDE 0.9% FLUSH
10.0000 mL | Freq: Two times a day (BID) | INTRAVENOUS | Status: DC
  Administered 2017-01-09 – 2017-01-11 (×4): 10 mL

## 2017-01-06 MED ORDER — FENTANYL CITRATE (PF) 250 MCG/5ML IJ SOLN
INTRAMUSCULAR | Status: AC
  Filled 2017-01-06: qty 5

## 2017-01-06 MED ORDER — NALOXONE HCL 0.4 MG/ML IJ SOLN: 0.4000 mg | INTRAMUSCULAR | Status: DC | PRN

## 2017-01-06 MED ORDER — POLYETHYLENE GLYCOL 3350 17 G PO PACK: 17.0000 g | PACK | Freq: Every day | ORAL | Status: DC | PRN

## 2017-01-06 MED ORDER — KETOROLAC TROMETHAMINE 15 MG/ML IJ SOLN
INTRAMUSCULAR | Status: AC
  Filled 2017-01-06: qty 1

## 2017-01-06 MED ORDER — FENTANYL CITRATE (PF) 100 MCG/2ML IJ SOLN
INTRAMUSCULAR | Status: DC | PRN
Start: 2017-01-06 — End: 2017-01-06
  Administered 2017-01-06 (×2): 150 ug via INTRAVENOUS
  Administered 2017-01-06: 100 ug via INTRAVENOUS
  Administered 2017-01-06 (×3): 50 ug via INTRAVENOUS
  Administered 2017-01-06: 150 ug via INTRAVENOUS
  Administered 2017-01-06 (×2): 50 ug via INTRAVENOUS
  Administered 2017-01-06: 100 ug via INTRAVENOUS

## 2017-01-06 MED ORDER — HYDROMORPHONE HCL 1 MG/ML IJ SOLN
INTRAMUSCULAR | Status: AC
  Administered 2017-01-06: 0.5 mg via INTRAVENOUS
  Filled 2017-01-06: qty 0.5

## 2017-01-06 MED ORDER — ENOXAPARIN SODIUM 40 MG/0.4ML ~~LOC~~ SOLN
40.0000 mg | SUBCUTANEOUS | Status: DC
  Administered 2017-01-07 – 2017-01-15 (×8): 40 mg via SUBCUTANEOUS
  Filled 2017-01-06 (×9): qty 0.4

## 2017-01-06 MED ORDER — METHOCARBAMOL 1000 MG/10ML IJ SOLN
500.0000 mg | Freq: Four times a day (QID) | INTRAVENOUS | Status: DC | PRN
  Filled 2017-01-06: qty 5

## 2017-01-06 MED ORDER — SODIUM CHLORIDE 0.9% FLUSH: 9.0000 mL | INTRAVENOUS | Status: DC | PRN

## 2017-01-06 MED ORDER — OXYCODONE HCL 5 MG PO TABS
5.0000 mg | ORAL_TABLET | ORAL | Status: DC | PRN
  Administered 2017-01-06: 10 mg via ORAL
  Filled 2017-01-06: qty 2

## 2017-01-06 MED ORDER — BISACODYL 10 MG RE SUPP: 10.0000 mg | Freq: Every day | RECTAL | Status: DC | PRN

## 2017-01-06 MED ORDER — ONDANSETRON HCL 4 MG/2ML IJ SOLN
INTRAMUSCULAR | Status: DC | PRN
  Administered 2017-01-06: 4 mg via INTRAVENOUS

## 2017-01-06 MED ORDER — ZOLPIDEM TARTRATE 5 MG PO TABS: 10.0000 mg | ORAL_TABLET | Freq: Every evening | ORAL | Status: DC | PRN

## 2017-01-06 MED ORDER — KETOROLAC TROMETHAMINE 15 MG/ML IJ SOLN
15.0000 mg | Freq: Four times a day (QID) | INTRAMUSCULAR | Status: AC
  Administered 2017-01-06 – 2017-01-07 (×4): 15 mg via INTRAVENOUS
  Filled 2017-01-06 (×4): qty 1

## 2017-01-06 MED ORDER — MIDAZOLAM HCL 2 MG/2ML IJ SOLN
INTRAMUSCULAR | Status: AC
Start: 2017-01-06 — End: 2017-01-06
  Filled 2017-01-06: qty 2

## 2017-01-06 MED ORDER — DIPHENHYDRAMINE HCL 50 MG/ML IJ SOLN
25.0000 mg | INTRAMUSCULAR | Status: DC | PRN
  Filled 2017-01-06: qty 0.5

## 2017-01-06 MED ORDER — ONDANSETRON HCL 4 MG/2ML IJ SOLN: 4.0000 mg | Freq: Four times a day (QID) | INTRAMUSCULAR | Status: DC | PRN

## 2017-01-06 MED ORDER — HYDROMORPHONE HCL 1 MG/ML IJ SOLN
0.2500 mg | INTRAMUSCULAR | Status: DC | PRN
Start: 2017-01-06 — End: 2017-01-06
  Administered 2017-01-06 (×4): 0.5 mg via INTRAVENOUS

## 2017-01-06 MED ORDER — KETAMINE HCL 10 MG/ML IJ SOLN
INTRAMUSCULAR | Status: DC | PRN
  Administered 2017-01-06: 20 mg via INTRAVENOUS
  Administered 2017-01-06: 10 mg via INTRAVENOUS
  Administered 2017-01-06: 30 mg via INTRAVENOUS
  Administered 2017-01-06: 20 mg via INTRAVENOUS

## 2017-01-06 MED ORDER — PROMETHAZINE HCL 25 MG/ML IJ SOLN
6.2500 mg | INTRAMUSCULAR | Status: DC | PRN
  Administered 2017-01-06: 12.5 mg via INTRAVENOUS

## 2017-01-06 MED ORDER — ONDANSETRON HCL 4 MG PO TABS: 4.0000 mg | ORAL_TABLET | Freq: Four times a day (QID) | ORAL | Status: DC | PRN

## 2017-01-06 MED ORDER — SODIUM CHLORIDE 0.9% FLUSH: 10.0000 mL | INTRAVENOUS | Status: DC | PRN

## 2017-01-06 MED ORDER — METOCLOPRAMIDE HCL 5 MG PO TABS: 5.0000 mg | ORAL_TABLET | Freq: Three times a day (TID) | ORAL | Status: DC | PRN

## 2017-01-06 MED ORDER — MAGNESIUM CITRATE PO SOLN: 1.0000 | Freq: Once | ORAL | Status: DC | PRN

## 2017-01-06 MED ORDER — METHOCARBAMOL 500 MG PO TABS
ORAL_TABLET | ORAL | Status: AC
  Filled 2017-01-06: qty 1

## 2017-01-06 MED ORDER — ALPRAZOLAM 0.5 MG PO TABS
1.0000 mg | ORAL_TABLET | Freq: Two times a day (BID) | ORAL | Status: DC | PRN
  Administered 2017-01-06 – 2017-01-13 (×4): 1 mg via ORAL
  Filled 2017-01-06 (×5): qty 2

## 2017-01-06 MED ORDER — DIPHENHYDRAMINE HCL 25 MG PO CAPS: 25.0000 mg | ORAL_CAPSULE | ORAL | Status: DC | PRN

## 2017-01-06 MED ORDER — METHADONE HCL 10 MG PO TABS: 80.0000 mg | ORAL_TABLET | Freq: Every day | ORAL | Status: DC

## 2017-01-06 MED ORDER — METOCLOPRAMIDE HCL 5 MG/ML IJ SOLN: 5.0000 mg | Freq: Three times a day (TID) | INTRAMUSCULAR | Status: DC | PRN

## 2017-01-06 MED ORDER — PROMETHAZINE HCL 25 MG/ML IJ SOLN
INTRAMUSCULAR | Status: AC
  Filled 2017-01-06: qty 1

## 2017-01-06 MED ORDER — ACETAMINOPHEN 325 MG PO TABS: 650.0000 mg | ORAL_TABLET | Freq: Four times a day (QID) | ORAL | Status: DC | PRN

## 2017-01-06 MED ORDER — HYDROMORPHONE 1 MG/ML IV SOLN
INTRAVENOUS | Status: DC
  Administered 2017-01-06: 19:00:00 via INTRAVENOUS
  Administered 2017-01-07: 2.4 mg via INTRAVENOUS
  Administered 2017-01-07: 1.3 mg via INTRAVENOUS
  Administered 2017-01-07: 2 mg via INTRAVENOUS
  Administered 2017-01-07: 1.8 mg via INTRAVENOUS
  Administered 2017-01-07: 1 mg via INTRAVENOUS
  Administered 2017-01-07: 0.4 mg via INTRAVENOUS
  Administered 2017-01-08: 1.4 mL via INTRAVENOUS
  Administered 2017-01-08: 0.4 mg via INTRAVENOUS
  Administered 2017-01-08: 1.6 mg via INTRAVENOUS
  Administered 2017-01-08: 2 mg via INTRAVENOUS
  Administered 2017-01-08 (×2): 0.4 mg via INTRAVENOUS
  Administered 2017-01-09: 2 mg via INTRAVENOUS
  Administered 2017-01-09: 3 mg via INTRAVENOUS
  Administered 2017-01-09: 1.2 mg via INTRAVENOUS
  Administered 2017-01-09: 0.8 mg via INTRAVENOUS
  Administered 2017-01-09: 0.4 mg via INTRAVENOUS
  Administered 2017-01-10: 1 mg via INTRAVENOUS
  Administered 2017-01-10: 1.6 mg via INTRAVENOUS
  Administered 2017-01-10: 0.4 mg via INTRAVENOUS
  Administered 2017-01-10: 02:00:00 via INTRAVENOUS
  Administered 2017-01-11: 0.8 mg via INTRAVENOUS
  Administered 2017-01-11: 0.4 mg via INTRAVENOUS
  Administered 2017-01-11: 1.2 mg via INTRAVENOUS
  Administered 2017-01-12: 20:00:00 via INTRAVENOUS
  Administered 2017-01-12: 1 mg via INTRAVENOUS
  Administered 2017-01-12: 0 mg via INTRAVENOUS
  Administered 2017-01-12: 0.8 mg via INTRAVENOUS
  Administered 2017-01-12: 1.2 mg via INTRAVENOUS
  Filled 2017-01-06 (×2): qty 25

## 2017-01-06 MED ORDER — DOCUSATE SODIUM 100 MG PO CAPS
100.0000 mg | ORAL_CAPSULE | Freq: Two times a day (BID) | ORAL | Status: DC
  Administered 2017-01-06 – 2017-01-12 (×6): 100 mg via ORAL
  Filled 2017-01-06 (×11): qty 1

## 2017-01-06 MED ORDER — PHENYLEPHRINE HCL 10 MG/ML IJ SOLN
INTRAMUSCULAR | Status: DC | PRN
  Administered 2017-01-06: 200 ug via INTRAVENOUS

## 2017-01-06 MED ORDER — DEXMEDETOMIDINE HCL IN NACL 200 MCG/50ML IV SOLN
INTRAVENOUS | Status: DC | PRN
  Administered 2017-01-06 (×2): 8 ug via INTRAVENOUS

## 2017-01-06 MED ORDER — ACETAMINOPHEN 650 MG RE SUPP: 650.0000 mg | Freq: Four times a day (QID) | RECTAL | Status: DC | PRN

## 2017-01-06 MED ORDER — VANCOMYCIN HCL IN DEXTROSE 1-5 GM/200ML-% IV SOLN
INTRAVENOUS | Status: AC
  Filled 2017-01-06: qty 200

## 2017-01-06 MED ORDER — PROPOFOL 10 MG/ML IV BOLUS
INTRAVENOUS | Status: DC | PRN
  Administered 2017-01-06: 100 mg via INTRAVENOUS
  Administered 2017-01-06: 30 mg via INTRAVENOUS
  Administered 2017-01-06: 150 mg via INTRAVENOUS

## 2017-01-06 MED ORDER — 0.9 % SODIUM CHLORIDE (POUR BTL) OPTIME
TOPICAL | Status: DC | PRN
  Administered 2017-01-06: 1000 mL

## 2017-01-06 MED ORDER — PROPOFOL 10 MG/ML IV BOLUS
INTRAVENOUS | Status: AC
  Filled 2017-01-06: qty 20

## 2017-01-06 MED ORDER — AMPHETAMINE-DEXTROAMPHETAMINE 10 MG PO TABS: 20.0000 mg | ORAL_TABLET | Freq: Two times a day (BID) | ORAL | Status: DC | PRN

## 2017-01-06 MED ORDER — VANCOMYCIN HCL 500 MG IV SOLR
500.0000 mg | Freq: Two times a day (BID) | INTRAVENOUS | Status: DC
  Administered 2017-01-07 – 2017-01-09 (×6): 500 mg via INTRAVENOUS
  Filled 2017-01-06 (×6): qty 500

## 2017-01-06 MED ORDER — LIDOCAINE HCL (PF) 1 % IJ SOLN
INTRAMUSCULAR | Status: DC | PRN
  Administered 2017-01-06: 10 mL

## 2017-01-06 MED ORDER — LACTATED RINGERS IV SOLN
INTRAVENOUS | Status: DC
  Administered 2017-01-06 (×2): via INTRAVENOUS

## 2017-01-06 MED ORDER — METHOCARBAMOL 500 MG PO TABS
500.0000 mg | ORAL_TABLET | Freq: Four times a day (QID) | ORAL | Status: DC | PRN
  Administered 2017-01-06: 500 mg via ORAL
  Filled 2017-01-06 (×2): qty 1

## 2017-01-06 MED ORDER — LIDOCAINE HCL (PF) 1 % IJ SOLN
INTRAMUSCULAR | Status: AC
  Filled 2017-01-06: qty 30

## 2017-01-06 MED ORDER — KETAMINE HCL-SODIUM CHLORIDE 100-0.9 MG/10ML-% IV SOSY
PREFILLED_SYRINGE | INTRAVENOUS | Status: AC
  Filled 2017-01-06: qty 10

## 2017-01-06 MED ORDER — MIDAZOLAM HCL 5 MG/5ML IJ SOLN
INTRAMUSCULAR | Status: DC | PRN
  Administered 2017-01-06: 2 mg via INTRAVENOUS

## 2017-01-06 MED ORDER — HYDROMORPHONE HCL 1 MG/ML IJ SOLN
INTRAMUSCULAR | Status: AC
  Administered 2017-01-06: 0.5 mg via INTRAVENOUS
  Filled 2017-01-06: qty 1

## 2017-01-06 MED ORDER — DIPHENHYDRAMINE HCL 12.5 MG/5ML PO ELIX
12.5000 mg | ORAL_SOLUTION | ORAL | Status: DC | PRN
Start: 2017-01-06 — End: 2017-01-12

## 2017-01-06 MED ORDER — EPHEDRINE SULFATE 50 MG/ML IJ SOLN
INTRAMUSCULAR | Status: DC | PRN
  Administered 2017-01-06: 10 mg via INTRAVENOUS

## 2017-01-06 MED ORDER — POTASSIUM CHLORIDE IN NACL 20-0.45 MEQ/L-% IV SOLN
INTRAVENOUS | Status: DC
  Administered 2017-01-06 – 2017-01-12 (×8): via INTRAVENOUS
  Administered 2017-01-12: 75 mL/h via INTRAVENOUS
  Filled 2017-01-06 (×16): qty 1000

## 2017-01-06 MED ORDER — SENNA 8.6 MG PO TABS
1.0000 | ORAL_TABLET | Freq: Two times a day (BID) | ORAL | Status: DC
  Administered 2017-01-06 – 2017-01-15 (×10): 8.6 mg via ORAL
  Filled 2017-01-06 (×17): qty 1

## 2017-01-06 MED ORDER — LACTATED RINGERS IV SOLN: INTRAVENOUS | Status: DC

## 2017-01-06 MED ORDER — OXYCODONE HCL 5 MG PO TABS
ORAL_TABLET | ORAL | Status: AC
  Filled 2017-01-06: qty 2

## 2017-01-06 SURGICAL SUPPLY — 57 items
BAG DECANTER FOR FLEXI CONT (MISCELLANEOUS) IMPLANT
BANDAGE ACE 6X5 VEL STRL LF (GAUZE/BANDAGES/DRESSINGS) ×2 IMPLANT
BANDAGE ELASTIC 4 VELCRO ST LF (GAUZE/BANDAGES/DRESSINGS) ×2 IMPLANT
BANDAGE ESMARK 6X9 LF (GAUZE/BANDAGES/DRESSINGS) IMPLANT
BLADE SURG 15 STRL LF DISP TIS (BLADE) ×1 IMPLANT
BLADE SURG 15 STRL SS (BLADE) ×3
BNDG CMPR 9X6 STRL LF SNTH (GAUZE/BANDAGES/DRESSINGS)
BNDG ESMARK 6X9 LF (GAUZE/BANDAGES/DRESSINGS)
BNDG GAUZE ELAST 4 BULKY (GAUZE/BANDAGES/DRESSINGS) ×2 IMPLANT
CANISTER SUCTION 1200CC (MISCELLANEOUS) ×3 IMPLANT
CUFF TOURNIQUET SINGLE 18IN (TOURNIQUET CUFF) IMPLANT
CUFF TOURNIQUET SINGLE 34IN LL (TOURNIQUET CUFF) IMPLANT
DRAPE INCISE IOBAN 66X45 STRL (DRAPES) ×6 IMPLANT
DRAPE ORTHO SPLIT 77X108 STRL (DRAPES) ×3
DRAPE SURG ORHT 6 SPLT 77X108 (DRAPES) ×1 IMPLANT
DRAPE U-SHAPE 47X51 STRL (DRAPES) ×3 IMPLANT
DRSG VAC ATS MED SENSATRAC (GAUZE/BANDAGES/DRESSINGS) ×2 IMPLANT
DURAPREP 26ML APPLICATOR (WOUND CARE) ×3 IMPLANT
ELECT REM PT RETURN 9FT ADLT (ELECTROSURGICAL) ×3
ELECTRODE REM PT RTRN 9FT ADLT (ELECTROSURGICAL) ×1 IMPLANT
GAUZE SPONGE 4X4 12PLY STRL (GAUZE/BANDAGES/DRESSINGS) IMPLANT
GAUZE SPONGE 4X4 12PLY STRL LF (GAUZE/BANDAGES/DRESSINGS) ×2 IMPLANT
GAUZE XEROFORM 1X8 LF (GAUZE/BANDAGES/DRESSINGS) ×2 IMPLANT
GLOVE BIOGEL PI ORTHO PRO SZ8 (GLOVE) ×4
GLOVE ORTHO TXT STRL SZ7.5 (GLOVE) ×3 IMPLANT
GLOVE PI ORTHO PRO STRL SZ8 (GLOVE) ×2 IMPLANT
GLOVE SURG ORTHO 8.0 STRL STRW (GLOVE) ×3 IMPLANT
GOWN STRL REUS W/ TWL XL LVL3 (GOWN DISPOSABLE) ×1 IMPLANT
GOWN STRL REUS W/TWL 2XL LVL3 (GOWN DISPOSABLE) ×3 IMPLANT
GOWN STRL REUS W/TWL XL LVL3 (GOWN DISPOSABLE) ×3
IMMOBILIZER KNEE 24 THIGH 36 (MISCELLANEOUS) ×1 IMPLANT
IMMOBILIZER KNEE 24 UNIV (MISCELLANEOUS) ×3
KIT BASIN OR (CUSTOM PROCEDURE TRAY) ×3 IMPLANT
KIT PREVENA INCISION MGT20CM45 (CANNISTER) ×2 IMPLANT
NDL SUT 6 .5 CRC .975X.05 MAYO (NEEDLE) IMPLANT
NEEDLE MAYO TAPER (NEEDLE)
NS IRRIG 1000ML POUR BTL (IV SOLUTION) ×3 IMPLANT
PACK GENERAL/GYN (CUSTOM PROCEDURE TRAY) ×3 IMPLANT
PAD CAST 4YDX4 CTTN HI CHSV (CAST SUPPLIES) IMPLANT
PADDING CAST COTTON 4X4 STRL (CAST SUPPLIES) ×3
PADDING CAST COTTON 6X4 STRL (CAST SUPPLIES) IMPLANT
SHEET MEDIUM DRAPE 40X70 STRL (DRAPES) ×3 IMPLANT
SPONGE LAP 4X18 X RAY DECT (DISPOSABLE) ×3 IMPLANT
STAPLER VISISTAT (STAPLE) IMPLANT
STAPLER VISISTAT 35W (STAPLE) IMPLANT
SUCTION FRAZIER HANDLE 10FR (MISCELLANEOUS) ×2
SUCTION TUBE FRAZIER 10FR DISP (MISCELLANEOUS) ×1 IMPLANT
SUT VIC AB 0 CT1 27 (SUTURE)
SUT VIC AB 0 CT1 27XBRD ANBCTR (SUTURE) IMPLANT
SUT VIC AB 0 SH 27 (SUTURE) IMPLANT
SUT VIC AB 2-0 SH 27 (SUTURE)
SUT VIC AB 2-0 SH 27XBRD (SUTURE) IMPLANT
SUT VICRYL 3-0 CR8 SH (SUTURE) ×3 IMPLANT
SYR BULB 3OZ (MISCELLANEOUS) ×3 IMPLANT
TOWEL OR 17X24 6PK STRL BLUE (TOWEL DISPOSABLE) ×3 IMPLANT
UNDERPAD 30X30 (UNDERPADS AND DIAPERS) ×3 IMPLANT
WATER STERILE IRR 1000ML POUR (IV SOLUTION) ×3 IMPLANT

## 2017-01-06 NOTE — Transfer of Care (Signed)
Immediate Anesthesia Transfer of Care Note  Patient: Mary Long  Procedure(s) Performed: Procedure(s): IRRIGATION AND DEBRIDEMENT RIGHT KNEE AND IRRIGATION AND DEBRIDEMENT BONE (Right) HARDWARE REMOVAL RIGHT KNEE (Right) APPLICATION OF WOUND VAC (Right)  Patient Location: PACU  Anesthesia Type:General  Level of Consciousness: awake and patient cooperative  Airway & Oxygen Therapy: Patient Spontanous Breathing  Post-op Assessment: Report given to RN and Post -op Vital signs reviewed and stable  Post vital signs: Reviewed and stable  Last Vitals:  Vitals:   01/06/17 1135 01/06/17 1834  BP: (!) 97/58   Pulse: 85   Resp: 18   Temp: 37.1 C (P) 36.9 C    Last Pain:  Vitals:   01/06/17 1834  TempSrc:   PainSc: (P) Asleep      Patients Stated Pain Goal: 3 (01/06/17 1152)  Complications: No apparent anesthesia complications

## 2017-01-06 NOTE — Progress Notes (Signed)
Pharmacy Antibiotic Note  Mary Long is a 41 y.o. female admitted on 01/06/2017 with ostemyelitis.  Pharmacy has been consulted for vancomycin dosing.  Abscess over distal medial femur was aspirated in orthopedic office and grew staph aureus. She is now s/p removal of thigh hardware and I&D. She was on Keflex and doxycycline PTA- 10 day course that was started on 6/22. She received vancomycin 1g IV x1 pre-op today at 1650.  Plan:. Vancomycin 500mg  IV q12h starting 6/27 at 0400 (12h after pre-op dose) Follow c/s, clinical progression, surgical plans, renal function, level PRN   Height: 5\' 1"  (154.9 cm) Weight: 115 lb (52.2 kg) IBW/kg (Calculated) : 47.8  Temp (24hrs), Avg:98.6 F (37 C), Min:98.5 F (36.9 C), Max:98.7 F (37.1 C)   Recent Labs Lab 01/06/17 1145  WBC 6.1  CREATININE 0.47    Estimated Creatinine Clearance: 69.1 mL/min (by C-G formula based on SCr of 0.47 mg/dL).    Allergies  Allergen Reactions  . Tylenol [Acetaminophen] Nausea And Vomiting    Dizziness & LIVER DISEASE    Antimicrobials this admission: vancomycin 6/26 >>   Dose adjustments this admission: N/a  Microbiology results: 6/26 tissue: 6/26 R knee synovial fluid 6/26 MRSA PCR:   Thank you for allowing pharmacy to be a part of this patient's care.  Sulay Brymer D. Samuella Rasool, PharmD, BCPS Clinical Pharmacist [phone] 01/06/2017 9:05 PM

## 2017-01-06 NOTE — Anesthesia Preprocedure Evaluation (Addendum)
Anesthesia Evaluation  Patient identified by MRN, date of birth, ID band Patient awake    Reviewed: Allergy & Precautions, NPO status , Patient's Chart, lab work & pertinent test results  Airway Mallampati: II  TM Distance: >3 FB Neck ROM: Full    Dental  (+) Poor Dentition, Dental Advisory Given   Pulmonary Current Smoker,    breath sounds clear to auscultation- rhonchi       Cardiovascular negative cardio ROS   Rhythm:Regular Rate:Normal     Neuro/Psych Seizures -,  PSYCHIATRIC DISORDERS Depression Bipolar Disorder    GI/Hepatic negative GI ROS, (+) Hepatitis -, C  Endo/Other  negative endocrine ROS  Renal/GU negative Renal ROS  negative genitourinary   Musculoskeletal negative musculoskeletal ROS (+)   Abdominal Normal abdominal exam  (+)   Peds negative pediatric ROS (+)  Hematology negative hematology ROS (+)   Anesthesia Other Findings   Reproductive/Obstetrics negative OB ROS                           Anesthesia Physical Anesthesia Plan  ASA: III  Anesthesia Plan: General   Post-op Pain Management:    Induction: Intravenous  PONV Risk Score and Plan: 3 and Ondansetron, Dexamethasone, Propofol and Midazolam  Airway Management Planned: LMA  Additional Equipment:   Intra-op Plan:   Post-operative Plan: Extubation in OR  Informed Consent: I have reviewed the patients History and Physical, chart, labs and discussed the procedure including the risks, benefits and alternatives for the proposed anesthesia with the patient or authorized representative who has indicated his/her understanding and acceptance.   Dental advisory given  Plan Discussed with: CRNA  Anesthesia Plan Comments:         Anesthesia Quick Evaluation

## 2017-01-06 NOTE — Anesthesia Procedure Notes (Signed)
Procedure Name: LMA Insertion Date/Time: 01/06/2017 4:42 PM Performed by: Rosiland OzMEYERS, Trenae Brunke Pre-anesthesia Checklist: Emergency Drugs available, Patient identified, Suction available, Patient being monitored and Timeout performed Patient Re-evaluated:Patient Re-evaluated prior to inductionOxygen Delivery Method: Circle system utilized Preoxygenation: Pre-oxygenation with 100% oxygen Intubation Type: IV induction LMA: LMA inserted LMA Size: 4.0 Number of attempts: 1 Placement Confirmation: positive ETCO2 and breath sounds checked- equal and bilateral Tube secured with: Tape Dental Injury: Teeth and Oropharynx as per pre-operative assessment

## 2017-01-06 NOTE — Procedures (Signed)
InfectED knee joint  Poor peripheral access  S/p RUE DL POWER PICC  Tip svcra No comp Stable EBL 0 READY for use Full report in PACS

## 2017-01-06 NOTE — H&P (Signed)
PREOPERATIVE H&P  Chief Complaint: infected stump  HPI: Mary Long is a 41 y.o. female who has a complicated history of osteomyelitis in the right lower extremity and has had multiple traumas and operations. She had a chronic draining right lower extremity wound after a distal tibia ORIF, and ultimately underwent below-knee amputation. She is also had a previous right distal femur fracture, that underwent open reduction internal fixation by myself back in 2014. She healed those wounds well, and has done well since her below-knee amputation, which was in 2015, but recently presented with what looked like improper fitting of her prosthesis, we ordered a new prosthesis, and then unfortunately she came back to the office with significant soft tissue swelling around the medial side. She has plates on both the medial and lateral aspect of her distal femur. Aspiration from the office of the fluid collection medially demonstrated staph aureus. She was not really having much in the way of knee pain.  We have recommended surgical intervention in order to address her soft tissue abscess, and the possibility that she may have infection within her knee as well as even the possibility of osteomyelitis.  Her abscess developed over the course of the past couple of days, maybe a week ago.  Past Medical History:  Diagnosis Date  . Bipolar 1 disorder (HCC)   . Blindness of left eye   . 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 Complication of anesthesia    causes manic attacks and anxiety post anesthesia  . Depression   . Hep C w/o coma, chronic (HCC)   . Illicit drug use   . Seizures (HCC)    Past Surgical History:  Procedure Laterality Date  . AMPUTATION Right 02/03/2014   Procedure: RIGHT AMPUTATION LEG THROUGH TIBIA/FIBULA;  Surgeon: Eulas PostJoshua P Anani Gu, MD;  Location: [facility] OR;  Service: Orthopedics;  Laterality: Right;  . FRACTURE SURGERY    . I&D of 4 abscesses to right arm and wrist   06/18/2003  . IR FLUORO GUIDE CV LINE RIGHT  01/06/2017  . IR US GUIDE VASC ACCESS RIGHT  01/06/2017  . ORIF FEMUR FRACTURE Right 12/18/2012   Procedure: OPEN REDUCTION INTERNAL FIXATION (ORIF) DISTAL FEMUR FRACTURE;  Surgeon: Eulas PostJoshua P Sallyanne Birkhead, MD;  Location: [facility] OR;  Service: Orthopedics;  Laterality: Right;  . TUBAL LIGATION  01/01/2001   Social History   Social History  . Marital status: Widowed    Spouse name: N/A  . Number of children: N/A  . Years of education: N/A   Social History Main Topics  . Smoking status: Current Every Day Smoker    Packs/day: 1.00    Types: Cigarettes  . Smokeless tobacco: Never Used     Comment: smokes electronic cigarettes  . Alcohol use No  . Drug use: Yes    Types: Cocaine, Heroin     Comment: History of heroin and cocaine use; none for "years"  . Sexual activity: Yes   Other Topics Concern  . None   Social History Narrative  . None   History reviewed. No pertinent family history. Allergies  Allergen Reactions  . Tylenol [Acetaminophen] Nausea And Vomiting    Dizziness & LIVER DISEASE   Prior to Admission medications   Medication Sig Start Date End Date Taking? Authorizing Provider  ALPRAZolam Prudy Feeler(XANAX) 1 MG tablet Take 1 mg by mouth 2 (two) times daily as needed for anxiety.    Yes [provider]  amphetamine-dextroamphetamine (ADDERALL) 20 MG tablet Take 20  mg by mouth 2 (two) times daily as needed (for focus at work).    Yes [provider]  cephALEXin (KEFLEX) 500 MG capsule Take 500 mg by mouth 4 (four) times daily.   Yes [provider]  doxycycline (MONODOX) 100 MG capsule Take 100 mg by mouth 2 (two) times daily. 01/02/17  Yes [provider]  METHADONE HCL PO Take 75 mg by mouth daily.    Yes [provider]  zolpidem (AMBIEN) 10 MG tablet Take 10 mg by mouth at bedtime as needed for sleep. For sleep   Yes [provider]     Positive ROS: All other systems have been reviewed and  were otherwise negative with the exception of those mentioned in the HPI and as above.  Physical Exam: General: Alert, no acute distress, She is somewhat cachectic, at baseline, and is somewhat frail. Cardiovascular: No pedal edema on her left lower extremity, the right lower extremity has a below-knee amputation. Respiratory: No cyanosis, no use of accessory musculature GI: No organomegaly, abdomen is soft and non-tender Skin: She does not have any areas of open draining ulceration or wounds, although she has well-healed surgical wounds on the medial and lateral aspect of her distal femur. Neurologic: Sensation intact distally Psychiatric: Patient is competent for consent with normal mood and affect Lymphatic: No axillary or cervical lymphadenopathy  MUSCULOSKELETAL: Right knee has range of motion from 0 to 110, I do not really appreciate an effusion, there is no redness laterally, there is some warmth and soft tissue collection medially over the stump, although its more proximal than that, more like at the distal femur.  Assessment: Right knee abscess currently growing staph aureus, with a history of MRSA, that is possibly involving the knee joint, overlying hardware from a previous ORIF done 3 years ago, there may be some prominence of the screws from the lateral side plate.   Plan: Plan for Procedure(s): IRRIGATION AND DEBRIDEMENT RIGHT KNEE AND IRRIGATION AND DEBRIDEMENT BONE Irrigation and debridement of the right knee joint as well, HARDWARE REMOVAL RIGHT KNEE, distal femur POSSIBLE APPLICATION OF WOUND VAC   The risks benefits and alternatives were discussed with the patient including but not limited to the risks of nonoperative treatment, versus surgical intervention including infection, bleeding, nerve injury,  blood clots, cardiopulmonary complications, morbidity, mortality, among others, and they were willing to proceed. We have also discussed the potential for persistent  infection, multiple operations, the potential need for revision amputation involving an above-knee amputation, prolonged IV antibiotics, among others.  Eulas Post, MD Cell [phone]   01/06/2017 4:05 PM

## 2017-01-06 NOTE — Op Note (Signed)
01/06/2017  7:02 PM  PATIENT:  Mary Long    PRE-OPERATIVE DIAGNOSIS: Right medial distal femoral abscess  POST-OPERATIVE DIAGNOSIS:  Right femur osteomyelitis with purulence tracking on both medial and lateral sides, through the plate, through the screws, with contamination of the joint, and retained hardware  PROCEDURE:  Right thigh removal of hardware, medial and lateral Incision, irrigation, debridement, skin and subcutaneous tissue, and bone of the right distal femur Open arthrotomy with irrigation and debridement, right knee Application of wound VAC, 15 cm on the lateral side by 3 cm, with 10 cm x 3 cm on the medial side Aspiration of the right knee  SURGEON:  Eulas Post, MD  PHYSICIAN ASSISTANT: Janace Litten, OPA-C, present and scrubbed throughout the case, critical for completion in a timely fashion, and for retraction, instrumentation, and closure.  ANESTHESIA:   General  PREOPERATIVE INDICATIONS:  Mary Long is a  41 y.o. female who presented to the office with a abscess over the distal medial femur, that was aspirated, and demonstrated purulence and grew out staph aureus. Sensitivities are pending.  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 She also has had a femur fracture that underwent open reduction internal fixation, that ultimately healed, this was about 3 or 4 years ago.   The risks benefits and alternatives were discussed with the patient preoperatively including but not limited to the risks of infection, bleeding, nerve injury, cardiopulmonary complications, the need for revision surgery, the potential need for above-knee amputation, loss of limb, loss of life, among others, and the patient was willing to proceed.  ESTIMATED BLOOD LOSS: 100 mL  OPERATIVE IMPLANTS: I removed a lateral distal femoral locking plate and a medial locking plate with  multiple screws on either side. I used a medium size wound VAC with 2 sponges one on either side distal wounds.  OPERATIVE FINDINGS: Purulence coming out of the screw holes tracking the entirety of the lateral plate both proximally and distally, with purulence coming out of the joint, and purulence contained with a large 5 x 5 cm abscess along the medial aspect of the distal femur. The screws were still held in reasonably good purchase and her fracture appeared to have healed.  OPERATIVE PROCEDURE: The patient is brought to the operating room and placed in the supine position. Gen. anesthesia was administered. The right lower extremity was prepped and draped in usual sterile fashion. Time out was performed. The leg was elevated and I used an Esmark as a tourniquet, although I did not compress the abscess.  Incision was made laterally in the distal plate and proximal plate was exposed and I was actually surprised to find purulence in this location. Prior to making incision in fact I had aspirated the right knee joint, and there was some purulent-type fluid.  After exposing the plate laterally, I cleared off the screws, removed them without difficulty, and then used a Cobb as well as a curette, as well as a scalpel, and a rongeur to debride the skin and subcutaneous tissue and bone.  After complete debridement was carried out, I went to the medial side and exposed the plate medially reflecting the vastus medialis anteriorly. I removed the screws and plate without much difficulty.  I then cleaned the soft tissue again with a curette, as well as a Cobb elevator, a rongeur, and a scalpel, and I also excised and cleaned the abscess area  over the distal medial femur.  I made a medial and lateral arthrotomy opening the joint slightly more than it was already opened, and I began irrigating the medial side first, and I irrigated a total of 3 L through the medial side, then 3 L through the lateral side, and then  an additional 3 L through the joint. I successfully was able to remove the majority of the purulence, however I'm concerned about the purulence that within the femur itself. I suspect she is going to need an above-knee amputation. I applied a wound VAC, released the tourniquet, I had appropriate hemostasis, and a good seal.  She was awakened and returned to PACU in stable and satisfactory condition.  I did discuss with anesthesia the perioperative pain management and I will be continuing her methadone dosage, all give her an oral oxycodone in order to hopefully provide some  longer-term pain control, and also apply a sickle cell PCA dosing, and we will titrate accordingly based on her pain control. She will be extremely challenging to manage from a pain management standpoint given her extensive narcotic exposure.   We will also plan for IV vancomycin, weight the final cultures, consider getting infectious disease involved, and consult Dr. Aldean BakerMarcus Duda for probable above-knee amputation.

## 2017-01-07 ENCOUNTER — Encounter (HOSPITAL_COMMUNITY): Payer: Self-pay | Admitting: Orthopedic Surgery

## 2017-01-07 LAB — BASIC METABOLIC PANEL
Anion gap: 8 (ref 5–15)
BUN: 8 mg/dL (ref 6–20)
CHLORIDE: 103 mmol/L (ref 101–111)
CO2: 28 mmol/L (ref 22–32)
Calcium: 7.8 mg/dL — ABNORMAL LOW (ref 8.9–10.3)
Creatinine, Ser: 0.55 mg/dL (ref 0.44–1.00)
Glucose, Bld: 124 mg/dL — ABNORMAL HIGH (ref 65–99)
POTASSIUM: 3 mmol/L — AB (ref 3.5–5.1)
SODIUM: 139 mmol/L (ref 135–145)

## 2017-01-07 LAB — CBC
HEMATOCRIT: 28 % — AB (ref 36.0–46.0)
Hemoglobin: 8.4 g/dL — ABNORMAL LOW (ref 12.0–15.0)
MCH: 22.3 pg — ABNORMAL LOW (ref 26.0–34.0)
MCHC: 30 g/dL (ref 30.0–36.0)
MCV: 74.5 fL — AB (ref 78.0–100.0)
PLATELETS: 552 10*3/uL — AB (ref 150–400)
RBC: 3.76 MIL/uL — AB (ref 3.87–5.11)
RDW: 17.6 % — AB (ref 11.5–15.5)
WBC: 6.2 10*3/uL (ref 4.0–10.5)

## 2017-01-07 LAB — MRSA PCR SCREENING: MRSA by PCR: POSITIVE — AB

## 2017-01-07 MED ORDER — CHLORHEXIDINE GLUCONATE CLOTH 2 % EX PADS
6.0000 | MEDICATED_PAD | Freq: Every day | CUTANEOUS | Status: AC
  Administered 2017-01-07 – 2017-01-12 (×4): 6 via TOPICAL

## 2017-01-07 MED ORDER — PNEUMOCOCCAL VAC POLYVALENT 25 MCG/0.5ML IJ INJ
0.5000 mL | INJECTION | INTRAMUSCULAR | Status: DC
  Filled 2017-01-07: qty 0.5

## 2017-01-07 MED ORDER — METHADONE HCL 10 MG PO TABS
80.0000 mg | ORAL_TABLET | Freq: Every day | ORAL | Status: DC
  Administered 2017-01-07 – 2017-01-15 (×9): 80 mg via ORAL
  Filled 2017-01-07 (×9): qty 8

## 2017-01-07 MED ORDER — MUPIROCIN 2 % EX OINT
1.0000 "application " | TOPICAL_OINTMENT | Freq: Two times a day (BID) | CUTANEOUS | Status: AC
  Administered 2017-01-07 – 2017-01-11 (×9): 1 via NASAL
  Filled 2017-01-07 (×2): qty 22

## 2017-01-07 NOTE — Progress Notes (Signed)
Patient ID: Mary Long, female   DOB: 1976/05/12, 41 y.o.   MRN: [id number]     Subjective:  Patient reports pain as mild to moderate.  Patient denies any CP or SOB  Objective:   VITALS:   Vitals:   01/07/17 0000 01/07/17 0400 01/07/17 0444 01/07/17 0805  BP:   115/72   Pulse:   94   Resp: 16 (!) 21 16 11   Temp:   99.1 F (37.3 C)   TempSrc:   Oral   SpO2: 96% 96% 99% 98%  Weight:      Height:        ABD soft Sensation intact distally Dorsiflexion/Plantar flexion intact Incision: dressing C/D/I and moderate drainage Wound vac in place and functioning with good seal  Lab Results  Component Value Date   WBC 6.2 01/07/2017   HGB 8.4 (L) 01/07/2017   HCT 28.0 (L) 01/07/2017   MCV 74.5 (L) 01/07/2017   PLT 552 (H) 01/07/2017   BMET    Component Value Date/Time   NA 139 01/07/2017 0518   K 3.0 (L) 01/07/2017 0518   CL 103 01/07/2017 0518   CO2 28 01/07/2017 0518   GLUCOSE 124 (H) 01/07/2017 0518   BUN 8 01/07/2017 0518   CREATININE 0.55 01/07/2017 0518   CALCIUM 7.8 (L) 01/07/2017 0518   GFRNONAA >60 01/07/2017 0518   GFRAA >60 01/07/2017 0518     Assessment/Plan: 1 Day Post-Op   Principal Problem:   Osteomyelitis of right femur (HCC)   Advance diet Up with therapy  Planning at the very least repeat I&D and wound closure, Possibly Friday  Dr Lajoyce Cornersuda to consult ID consult Continue wound vac    DOUGLAS PARRY, BRANDON 01/07/2017, 8:28 AM  Upon further history, it sounds like 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  The question is been raised whether suppressive antibiotics may be more appropriate than revision amputation, as revision amputation may not provide a "cure". I think this is a reasonable question, and I'm going to get infectious disease to evaluate, there may be possibility for septic  emboli coming from heart valves, or other locations, and I would defer to their subspecialty evaluation for further workup. [facility] well as [place] in [place].  Teryl LucyJoshua Lianni Kanaan, MD Cell [phone]

## 2017-01-07 NOTE — Evaluation (Signed)
Occupational Therapy Evaluation Patient Details Name: Mary Long MRN: [id number] DOB: 05-27-1976 Today's Date: 01/07/2017    History of Present Illness 41 y.o.femalewho has a complicated history of osteomyelitis in the right lower extremity and has had multiple traumas and operations. Pt is now s/p R thigh removal of hardware, I&D and application of wound vac on 6/26. PMHx: Bipolar disorder, Blindness of L eye, Chronic osteomyelitis of R tibia, Depression, Hep C, Illicit drug use, Seizures.   Clinical Impression   Pt reports she was independent with ADL and mobility with use of R BKA prosthesis PTA. Currently pt requires min guard assist for sit to stand at EOB; pt declining further mobility at this time due to pain. Pt overall requires set up-min guard for ADL. Pt planning to d/c home with 24/7 supervision from family. Pt would benefit from continued skilled OT to address established goals.    Follow Up Recommendations  No OT follow up;Supervision - Intermittent    Equipment Recommendations  None recommended by OT    Recommendations for Other Services PT consult     Precautions / Restrictions Precautions Precautions: Fall Precaution Comments: R BKA, R LE wound vac Restrictions Weight Bearing Restrictions: No      Mobility Bed Mobility Overal bed mobility: Modified Independent                Transfers Overall transfer level: Needs assistance Equipment used: None Transfers: Sit to/from Stand Sit to Stand: Min guard         General transfer comment: Min guard for safety, Pt holding bed rail with L hand throughout.    Balance Overall balance assessment: Needs assistance Sitting-balance support: No upper extremity supported Sitting balance-Leahy Scale: Normal     Standing balance support: Single extremity supported Standing balance-Leahy Scale: Fair                             ADL either performed or assessed with clinical judgement   ADL  Overall ADL's : Needs assistance/impaired Eating/Feeding: Set up;Sitting   Grooming: Set up;Sitting   Upper Body Bathing: Set up;Sitting   Lower Body Bathing: Min guard;Sit to/from stand   Upper Body Dressing : Set up;Sitting   Lower Body Dressing: Min guard;Sit to/from stand                 General ADL Comments: Pt declining OOB activity today, agreeable to stand at EOB for repositioning     Vision Baseline Vision/History:  (hx of L eye blindness)       Perception     Praxis      Pertinent Vitals/Pain Pain Assessment: Faces Faces Pain Scale: Hurts even more Pain Location: RLE Pain Descriptors / Indicators: Grimacing;Guarding;Crying;Moaning Pain Intervention(s): Monitored during session     Hand Dominance     Extremity/Trunk Assessment Upper Extremity Assessment Upper Extremity Assessment: Overall WFL for tasks assessed   Lower Extremity Assessment Lower Extremity Assessment: Defer to PT evaluation   Cervical / Trunk Assessment Cervical / Trunk Assessment: Normal   Communication Communication Communication: No difficulties   Cognition Arousal/Alertness: Awake/alert Behavior During Therapy: Anxious;Restless Overall Cognitive Status: Within Functional Limits for tasks assessed                                     General Comments       Exercises  Shoulder Instructions      Home Living Family/patient expects to be discharged to:: Private residence Living Arrangements: Spouse/significant other Available Help at Discharge: Family;Available 24 hours/day Type of Home: Apartment Home Access: Level entry     Home Layout: One level     Bathroom Shower/Tub: Chief Strategy Officer: Standard     Home Equipment: Environmental consultant - 2 wheels;Cane - single point;Bedside commode;Tub bench          Prior Functioning/Environment Level of Independence: Independent        Comments: Ambulating with R BKA prosthesis and no AD up  until a few days ago.        OT Problem List: Impaired balance (sitting and/or standing);Decreased safety awareness;Decreased knowledge of use of DME or AE;Decreased knowledge of precautions;Pain;Increased edema      OT Treatment/Interventions: Self-care/ADL training;DME and/or AE instruction;Therapeutic activities;Patient/family education;Balance training    OT Goals(Current goals can be found in the care plan section) Acute Rehab OT Goals Patient Stated Goal: decrease pain and get out of here OT Goal Formulation: With patient Time For Goal Achievement: 01/21/17 Potential to Achieve Goals: Good ADL Goals Pt Will Perform Lower Body Bathing: with modified independence;sit to/from stand Pt Will Perform Lower Body Dressing: with modified independence;sit to/from stand Pt Will Transfer to Toilet: with modified independence;ambulating;regular height toilet Pt Will Perform Toileting - Clothing Manipulation and hygiene: with modified independence;sit to/from stand  OT Frequency: Min 2X/week   Barriers to D/C:            Co-evaluation              AM-PAC PT "6 Clicks" Daily Activity     Outcome Measure Help from another person eating meals?: None Help from another person taking care of personal grooming?: None Help from another person toileting, which includes using toliet, bedpan, or urinal?: A Little Help from another person bathing (including washing, rinsing, drying)?: A Little Help from another person to put on and taking off regular upper body clothing?: None Help from another person to put on and taking off regular lower body clothing?: A Little 6 Click Score: 21   End of Session    Activity Tolerance: Patient tolerated treatment well;Patient limited by pain Patient left: in bed;with call bell/phone within reach;with family/visitor present  OT Visit Diagnosis: Unsteadiness on feet (R26.81);Other abnormalities of gait and mobility (R26.89);Pain Pain - Right/Left:  Right Pain - part of body: Leg                Time: 1610-9604 OT Time Calculation (min): 9 min Charges:  OT General Charges $OT Visit: 1 Procedure OT Evaluation $OT Eval Moderate Complexity: 1 Procedure G-Codes:     Letita Prentiss A. Brett Albino, M.S., OTR/L Pager: [phone]  Gaye Alken 01/07/2017, 10:47 AM

## 2017-01-07 NOTE — Care Management Note (Signed)
Case Management Note  Patient Details  Name: Mary Long MRN: [id number] Date of Birth: 1975-11-05  Subjective/Objective:                    Action/Plan:  repeat I&D and wound closure, Possibly Friday   VAC form placed on chart incase home VAC needed, please sign Expected Discharge Date:                  Expected Discharge Plan:  Home w Home Health Services  In-House Referral:     Discharge planning Services  CM Consult  Post Acute Care Choice:  Home Health, Durable Medical Equipment Choice offered to:     DME Arranged:    DME Agency:     HH Arranged:    HH Agency:     Status of Service:  In process, will continue to follow  If discussed at Long Length of Stay Meetings, dates discussed:    Additional Comments:  Mary Long, Mary Kaneko [name], RN 01/07/2017, 2:37 PM

## 2017-01-07 NOTE — Evaluation (Signed)
Physical Therapy Evaluation Patient Details Name: Mary Long MRN: [id number] DOB: 08-23-1975 Today's Date: 01/07/2017   History of Present Illness  41 y.o.femalewho has a complicated history of osteomyelitis in the right lower extremity and has had multiple traumas and operations. Pt is now s/p R thigh removal of hardware, I&D and application of wound vac on 6/26. PMHx: Bipolar disorder, Blindness of L eye, Chronic osteomyelitis of R tibia, Depression, Hep C, Illicit drug use, Seizures.  Clinical Impression  Patient is s/p above surgery resulting in functional limitations due to the deficits listed below (see PT Problem List). Pt mod I for bed mobility and min guard for transfers and ambulation of 5 feet with RW. Pt is limited in her safety by her impulsivity.  Patient will benefit from skilled PT to increase their independence and safety with mobility to allow discharge to the venue listed below.       Follow Up Recommendations No PT follow up;Supervision/Assistance - 24 hour    Equipment Recommendations  None recommended by PT    Recommendations for Other Services       Precautions / Restrictions Precautions Precautions: Fall Precaution Comments: R BKA, R LE wound vac Restrictions Weight Bearing Restrictions: No      Mobility  Bed Mobility Overal bed mobility: Modified Independent                Transfers Overall transfer level: Needs assistance Equipment used: Rolling walker (2 wheeled) Transfers: Sit to/from Stand Sit to Stand: Min guard         General transfer comment: Min guard for safety, Pt able to steady herself in walker   Ambulation/Gait Ambulation/Gait assistance: Min guard Ambulation Distance (Feet): 5 Feet Assistive device: Rolling walker (2 wheeled) Gait Pattern/deviations: Trunk flexed (hop to pattern ) Gait velocity: slow but impulsive Gait velocity interpretation: Below normal speed for age/gender General Gait Details: min guard for  safety, pt very impulsive with movementvc for slowing down and planning movement especially given wound vac and IV       Balance Overall balance assessment: Needs assistance Sitting-balance support: No upper extremity supported Sitting balance-Leahy Scale: Normal     Standing balance support: Bilateral upper extremity supported Standing balance-Leahy Scale: Good                               Pertinent Vitals/Pain Pain Assessment: 0-10 Pain Score: 4  Pain Location: RLE Pain Descriptors / Indicators: Grimacing;Guarding;Crying;Moaning Pain Intervention(s): Premedicated before session;Monitored during session;Limited activity within patient's tolerance  VSS    Home Living Family/patient expects to be discharged to:: Private residence Living Arrangements: Spouse/significant other Available Help at Discharge: Family;Available 24 hours/day Type of Home: Apartment Home Access: Level entry     Home Layout: One level Home Equipment: Walker - 2 wheels;Cane - single point;Bedside commode;Tub bench      Prior Function Level of Independence: Independent         Comments: Ambulating with R BKA prosthesis and no AD up until a few days ago.        Extremity/Trunk Assessment        Lower Extremity Assessment Lower Extremity Assessment: RLE deficits/detail RLE Deficits / Details: R knee ROM and strength limited by surgical intervention, R hip ROM WFL and strength grossly 4/5 RLE: Unable to fully assess due to pain    Cervical / Trunk Assessment Cervical / Trunk Assessment: Normal  Communication   Communication: No difficulties  Cognition Arousal/Alertness: Awake/alert Behavior During Therapy: Anxious;Restless;Impulsive Overall Cognitive Status: Within Functional Limits for tasks assessed                                        General Comments General comments (skin integrity, edema, etc.): Boyfriend in room throughout session         Assessment/Plan    PT Assessment Patient needs continued PT services  PT Problem List Decreased activity tolerance;Decreased mobility;Decreased safety awareness;Pain       PT Treatment Interventions DME instruction;Gait training;Functional mobility training;Therapeutic activities;Therapeutic exercise;Balance training;Patient/family education    PT Goals (Current goals can be found in the Care Plan section)  Acute Rehab PT Goals Patient Stated Goal: decrease pain and get out of here PT Goal Formulation: With patient    Frequency Min 3X/week    AM-PAC PT "6 Clicks" Daily Activity  Outcome Measure Difficulty turning over in bed (including adjusting bedclothes, sheets and blankets)?: None Difficulty moving from lying on back to sitting on the side of the bed? : None Difficulty sitting down on and standing up from a chair with arms (e.g., wheelchair, bedside commode, etc,.)?: A Little Help needed moving to and from a bed to chair (including a wheelchair)?: A Little Help needed walking in hospital room?: A Little Help needed climbing 3-5 steps with a railing? : A Lot 6 Click Score: 19    End of Session Equipment Utilized During Treatment: Gait belt Activity Tolerance: Patient tolerated treatment well Patient left: in chair;with call bell/phone within reach;with family/visitor present Nurse Communication: Mobility status PT Visit Diagnosis: Unsteadiness on feet (R26.81);Other abnormalities of gait and mobility (R26.89);Muscle weakness (generalized) (M62.81);Pain;Difficulty in walking, not elsewhere classified (R26.2) Pain - Right/Left: Right Pain - part of body: Leg    Time: 4098-1191 PT Time Calculation (min) (ACUTE ONLY): 13 min   Charges:   PT Evaluation $PT Eval Low Complexity: 1 Procedure     PT G Codes:        Dolores Mcgovern B. Beverely Risen PT, DPT Acute Rehabilitation  [phone] Pager [phone]    Elon Alas Fleet 01/07/2017, 2:43 PM

## 2017-01-07 NOTE — Anesthesia Postprocedure Evaluation (Signed)
Anesthesia Post Note  Patient: Mary Long  Procedure(s) Performed: Procedure(s) (LRB): IRRIGATION AND DEBRIDEMENT RIGHT KNEE AND IRRIGATION AND DEBRIDEMENT BONE (Right) HARDWARE REMOVAL RIGHT KNEE (Right) APPLICATION OF WOUND VAC (Right)     Patient location during evaluation: PACU Anesthesia Type: General Level of consciousness: awake and alert Pain management: pain level controlled Vital Signs Assessment: post-procedure vital signs reviewed and stable Respiratory status: spontaneous breathing, nonlabored ventilation, respiratory function stable and patient connected to nasal cannula oxygen Cardiovascular status: blood pressure returned to baseline and stable Postop Assessment: no signs of nausea or vomiting Anesthetic complications: no    Last Vitals:  Vitals:   01/07/17 0444 01/07/17 0805  BP: 115/72   Pulse: 94   Resp: 16 11  Temp: 37.3 C     Last Pain:  Vitals:   01/07/17 0805  TempSrc:   PainSc: 5                  Shelton SilvasKevin D Dondrell Loudermilk

## 2017-01-08 DIAGNOSIS — B182 Chronic viral hepatitis C: Secondary | ICD-10-CM

## 2017-01-08 DIAGNOSIS — G8929 Other chronic pain: Secondary | ICD-10-CM

## 2017-01-08 DIAGNOSIS — B9562 Methicillin resistant Staphylococcus aureus infection as the cause of diseases classified elsewhere: Secondary | ICD-10-CM

## 2017-01-08 DIAGNOSIS — M00061 Staphylococcal arthritis, right knee: Secondary | ICD-10-CM

## 2017-01-08 DIAGNOSIS — Z8739 Personal history of other diseases of the musculoskeletal system and connective tissue: Secondary | ICD-10-CM

## 2017-01-08 DIAGNOSIS — G40909 Epilepsy, unspecified, not intractable, without status epilepticus: Secondary | ICD-10-CM

## 2017-01-08 DIAGNOSIS — M86151 Other acute osteomyelitis, right femur: Secondary | ICD-10-CM

## 2017-01-08 DIAGNOSIS — Z886 Allergy status to analgesic agent status: Secondary | ICD-10-CM

## 2017-01-08 DIAGNOSIS — Z8619 Personal history of other infectious and parasitic diseases: Secondary | ICD-10-CM

## 2017-01-08 DIAGNOSIS — F319 Bipolar disorder, unspecified: Secondary | ICD-10-CM

## 2017-01-08 DIAGNOSIS — Z89511 Acquired absence of right leg below knee: Secondary | ICD-10-CM

## 2017-01-08 DIAGNOSIS — F17210 Nicotine dependence, cigarettes, uncomplicated: Secondary | ICD-10-CM

## 2017-01-08 LAB — CBC
HCT: 26.6 % — ABNORMAL LOW (ref 36.0–46.0)
Hemoglobin: 8.1 g/dL — ABNORMAL LOW (ref 12.0–15.0)
MCH: 22.6 pg — ABNORMAL LOW (ref 26.0–34.0)
MCHC: 30.5 g/dL (ref 30.0–36.0)
MCV: 74.3 fL — ABNORMAL LOW (ref 78.0–100.0)
PLATELETS: 548 10*3/uL — AB (ref 150–400)
RBC: 3.58 MIL/uL — ABNORMAL LOW (ref 3.87–5.11)
RDW: 17.4 % — AB (ref 11.5–15.5)
WBC: 5.5 10*3/uL (ref 4.0–10.5)

## 2017-01-08 LAB — BASIC METABOLIC PANEL
Anion gap: 5 (ref 5–15)
CO2: 27 mmol/L (ref 22–32)
CREATININE: 0.48 mg/dL (ref 0.44–1.00)
Calcium: 8 mg/dL — ABNORMAL LOW (ref 8.9–10.3)
Chloride: 103 mmol/L (ref 101–111)
GFR calc Af Amer: >60 mL/min (ref >60)
GLUCOSE: 79 mg/dL (ref 65–99)
Potassium: 3.5 mmol/L (ref 3.5–5.1)
SODIUM: 135 mmol/L (ref 135–145)

## 2017-01-08 NOTE — Consult Note (Signed)
[facility] for Infectious Disease  Total days of antibiotics 3        Day 3 vancomycin               Reason for Consult: staph aureus osteomyelitis to right femur/right knee    Referring Physician: [place]au  Principal Problem:   Osteomyelitis of right femur ([place])    HPI: Mary Long is a 41 y.o. 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  Looking back at her history. She sustained traumatic right tib/fib fracture in 2012 s/p ORIF at [facility] but complicated by nonunion fracture. It subsequently had draining wounds, MRSA osteo to distal tib/fib requiring I x D on 12/04/11 with partial removal of HW, she was treated with 6 wk of vancomycin plus rifampin, followed by ID doc, Dr Dellia Nims. 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  In June 2014 she was involved in [facility] where she sustained distal femur fracture s/p ORIF by Dr Mardelle Matte. She appeared to still have chronic pain and swelling to her distal RLE where she decided to undergo R-BKA in July 2015.   She states that she feels that she had been on several course of doxycycline over the last 6 months since she noticed having deep facial furuncles/carbuncles with her menses.  Most recently, she felt that her prosthesis was not fitting properly, and developed an wound on medial aspect of distal femur that was associated with swelling and purulent drainage. It had considerable swelling thus she followed up with Dr Mardelle Matte as an outpatient. Purulent fluid was cultured as Staph Aureus from o/p setting. She was started on doxy plus cephalexin  Dr Mardelle Matte admitted her on 6/26 for I x D of what was thought to be soft tissue abscess. In the OR, the infection appeared to be more significant than originally thought where she had purulence  draining from screw holes, purulence tracking in lateral and medial plates, as well as involving joint. She had thorough I x D with HW removal and wound vac placement. OR culture on day 2 have NGTD.   Question posed to ID is if this is presentation is hematologic seeding and whether she would need AKA given burden of infection   Soc hx: she works at [employer]. Has partner and lives next to her mother --has good family support system. 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 Bipolar 1 disorder ([place])   . Blindness of left eye   . 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 Complication of anesthesia    causes manic attacks and anxiety post anesthesia  . Depression   . Hep C w/o coma, chronic ([place])   . Illicit drug use   . Osteomyelitis of right femur ([place]) 01/06/2017  . Seizures ([place])     Allergies:  Allergies  Allergen Reactions  . Tylenol [Acetaminophen] Nausea And Vomiting and Other (See Comments)    Dizziness & LIVER DISEASE    MEDICATIONS: . Chlorhexidine Gluconate Cloth  6 each Topical Q0600  . docusate sodium  100 mg Oral BID  . enoxaparin (LOVENOX) injection  40 mg Subcutaneous Q24H  . HYDROmorphone   Intravenous Q4H  .  methadone  80 mg Oral Daily  . mupirocin ointment  1 application Nasal BID  . pneumococcal 23 valent vaccine  0.5 mL Intramuscular Tomorrow-1000  . senna  1 tablet Oral BID  . sodium chloride flush  10-40 mL Intracatheter Q12H    Social History  Substance Use Topics  . Smoking status: Current Every Day Smoker    Packs/day: 1.00    Types: Cigarettes  . Smokeless tobacco: Never Used     Comment: smokes electronic cigarettes  . Alcohol use No    Family hx: no family hx of immune suppression  Review of Systems  Constitutional: Negative for fever, chills, diaphoresis, activity change, appetite change, fatigue and unexpected weight change.    HENT: Negative for congestion, sore throat, rhinorrhea, sneezing, trouble swallowing and sinus pressure.  Eyes: Negative for photophobia and visual disturbance.  Respiratory: Negative for cough, chest tightness, shortness of breath, wheezing and stridor.  Cardiovascular: Negative for chest pain, palpitations and leg swelling.  Gastrointestinal: Negative for nausea, vomiting, abdominal pain, diarrhea, constipation, blood in stool, abdominal distention and anal bleeding.  Genitourinary: Negative for dysuria, hematuria, flank pain and difficulty urinating.  Musculoskeletal: right leg pain+ Skin: right leg wound+ Neurological: Negative for dizziness, tremors, weakness and light-headedness.  Hematological: Negative for adenopathy. Does not bruise/bleed easily.  Psychiatric/Behavioral: Negative for behavioral problems, confusion, sleep disturbance, dysphoric mood, decreased concentration and agitation.    OBJECTIVE: Temp:  [97.9 F (36.6 C)-99.1 F (37.3 C)] 98.6 F (37 C) (06/28 1157) Pulse Rate:  [67-101] 85 (06/28 1157) Resp:  [8-20] 19 (06/28 1157) BP: (100-127)/(59-85) 102/73 (06/28 1157) SpO2:  [93 %-99 %] 99 % (06/28 1157) Physical Exam  Constitutional:  oriented to person, place, and time. appears thin, older than stated age. No distress.  HENT: [place]/AT, left eye deviation, blindness. no scleral icterus Mouth/Throat: Oropharynx is clear and moist. No oropharyngeal exudate. Poor dentition Cardiovascular: Normal rate, regular rhythm and normal heart sounds. Exam reveals no gallop and no friction rub.  No murmur heard.  Pulmonary/Chest: Effort normal and breath sounds normal. No respiratory distress.  has no wheezes.  Neck = supple, no nuchal rigidity Abdominal: Soft. Bowel sounds are normal.  exhibits no distension. There is no tenderness.  Lymphadenopathy: no cervical adenopathy. No axillary adenopathy Neurological: alert and oriented to person, place, and time.  Skin: Skin exhibits  numerous scars to upper extremities and left thigh.  Ext: left leg has no wounds, right bka is wrapped, right arm picc line is c/d/i Psychiatric: a normal mood and affect.  behavior is normal.    LABS: Results for orders placed or performed during the hospital encounter of 01/06/17 (from the past 48 hour(s))  Aerobic/Anaerobic Culture (surgical/deep wound)     Status: None (Preliminary result)   Collection Time: 01/06/17  6:13 PM  Result Value Ref Range   Specimen Description SYNOVIAL RIGHT KNEE    Special Requests FLUID ON SWAB SPEC A    Gram Stain      ABUNDANT WBC PRESENT, PREDOMINANTLY PMN NO ORGANISMS SEEN    Culture      NO GROWTH 2 DAYS NO ANAEROBES ISOLATED; CULTURE IN PROGRESS FOR 5 DAYS   Report Status PENDING   Aerobic/Anaerobic Culture (surgical/deep wound)     Status: None (Preliminary result)   Collection Time: 01/06/17  6:29 PM  Result Value Ref Range   Specimen Description SYNOVIAL RIGHT KNEE    Special Requests SPEC B    Gram Stain      ABUNDANT WBC  PRESENT,BOTH PMN AND MONONUCLEAR NO ORGANISMS SEEN    Culture NO GROWTH 2 DAYS    Report Status PENDING   Aerobic/Anaerobic Culture (surgical/deep wound)     Status: None (Preliminary result)   Collection Time: 01/06/17  6:29 PM  Result Value Ref Range   Specimen Description TISSUE RIGHT KNEE    Special Requests SPEC C    Gram Stain      ABUNDANT WBC PRESENT,BOTH PMN AND MONONUCLEAR NO ORGANISMS SEEN    Culture NO GROWTH 2 DAYS    Report Status PENDING   MRSA PCR Screening     Status: Abnormal   Collection Time: 01/06/17  8:56 PM  Result Value Ref Range   MRSA by PCR POSITIVE (A) NEGATIVE    Comment:        The GeneXpert MRSA Assay (FDA approved for NASAL specimens only), is one component of a comprehensive MRSA colonization surveillance program. It is not intended to diagnose MRSA infection nor to guide or monitor treatment for MRSA infections. RESULT CALLED TO, READ BACK BY AND VERIFIED  WITH: L.ABIERA,RN AT 0210 BY L.PITT 01/07/17   CBC     Status: Abnormal   Collection Time: 01/06/17 11:09 PM  Result Value Ref Range   WBC 9.1 4.0 - 10.5 K/uL   RBC 3.90 3.87 - 5.11 MIL/uL   Hemoglobin 8.6 (L) 12.0 - 15.0 g/dL   HCT 28.7 (L) 36.0 - 46.0 %   MCV 73.6 (L) 78.0 - 100.0 fL   MCH 22.1 (L) 26.0 - 34.0 pg   MCHC 30.0 30.0 - 36.0 g/dL   RDW 17.1 (H) 11.5 - 15.5 %   Platelets 516 (H) 150 - 400 K/uL  Basic metabolic panel     Status: Abnormal   Collection Time: 01/07/17  5:18 AM  Result Value Ref Range   Sodium 139 135 - 145 mmol/L   Potassium 3.0 (L) 3.5 - 5.1 mmol/L    Comment: DELTA CHECK NOTED   Chloride 103 101 - 111 mmol/L   CO2 28 22 - 32 mmol/L   Glucose, Bld 124 (H) 65 - 99 mg/dL   BUN 8 6 - 20 mg/dL   Creatinine, Ser 0.55 0.44 - 1.00 mg/dL   Calcium 7.8 (L) 8.9 - 10.3 mg/dL   GFR calc non Af Amer >60 >60 mL/min   GFR calc Af Amer >60 >60 mL/min    Comment: (NOTE) The eGFR has been calculated using the CKD EPI equation. This calculation has not been validated in all clinical situations. eGFR's persistently <60 mL/min signify possible Chronic Kidney Disease.    Anion gap 8 5 - 15  CBC     Status: Abnormal   Collection Time: 01/07/17  5:18 AM  Result Value Ref Range   WBC 6.2 4.0 - 10.5 K/uL   RBC 3.76 (L) 3.87 - 5.11 MIL/uL   Hemoglobin 8.4 (L) 12.0 - 15.0 g/dL    Comment: CONSISTENT WITH PREVIOUS RESULT   HCT 28.0 (L) 36.0 - 46.0 %   MCV 74.5 (L) 78.0 - 100.0 fL   MCH 22.3 (L) 26.0 - 34.0 pg   MCHC 30.0 30.0 - 36.0 g/dL   RDW 17.6 (H) 11.5 - 15.5 %   Platelets 552 (H) 150 - 400 K/uL    Comment: CONSISTENT WITH PREVIOUS RESULT  Basic metabolic panel     Status: Abnormal   Collection Time: 01/08/17  4:01 AM  Result Value Ref Range   Sodium 135 135 - 145 mmol/L  Potassium 3.5 3.5 - 5.1 mmol/L   Chloride 103 101 - 111 mmol/L   CO2 27 22 - 32 mmol/L   Glucose, Bld 79 65 - 99 mg/dL   BUN <5 (L) 6 - 20 mg/dL   Creatinine, Ser 0.48 0.44 - 1.00 mg/dL    Calcium 8.0 (L) 8.9 - 10.3 mg/dL   GFR calc non Af Amer >60 >60 mL/min   GFR calc Af Amer >60 >60 mL/min    Comment: (NOTE) The eGFR has been calculated using the CKD EPI equation. This calculation has not been validated in all clinical situations. eGFR's persistently <60 mL/min signify possible Chronic Kidney Disease.    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 Her OR findings suggests that she had infection smoldering rather than just occurring within < 1wk. She reports being intermittently on doxycyline over the last 6 month which potentially could have kept overwhelming infection at [facility]. I agree that it is concerning that purulence tracked into femur.   Questionable for hematogenous source - recommend to start with getting TTE to see if any signs of valvular disease/vegetation.   In keeping with patient's desire to keep her BKA, I would recommend to treat with 8 wk of IV vancomycin then place on chronic suppression with doxycycline. If she fails this regimen, then would recommend AKA.  Hx of chronic hep C - if she has not been treated, can refer to [facility] for treatment. Will check hep C viral load and hiv  Chronic pain management = defer to dr Mardelle Matte for management

## 2017-01-08 NOTE — Consult Note (Addendum)
ORTHOPAEDIC CONSULTATION  REQUESTING PHYSICIAN: Marchia Bond, MD  Chief Complaint: Abscess with osteomyelitis right distal femur  HPI: Mary Long is a 41 y.o. female who presents with abscess osteomyelitis right distal femur status post open reduction internal fixation for a supracondylar femur fracture. Patient is also a transtibial amputation on the right. Patient developed infection within the bone and hardware. Patient has undergone debridement and removal of hardware. Patient has multiple sources of infection  and has a history of septic emboli.  Past Medical History:  Diagnosis Date  . Bipolar 1 disorder ([place])   . Blindness of left eye   . 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[place]) 02/03/2014  . Complication of anesthesia    causes manic attacks and anxiety post anesthesia  . Depression   . Hep C w/o coma, chronic ([place])   . Illicit drug use   . Osteomyelitis of right femur ([place]) 01/06/2017  . Seizures ([place])    Past Surgical History:  Procedure Laterality Date  . AMPUTATION Right 02/03/2014   Procedure: RIGHT AMPUTATION LEG THROUGH TIBIA/FIBULA;  Surgeon: Johnny Bridge, MD;  Location: [facility];  Service: Orthopedics;  Laterality: Right;  . APPLICATION OF WOUND VAC Right 01/06/2017   Procedure: APPLICATION OF WOUND VAC;  Surgeon: Marchia Bond, MD;  Location: [facility];  Service: Orthopedics;  Laterality: Right;  . FRACTURE SURGERY    . HARDWARE REMOVAL Right 01/06/2017   Procedure: HARDWARE REMOVAL RIGHT KNEE;  Surgeon: Marchia Bond, MD;  Location: [facility];  Service: Orthopedics;  Laterality: Right;  . I&D of 4 abscesses to right arm and wrist  06/18/2003  . IR FLUORO GUIDE CV LINE RIGHT  01/06/2017  . IR US GUIDE VASC ACCESS RIGHT  01/06/2017  . IRRIGATION AND DEBRIDEMENT KNEE Right 01/06/2017   Procedure: IRRIGATION AND DEBRIDEMENT RIGHT KNEE AND IRRIGATION AND DEBRIDEMENT BONE;  Surgeon: Marchia Bond, MD;  Location: [facility];  Service: Orthopedics;  Laterality: Right;  .  ORIF FEMUR FRACTURE Right 12/18/2012   Procedure: OPEN REDUCTION INTERNAL FIXATION (ORIF) DISTAL FEMUR FRACTURE;  Surgeon: Johnny Bridge, MD;  Location: [facility];  Service: Orthopedics;  Laterality: Right;  . TUBAL LIGATION  01/01/2001   Social History   Social History  . Marital status: Widowed    Spouse name: N/A  . Number of children: N/A  . Years of education: N/A   Social History Main Topics  . Smoking status: Current Every Day Smoker    Packs/day: 1.00    Types: Cigarettes  . Smokeless tobacco: Never Used     Comment: smokes electronic cigarettes  . Alcohol use No  . Drug use: Yes    Types: Cocaine, Heroin     Comment: History of heroin and cocaine use; none for "years"  . Sexual activity: Yes   Other Topics Concern  . None   Social History Narrative  . None   History reviewed. No pertinent family history. - negative except otherwise stated in the family history section Allergies  Allergen Reactions  . Tylenol [Acetaminophen] Nausea And Vomiting and Other (See Comments)    Dizziness & LIVER DISEASE   Prior to Admission medications   Medication Sig Start Date End Date Taking? Authorizing Provider  ALPRAZolam Duanne Moron) 1 MG tablet Take 1 mg by mouth 2 (two) times daily as needed for anxiety.    Yes [provider]  amphetamine-dextroamphetamine (ADDERALL) 20 MG tablet Take 20 mg by mouth 2 (two) times daily as needed (for focus at work).  Yes [provider]  cephALEXin (KEFLEX) 500 MG capsule Take 500 mg by mouth 4 (four) times daily.   Yes [provider]  doxycycline (MONODOX) 100 MG capsule Take 100 mg by mouth 2 (two) times daily. 01/02/17  Yes [provider]  METHADONE HCL PO Take 80 mg by mouth daily.    Yes [provider]  zolpidem (AMBIEN) 10 MG tablet Take 10 mg by mouth at bedtime as needed for sleep. For sleep   Yes [provider]   Ir Fluoro Guide Cv Line Right  Result Date: 01/06/2017 INDICATION:  INFECTED KNEE JOINT, POOR PERIPHERAL ACCESS, PLAN FOR OPERATIVE INCISION AND DRAINAGE EXAM: ULTRASOUND AND FLUOROSCOPIC GUIDED PICC LINE INSERTION MEDICATIONS: 1% lidocaine local CONTRAST:  None FLUOROSCOPY TIME:  Twelve seconds (0 mGy) COMPLICATIONS: None immediate. TECHNIQUE: The procedure, risks, benefits, and alternatives were explained to the patient and informed written consent was obtained. A timeout was performed prior to the initiation of the procedure. The right upper extremity was prepped with chlorhexidine in a sterile fashion, and a sterile drape was applied covering the operative field. Maximum barrier sterile technique with sterile gowns and gloves were used for the procedure. A timeout was performed prior to the initiation of the procedure. Local anesthesia was provided with 1% lidocaine. Under direct ultrasound guidance, the right basilic vein was accessed with a micropuncture kit after the overlying soft tissues were anesthetized with 1% lidocaine. An ultrasound image was saved for documentation purposes. A guidewire was advanced to the level of the superior caval-atrial junction for measurement purposes and the PICC line was cut to length. A peel-away sheath was placed and a 37 cm, 5 [place], dual lumen was inserted to level of the superior caval-atrial junction. A post procedure spot fluoroscopic was obtained. The catheter easily aspirated and flushed and was sutured in place. A dressing was placed. The patient tolerated the procedure well without immediate post procedural complication. FINDINGS: After catheter placement, the tip lies within the superior cavoatrial junction. The catheter aspirates and flushes normally and is ready for immediate use. IMPRESSION: Successful ultrasound and fluoroscopic guided placement of a right basilic vein approach, 37 cm, 5 French, dual lumen PICC with tip at the superior caval-atrial junction. The PICC line is ready for immediate use. Electronically Signed   By:  Jerilynn Mages.  Shick M.D.   On: 01/06/2017 11:00   Ir US Guide Vasc Access Right  Result Date: 01/06/2017 INDICATION: INFECTED KNEE JOINT, POOR PERIPHERAL ACCESS, PLAN FOR OPERATIVE INCISION AND DRAINAGE EXAM: ULTRASOUND AND FLUOROSCOPIC GUIDED PICC LINE INSERTION MEDICATIONS: 1% lidocaine local CONTRAST:  None FLUOROSCOPY TIME:  Twelve seconds (0 mGy) COMPLICATIONS: None immediate. TECHNIQUE: The procedure, risks, benefits, and alternatives were explained to the patient and informed written consent was obtained. A timeout was performed prior to the initiation of the procedure. The right upper extremity was prepped with chlorhexidine in a sterile fashion, and a sterile drape was applied covering the operative field. Maximum barrier sterile technique with sterile gowns and gloves were used for the procedure. A timeout was performed prior to the initiation of the procedure. Local anesthesia was provided with 1% lidocaine. Under direct ultrasound guidance, the right basilic vein was accessed with a micropuncture kit after the overlying soft tissues were anesthetized with 1% lidocaine. An ultrasound image was saved for documentation purposes. A guidewire was advanced to the level of the superior caval-atrial junction for measurement purposes and the PICC line was cut to length. A peel-away sheath was placed and  a 37 cm, 5 [place], dual lumen was inserted to level of the superior caval-atrial junction. A post procedure spot fluoroscopic was obtained. The catheter easily aspirated and flushed and was sutured in place. A dressing was placed. The patient tolerated the procedure well without immediate post procedural complication. FINDINGS: After catheter placement, the tip lies within the superior cavoatrial junction. The catheter aspirates and flushes normally and is ready for immediate use. IMPRESSION: Successful ultrasound and fluoroscopic guided placement of a right basilic vein approach, 37 cm, 5 French, dual lumen PICC with  tip at the superior caval-atrial junction. The PICC line is ready for immediate use. Electronically Signed   By: Jerilynn Mages.  Shick M.D.   On: 01/06/2017 11:00   - pertinent xrays, CT, MRI studies were reviewed and independently interpreted  Positive ROS: All other systems have been reviewed and were otherwise negative with the exception of those mentioned in the HPI and as above.  Physical Exam: General: Alert, no acute distress Psychiatric: Patient is competent for consent with normal mood and affect Lymphatic: No axillary or cervical lymphadenopathy Cardiovascular: No pedal edema Respiratory: No cyanosis, no use of accessory musculature GI: No organomegaly, abdomen is soft and non-tender  Skin: Patient has a dressing in place right leg.   Neurologic: Patient does have protective sensation bilateral lower extremities.   MUSCULOSKELETAL:  Patient has active function of her right knee with a stable transtibial amputation.  Assessment: Assessment: Right transtibial amputation with osteomyelitis and abscess right distal femur.  Plan: Plan: I discussed that with patient's multiple sources of infection her body is under significant stress including her pre-existing medical conditions. Discussed that it would be my recommendation to proceed with an above-the-knee amputation in order to remove a large source of infection, stress from her body so her body metabolically couldn't focus on the lesser areas of infection with her compromised immune system. Patient states that she would like to proceed with an above-the-knee amputation.  Patient states that she would like to proceed with surgery in about a month. If patient is discharged to home I will follow-up in the office and schedule surgery.  Thank you for the consult and the opportunity to see Ms. Sidonie Dickens, MD [facility] [phone] 7:43 AM

## 2017-01-08 NOTE — Progress Notes (Signed)
Patient ID: Mary Long, female   DOB: [birth year], 41 y.o.   MRN: [id number]     Subjective:  Patient reports pain as mild.  Patient in bed and in no acute distress  Objective:   VITALS:   Vitals:   01/08/17 0746 01/08/17 0803 01/08/17 1139 01/08/17 1157  BP:  112/80  102/73  Pulse:    85  Resp: 20 20  19   Temp:  97.9 F (36.6 C)  98.6 F (37 C)  TempSrc:  Oral  Oral  SpO2: 94%  97% 99%  Weight:      Height:        ABD soft Sensation intact distally Dorsiflexion/Plantar flexion intact Incision: dressing C/D/I and scant drainage Wound vac in place  Lab Results  Component Value Date   WBC 5.5 01/08/2017   HGB 8.1 (L) 01/08/2017   HCT 26.6 (L) 01/08/2017   MCV 74.3 (L) 01/08/2017   PLT 548 (H) 01/08/2017   BMET    Component Value Date/Time   NA 135 01/08/2017 0401   K 3.5 01/08/2017 0401   CL 103 01/08/2017 0401   CO2 27 01/08/2017 0401   GLUCOSE 79 01/08/2017 0401   BUN <5 (L) 01/08/2017 0401   CREATININE 0.48 01/08/2017 0401   CALCIUM 8.0 (L) 01/08/2017 0401   GFRNONAA >60 01/08/2017 0401   GFRAA >60 01/08/2017 0401     Assessment/Plan: 2 Days Post-Op   Principal Problem:   Osteomyelitis of right femur ([facility])   Advance diet Plan for ID consult Continue plan per Dr Lajoyce Cornersuda Continue wound vac   Torrie MayersUGLAS PARRY, BRANDON 01/08/2017, 1:29 PM  Discussed and agree with above.  Appreciate Dr. Lajoyce Cornersuda and Dr. Chaya JanSnyder's input.  I have spoken with ID.    Teryl LucyJoshua Skiler Olden, MD Cell [phone]

## 2017-01-09 ENCOUNTER — Inpatient Hospital Stay (HOSPITAL_COMMUNITY): Payer: Medicare Other

## 2017-01-09 DIAGNOSIS — M86251 Subacute osteomyelitis, right femur: Secondary | ICD-10-CM

## 2017-01-09 LAB — C-REACTIVE PROTEIN: CRP: 1.8 mg/dL — ABNORMAL HIGH (ref <1.0)

## 2017-01-09 LAB — CBC
HCT: 27.5 % — ABNORMAL LOW (ref 36.0–46.0)
HEMOGLOBIN: 8.1 g/dL — AB (ref 12.0–15.0)
MCH: 22 pg — AB (ref 26.0–34.0)
MCHC: 29.5 g/dL — AB (ref 30.0–36.0)
MCV: 74.5 fL — ABNORMAL LOW (ref 78.0–100.0)
Platelets: 596 10*3/uL — ABNORMAL HIGH (ref 150–400)
RBC: 3.69 MIL/uL — AB (ref 3.87–5.11)
RDW: 17.4 % — ABNORMAL HIGH (ref 11.5–15.5)
WBC: 5 10*3/uL (ref 4.0–10.5)

## 2017-01-09 LAB — ECHOCARDIOGRAM COMPLETE
HEIGHTINCHES: 61 in
WEIGHTICAEL: 1840 [oz_av]

## 2017-01-09 LAB — SEDIMENTATION RATE: SED RATE: 74 mm/h — AB (ref 0–22)

## 2017-01-09 LAB — VANCOMYCIN, TROUGH: VANCOMYCIN TR: 6 ug/mL — AB (ref 15–20)

## 2017-01-09 MED ORDER — VANCOMYCIN HCL IN DEXTROSE 750-5 MG/150ML-% IV SOLN
750.0000 mg | Freq: Three times a day (TID) | INTRAVENOUS | Status: DC
  Administered 2017-01-10 – 2017-01-15 (×16): 750 mg via INTRAVENOUS
  Filled 2017-01-09 (×18): qty 150

## 2017-01-09 MED ORDER — SODIUM CHLORIDE 0.9% FLUSH
10.0000 mL | INTRAVENOUS | Status: DC | PRN
  Administered 2017-01-09 – 2017-01-12 (×3): 10 mL
  Filled 2017-01-09 (×3): qty 40

## 2017-01-09 NOTE — Progress Notes (Signed)
Patient ID: Catha BrowLouise B Chilcote, female   DOB: 1976-05-23, 41 y.o.   MRN: [id number]     Subjective:  Patient reports pain as mild.  Patient very concerned about the remainder of her leg and is not sure to have an above knee amputation of do long term ABx.  Objective:   VITALS:   Vitals:   01/09/17 0229 01/09/17 0435 01/09/17 0630 01/09/17 0800  BP: 104/85  112/79   Pulse: 84  82   Resp: 19 18 17 16   Temp: 98.6 F (37 C)  98.5 F (36.9 C)   TempSrc:      SpO2: 97% 93% 95% 95%  Weight:      Height:        ABD soft Sensation intact distally Dorsiflexion/Plantar flexion intact Incision: dressing C/D/I and moderate drainage Wound vac in place  Lab Results  Component Value Date   WBC 5.0 01/09/2017   HGB 8.1 (L) 01/09/2017   HCT 27.5 (L) 01/09/2017   MCV 74.5 (L) 01/09/2017   PLT 596 (H) 01/09/2017   BMET    Component Value Date/Time   NA 135 01/08/2017 0401   K 3.5 01/08/2017 0401   CL 103 01/08/2017 0401   CO2 27 01/08/2017 0401   GLUCOSE 79 01/08/2017 0401   BUN <5 (L) 01/08/2017 0401   CREATININE 0.48 01/08/2017 0401   CALCIUM 8.0 (L) 01/08/2017 0401   GFRNONAA >60 01/08/2017 0401   GFRAA >60 01/08/2017 0401     Assessment/Plan: 3 Days Post-Op   Principal Problem:   Osteomyelitis of right femur (HCC)   Patient would like to discuss amputation with Dr Lajoyce Cornersuda again ID has recommended ABx  If the patient decides to go with non-op treatment Will defer to Dr Lajoyce Cornersuda for further amputation If patient is going to wait on further surgery Dr Dion SaucierLandau will make arrangements for wound closure and discharge   Haskel KhanDOUGLAS PARRY, BRANDON 01/09/2017, 12:49 PM  Discussed and agree with above.  Appreciate ID and Dr. Audrie Liauda's input.    Teryl LucyJoshua Hilman Kissling, MD Cell [phone]

## 2017-01-09 NOTE — Progress Notes (Signed)
Occupational Therapy Treatment Patient Details Name: Mary Long MRN: [id number] DOB: 1976-05-09 Today's Date: 01/09/2017    History of present illness 41 y.o.femalewho has a complicated history of osteomyelitis in the right lower extremity and has had multiple traumas and operations. Pt is now s/p R thigh removal of hardware, I&D and application of wound vac on 6/26. PMHx: Bipolar disorder, Blindness of L eye, Chronic osteomyelitis of R tibia, Depression, Hep C, Illicit drug use, Seizures.   OT comments  Focus of session determining transfer techniques for managing toilet and tub. Pt able to perform toilet transfer hopping to bathroom without use of AD and supervision for safety. Educated on safe tub transfer technique with boyfriend supervising as needed. D/c plan remains appropriate. Will continue to follow acutely.   Follow Up Recommendations  No OT follow up;Supervision - Intermittent    Equipment Recommendations  None recommended by OT    Recommendations for Other Services      Precautions / Restrictions Precautions Precautions: Fall Precaution Comments: R BKA, R LE wound vac Restrictions Weight Bearing Restrictions: No       Mobility Bed Mobility Overal bed mobility: Modified Independent                Transfers Overall transfer level: Needs assistance Equipment used: None Transfers: Sit to/from Stand Sit to Stand: Supervision         General transfer comment: for safety, no physical assist.    Balance Overall balance assessment: Needs assistance Sitting-balance support: No upper extremity supported;Feet supported Sitting balance-Leahy Scale: Normal     Standing balance support: No upper extremity supported;During functional activity Standing balance-Leahy Scale: Fair                             ADL either performed or assessed with clinical judgement   ADL Overall ADL's : Needs assistance/impaired                         Toilet Transfer: Supervision/safety;Comfort height toilet Toilet Transfer Details (indicate cue type and reason): Pt able to hop from bed to bathroom without use of AD; pt lightly holding onto wall/sink for balance       Tub/Shower Transfer Details (indicate cue type and reason): Discussed safe shower transfer technique. Pt has shower chair but it may be best for her to sit on the side of the tub to swing legs in; pt verbalized understanding and boyfriend available to assist as needed. Functional mobility during ADLs: Supervision/safety       Vision       Perception     Praxis      Cognition Arousal/Alertness: Awake/alert Behavior During Therapy: WFL for tasks assessed/performed Overall Cognitive Status: Within Functional Limits for tasks assessed                                          Exercises     Shoulder Instructions       General Comments      Pertinent Vitals/ Pain       Pain Assessment: Faces Faces Pain Scale: Hurts a little bit Pain Location: RLE Pain Descriptors / Indicators: Sore Pain Intervention(s): Monitored during session  Home Living  Prior Functioning/Environment              Frequency  Min 2X/week        Progress Toward Goals  OT Goals(current goals can now be found in the care plan section)  Progress towards OT goals: Progressing toward goals  Acute Rehab OT Goals Patient Stated Goal: return home OT Goal Formulation: With patient  Plan Discharge plan remains appropriate    Co-evaluation                 AM-PAC PT "6 Clicks" Daily Activity     Outcome Measure   Help from another person eating meals?: None Help from another person taking care of personal grooming?: None Help from another person toileting, which includes using toliet, bedpan, or urinal?: A Little Help from another person bathing (including washing, rinsing, drying)?: A  Little Help from another person to put on and taking off regular upper body clothing?: None Help from another person to put on and taking off regular lower body clothing?: A Little 6 Click Score: 21    End of Session    OT Visit Diagnosis: Unsteadiness on feet (R26.81);Other abnormalities of gait and mobility (R26.89);Pain Pain - Right/Left: Right Pain - part of body: Leg   Activity Tolerance Patient tolerated treatment well   Patient Left in bed;with call bell/phone within reach;with family/visitor present   Nurse Communication          Time: 0454-0981 OT Time Calculation (min): 10 min  Charges: OT General Charges $OT Visit: 1 Procedure OT Treatments $Self Care/Home Management : 8-22 mins  Shuayb Schepers A. Brett Albino, M.S., OTR/L Pager: [phone]   Gaye Alken 01/09/2017, 8:50 AM

## 2017-01-09 NOTE — Progress Notes (Signed)
Patient ID: Mary Long, female   DOB: 02/02/1976, 41 y.o.   MRN: [id number] Had a discussion with the patient regarding timing of her above-the-knee amputation on the right. Patient states that she wants to resume activities as soon as possible and would like to proceed with a above-the-knee amputation on the right as soon as possible. I will try to set this up for Monday. Plan for nothing by mouth after midnight Sunday night.

## 2017-01-09 NOTE — Progress Notes (Signed)
[facility]    Date of Admission:  01/06/2017   Total days of antibiotics 3        Day 3 vanco           ID: Mary Long is a 41 y.o. 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 Medical treatment  Medications:  . Chlorhexidine Gluconate Cloth  6 each Topical Q0600  . docusate sodium  100 mg Oral BID  . enoxaparin (LOVENOX) injection  40 mg Subcutaneous Q24H  . HYDROmorphone   Intravenous Q4H  . methadone  80 mg Oral Daily  . mupirocin ointment  1 application Nasal BID  . pneumococcal 23 valent vaccine  0.5 mL Intramuscular Tomorrow-1000  . senna  1 tablet Oral BID  . sodium chloride flush  10-40 mL Intracatheter Q12H    Objective: Vital signs in last 24 hours: Temp:  [98 F (36.7 C)-98.6 F (37 C)] 98.5 F (36.9 C) (06/29 0630) Pulse Rate:  [82-86] 82 (06/29 0630) Resp:  [16-22] 16 (06/29 0800) BP: (98-122)/(56-85) 112/79 (06/29 0630) SpO2:  [93 %-99 %] 95 % (06/29 0800) Physical Exam  Constitutional:  oriented to person, place, and time. appears well-developed and well-nourished. No distress.  HENT: [place]/AT, PERRLA, no scleral icterus Mouth/Throat: Oropharynx is clear and moist. No oropharyngeal exudate.  Neck = supple, no nuchal rigidity Ext: right BKA is wrapped, with wound vac  Skin: numerous scars to arms and left leg, no other open wounds Psychiatric: a normal mood and affect.  behavior is normal.    Lab Results  Recent Labs  01/07/17 0518 01/08/17 0401 01/09/17 0448  WBC 6.2 5.5 5.0  HGB 8.4* 8.1* 8.1*  HCT 28.0* 26.6* 27.5*  NA 139 135  --   K 3.0* 3.5  --   CL 103 103  --   CO2 28 27  --   BUN 8 <5*  --   CREATININE 0.55 0.48  --     Liver Panel No results for input(s): PROT, ALBUMIN, AST, ALT, ALKPHOS, BILITOT, BILIDIR, IBILI in the last 72 hours. Sedimentation Rate  Recent Labs  01/09/17 0448  ESRSEDRATE 74*   C-Reactive Protein  Recent Labs  01/09/17 0448  CRP 1.8*    Microbiology: From clinic - MRSA isolated for synovial fluid - vanco 1, clinda 0.25, tetra <1, cipro <8 bactrim R 6/26 synovial fluid + growth, awaiting ID Studies/Results: No results found.   Assessment/Plan: MRSA right knee septic arthritis plus femur osteomyelitis = I mentioned to patient that I could not give her odds/percentage that prolonged IV abtx of 8 wk plus chronic suppression would be successful and  ultimately she may end up getting AKA.  If her goal is to have quick recovery, I recommended to go do AKA. If she is interested in seeing if we could preserve her remaining BKA, then 8 wks of IV vanco plus prolonged suppression with oral doxy  Discussion with patient, her mother, and patient's Significant other. Mother reports patient being very adherent to IV treatment in the past  TTE is still pending for evaluation of endocarditis.    Pain management = currently on pca    Tejas Seawood  [facility] Cell: [phone] Pager: [phone]  01/09/2017, 1:24 PM

## 2017-01-09 NOTE — Progress Notes (Signed)
Pharmacy Antibiotic Note  Mary Long is a 41 y.o. female admitted on 01/06/2017 with ostemyelitis.  Pharmacy has been consulted for vancomycin dosing.  Abscess over distal medial femur was aspirated in orthopedic office and grew staph aureus. She is now s/p removal of thigh hardware and I&D. She was on Keflex and doxycycline PTA- 10 day course that was started on 6/22. She received vancomycin 1g IV x1 pre-op today at 1650.  Now planning AKA for Monday. Vancomycin level low this afternoon, will adjust.   Plan:. Increase to 750mg  IV q8 hours Follow c/s, clinical progression, surgical plans, renal function, likely need to recheck level this weekend   Height: 5\' 1"  (154.9 cm) Weight: 115 lb (52.2 kg) IBW/kg (Calculated) : 47.8  Temp (24hrs), Avg:98.5 F (36.9 C), Min:98 F (36.7 C), Max:98.8 F (37.1 C)   Recent Labs Lab 01/06/17 1145 01/06/17 2309 01/07/17 0518 01/08/17 0401 01/09/17 0448 01/09/17 1554  WBC 6.1 9.1 6.2 5.5 5.0  --   CREATININE 0.47  --  0.55 0.48  --   --   VANCOTROUGH  --   --   --   --   --  6*    Estimated Creatinine Clearance: 69.1 mL/min (by C-G formula based on SCr of 0.48 mg/dL).    Allergies  Allergen Reactions  . Tylenol [Acetaminophen] Nausea And Vomiting and Other (See Comments)    Dizziness & LIVER DISEASE    Antimicrobials this admission: vancomycin 6/26 >>   Dose adjustments this admission: N/a  Microbiology results: 6/26MRSA PCR - positive 6/26 right knee synovial, spec A  (+)rare SA 6/26 right knee synovial, spec B - 6/26 right knee tissue spec C   Thank you for allowing pharmacy to be a part of this patient's care.  Sheppard CoilFrank Troi Bechtold PharmD., BCPS Clinical Pharmacist Pager [phone] 01/09/2017 4:55 PM

## 2017-01-09 NOTE — Progress Notes (Addendum)
PT Cancellation Note  Patient Details Name: Mary Long MRN: [id number] DOB: 1976-02-18   Cancelled Treatment:    Reason Eval/Treat Not Completed: Patient at procedure or test/unavailable. Pt having cardiac US, will check back as time allows. Most likely tomorrow or Monday pending staffing. Pt had checked in earlier, pt was working on will and did not want to be disturbed.    Colin BroachSabra M. Arya Boxley PT, DPT  [phone]  01/09/2017, 3:49 PM

## 2017-01-10 LAB — HCV RNA QUANT
HCV QUANT: 354000 [IU]/mL (ref >50)
HCV Quantitative Log: 5.549 log10 IU/mL (ref >1.70)

## 2017-01-10 LAB — AEROBIC/ANAEROBIC CULTURE W GRAM STAIN (SURGICAL/DEEP WOUND)

## 2017-01-10 LAB — AEROBIC/ANAEROBIC CULTURE (SURGICAL/DEEP WOUND)

## 2017-01-11 LAB — VANCOMYCIN, TROUGH: Vancomycin Tr: 17 ug/mL (ref 15–20)

## 2017-01-11 LAB — AEROBIC/ANAEROBIC CULTURE W GRAM STAIN (SURGICAL/DEEP WOUND): Culture: NO GROWTH

## 2017-01-11 LAB — AEROBIC/ANAEROBIC CULTURE (SURGICAL/DEEP WOUND): CULTURE: NO GROWTH

## 2017-01-11 NOTE — Progress Notes (Signed)
Pharmacy Antibiotic Note  Mary Long is a 41 y.o. female admitted on 01/06/2017 with osteomyelitis.  Pharmacy has been consulted for Vancomycin dosing.  AKA planned for Monday.  Vancomycin trough within goal range following dose adjustment  Plan: Continue Vancomycin 750mg  IV q8 F/U plans for continuing antibiotics post-op  Height: 5\' 1"  (154.9 cm) Weight: 115 lb (52.2 kg) IBW/kg (Calculated) : 47.8  Temp (24hrs), Avg:98.2 F (36.8 C), Min:98 F (36.7 C), Max:98.4 F (36.9 C)   Recent Labs Lab 01/06/17 1145 01/06/17 2309 01/07/17 0518 01/08/17 0401 01/09/17 0448 01/09/17 1554 01/11/17 1239  WBC 6.1 9.1 6.2 5.5 5.0  --   --   CREATININE 0.47  --  0.55 0.48  --   --   --   VANCOTROUGH  --   --   --   --   --  6* 17    Estimated Creatinine Clearance: 69.1 mL/min (by C-G formula based on SCr of 0.48 mg/dL).    Allergies  Allergen Reactions  . Tylenol [Acetaminophen] Nausea And Vomiting and Other (See Comments)    Dizziness & LIVER DISEASE    Antimicrobials this admission: vancomycin 6/26 >>   Dose adjustments this admission: 6/29  VT 6- changed to 750mg  IV q8 7/1  VT 17  Microbiology results: 6/26MRSA PCR - positive 6/26 right knee synovial, spec A  (+)MRSA 6/26 right knee synovial, spec B ntd 6/26 right knee tissue spec C ntd  Thank you for allowing pharmacy to be a part of this patient's care.  Alvester MorinKendra Keyondre Hepburn, B.S., PharmD Clinical Pharmacist [facility]

## 2017-01-11 NOTE — Progress Notes (Signed)
Subjective: 5 Days Post-Op Procedure(s) (LRB): IRRIGATION AND DEBRIDEMENT RIGHT KNEE AND IRRIGATION AND DEBRIDEMENT BONE (Right) HARDWARE REMOVAL RIGHT KNEE (Right) APPLICATION OF WOUND VAC (Right) Patient reports pain as mild.    Objective: Vital signs in last 24 hours: Temp:  [98 F (36.7 C)-98.4 F (36.9 C)] 98.4 F (36.9 C) (07/01 0917) Pulse Rate:  [80-103] 82 (07/01 0917) Resp:  [12-18] 18 (07/01 1600) BP: (74-138)/(55-85) 103/63 (07/01 0917) SpO2:  [98 %-100 %] 98 % (07/01 1600)  Intake/Output from previous day: 06/30 0701 - 07/01 0700 In: 3717 [P.O.:1002; I.V.:2415; IV Piggyback:300] Out: -  Intake/Output this shift: Total I/O In: 490 [P.O.:480; I.V.:10] Out: -    Recent Labs  01/09/17 0448  HGB 8.1*    Recent Labs  01/09/17 0448  WBC 5.0  RBC 3.69*  HCT 27.5*  PLT 596*   No results for input(s): NA, K, CL, CO2, BUN, CREATININE, GLUCOSE, CALCIUM in the last 72 hours. No results for input(s): LABPT, INR in the last 72 hours.  Incision: dressing C/D/I  Assessment/Plan: 5 Days Post-Op Procedure(s) (LRB): IRRIGATION AND DEBRIDEMENT RIGHT KNEE AND IRRIGATION AND DEBRIDEMENT BONE (Right) HARDWARE REMOVAL RIGHT KNEE (Right) APPLICATION OF WOUND VAC (Right)  Pt still unclear of timing for surgical plan, I discussed with her per Dr. Audrie Liauda's note from 6/29 possible AKA for tomorrow will make her NPO after midnight tonight unless plan changes.   Margart SicklesChadwell, Montoya Watkin 01/11/2017, [phone]

## 2017-01-12 ENCOUNTER — Encounter (HOSPITAL_COMMUNITY)
Admission: RE | Disposition: A | Discharge status: Home / Self Care | Payer: Self-pay | Source: Ambulatory Visit | Attending: Orthopedic Surgery

## 2017-01-12 ENCOUNTER — Inpatient Hospital Stay (HOSPITAL_COMMUNITY): Payer: Medicare Other | Admitting: Anesthesiology

## 2017-01-12 ENCOUNTER — Encounter (HOSPITAL_COMMUNITY): Payer: Self-pay | Admitting: *Deleted

## 2017-01-12 DIAGNOSIS — M86251 Subacute osteomyelitis, right femur: Secondary | ICD-10-CM

## 2017-01-12 HISTORY — PX: AMPUTATION: SHX166

## 2017-01-12 SURGERY — AMPUTATION, ABOVE KNEE
Anesthesia: General | Laterality: Right

## 2017-01-12 MED ORDER — FENTANYL CITRATE (PF) 100 MCG/2ML IJ SOLN
INTRAMUSCULAR | Status: DC | PRN
  Administered 2017-01-12 (×5): 50 ug via INTRAVENOUS
  Administered 2017-01-12 (×2): 100 ug via INTRAVENOUS
  Administered 2017-01-12: 50 ug via INTRAVENOUS

## 2017-01-12 MED ORDER — HYDROMORPHONE HCL 1 MG/ML IJ SOLN
0.2500 mg | INTRAMUSCULAR | Status: DC | PRN
  Administered 2017-01-12 (×4): 0.5 mg via INTRAVENOUS

## 2017-01-12 MED ORDER — MAGNESIUM CITRATE PO SOLN: 1.0000 | Freq: Once | ORAL | Status: DC | PRN

## 2017-01-12 MED ORDER — ACETAMINOPHEN 325 MG PO TABS
650.0000 mg | ORAL_TABLET | Freq: Four times a day (QID) | ORAL | Status: DC | PRN
  Administered 2017-01-14: 650 mg via ORAL
  Filled 2017-01-12: qty 2

## 2017-01-12 MED ORDER — MIDAZOLAM HCL 2 MG/2ML IJ SOLN
INTRAMUSCULAR | Status: AC
  Administered 2017-01-12: 2 mg via INTRAVENOUS
  Filled 2017-01-12: qty 2

## 2017-01-12 MED ORDER — MIDAZOLAM HCL 2 MG/2ML IJ SOLN
INTRAMUSCULAR | Status: AC
  Filled 2017-01-12: qty 2

## 2017-01-12 MED ORDER — 0.9 % SODIUM CHLORIDE (POUR BTL) OPTIME
TOPICAL | Status: DC | PRN
  Administered 2017-01-12: 1000 mL

## 2017-01-12 MED ORDER — CHLORHEXIDINE GLUCONATE 4 % EX LIQD: 60.0000 mL | Freq: Once | CUTANEOUS | Status: DC

## 2017-01-12 MED ORDER — CEFAZOLIN SODIUM-DEXTROSE 1-4 GM/50ML-% IV SOLN
1.0000 g | Freq: Four times a day (QID) | INTRAVENOUS | Status: AC
  Administered 2017-01-12 – 2017-01-13 (×3): 1 g via INTRAVENOUS
  Filled 2017-01-12 (×4): qty 50

## 2017-01-12 MED ORDER — ONDANSETRON HCL 4 MG/2ML IJ SOLN: 4.0000 mg | Freq: Four times a day (QID) | INTRAMUSCULAR | Status: DC | PRN

## 2017-01-12 MED ORDER — CEFAZOLIN SODIUM-DEXTROSE 2-4 GM/100ML-% IV SOLN
INTRAVENOUS | Status: AC
  Filled 2017-01-12: qty 100

## 2017-01-12 MED ORDER — BISACODYL 10 MG RE SUPP: 10.0000 mg | Freq: Every day | RECTAL | Status: DC | PRN

## 2017-01-12 MED ORDER — KETAMINE HCL 10 MG/ML IJ SOLN
INTRAMUSCULAR | Status: DC | PRN
  Administered 2017-01-12 (×2): 50 mg via INTRAVENOUS

## 2017-01-12 MED ORDER — DOCUSATE SODIUM 100 MG PO CAPS
100.0000 mg | ORAL_CAPSULE | Freq: Two times a day (BID) | ORAL | Status: DC
  Administered 2017-01-12 – 2017-01-15 (×3): 100 mg via ORAL
  Filled 2017-01-12 (×6): qty 1

## 2017-01-12 MED ORDER — SODIUM CHLORIDE 0.9% FLUSH: 9.0000 mL | INTRAVENOUS | Status: DC | PRN

## 2017-01-12 MED ORDER — CEFAZOLIN SODIUM-DEXTROSE 2-4 GM/100ML-% IV SOLN
2.0000 g | INTRAVENOUS | Status: AC
  Administered 2017-01-12: 2 g via INTRAVENOUS

## 2017-01-12 MED ORDER — ONDANSETRON HCL 4 MG PO TABS: 4.0000 mg | ORAL_TABLET | Freq: Four times a day (QID) | ORAL | Status: DC | PRN

## 2017-01-12 MED ORDER — METOCLOPRAMIDE HCL 5 MG/ML IJ SOLN: 5.0000 mg | Freq: Three times a day (TID) | INTRAMUSCULAR | Status: DC | PRN

## 2017-01-12 MED ORDER — LIDOCAINE HCL (CARDIAC) 20 MG/ML IV SOLN
INTRAVENOUS | Status: DC | PRN
  Administered 2017-01-12: 100 mg via INTRAVENOUS

## 2017-01-12 MED ORDER — FENTANYL CITRATE (PF) 250 MCG/5ML IJ SOLN
INTRAMUSCULAR | Status: AC
  Filled 2017-01-12: qty 5

## 2017-01-12 MED ORDER — NICOTINE 14 MG/24HR TD PT24
14.0000 mg | MEDICATED_PATCH | Freq: Every day | TRANSDERMAL | Status: DC
  Filled 2017-01-12 (×4): qty 1

## 2017-01-12 MED ORDER — SODIUM CHLORIDE 0.9 % IV SOLN
INTRAVENOUS | Status: DC
  Administered 2017-01-14: 16:00:00 via INTRAVENOUS

## 2017-01-12 MED ORDER — DIPHENHYDRAMINE HCL 12.5 MG/5ML PO ELIX: 12.5000 mg | ORAL_SOLUTION | Freq: Four times a day (QID) | ORAL | Status: DC | PRN

## 2017-01-12 MED ORDER — PROPOFOL 10 MG/ML IV BOLUS
INTRAVENOUS | Status: DC | PRN
  Administered 2017-01-12: 200 mg via INTRAVENOUS

## 2017-01-12 MED ORDER — ACETAMINOPHEN 650 MG RE SUPP: 650.0000 mg | Freq: Four times a day (QID) | RECTAL | Status: DC | PRN

## 2017-01-12 MED ORDER — DIPHENHYDRAMINE HCL 50 MG/ML IJ SOLN: 12.5000 mg | Freq: Four times a day (QID) | INTRAMUSCULAR | Status: DC | PRN

## 2017-01-12 MED ORDER — PROMETHAZINE HCL 25 MG/ML IJ SOLN
INTRAMUSCULAR | Status: AC
  Filled 2017-01-12: qty 1

## 2017-01-12 MED ORDER — HYDROMORPHONE HCL 1 MG/ML IJ SOLN
1.0000 mg | INTRAMUSCULAR | Status: DC | PRN
Start: 2017-01-12 — End: 2017-01-15
  Administered 2017-01-12: 1 mg via INTRAVENOUS
  Filled 2017-01-12: qty 1

## 2017-01-12 MED ORDER — NALOXONE HCL 0.4 MG/ML IJ SOLN: 0.4000 mg | INTRAMUSCULAR | Status: DC | PRN

## 2017-01-12 MED ORDER — LACTATED RINGERS IV SOLN
INTRAVENOUS | Status: DC
  Administered 2017-01-12 (×2): via INTRAVENOUS

## 2017-01-12 MED ORDER — DEXMEDETOMIDINE HCL 200 MCG/2ML IV SOLN
INTRAVENOUS | Status: DC | PRN
Start: 2017-01-12 — End: 2017-01-12
  Administered 2017-01-12: 20 ug via INTRAVENOUS

## 2017-01-12 MED ORDER — ONDANSETRON HCL 4 MG/2ML IJ SOLN
4.0000 mg | Freq: Four times a day (QID) | INTRAMUSCULAR | Status: DC | PRN
Start: 2017-01-12 — End: 2017-01-15

## 2017-01-12 MED ORDER — METHOCARBAMOL 500 MG PO TABS
500.0000 mg | ORAL_TABLET | Freq: Four times a day (QID) | ORAL | Status: DC | PRN
  Administered 2017-01-13: 500 mg via ORAL
  Filled 2017-01-12: qty 1

## 2017-01-12 MED ORDER — METHOCARBAMOL 1000 MG/10ML IJ SOLN
500.0000 mg | Freq: Four times a day (QID) | INTRAVENOUS | Status: DC | PRN
  Administered 2017-01-13: 500 mg via INTRAVENOUS
  Filled 2017-01-12 (×6): qty 5

## 2017-01-12 MED ORDER — FENTANYL CITRATE (PF) 250 MCG/5ML IJ SOLN
INTRAMUSCULAR | Status: AC
  Filled 2017-01-12: qty 10

## 2017-01-12 MED ORDER — KETAMINE HCL-SODIUM CHLORIDE 100-0.9 MG/10ML-% IV SOSY
PREFILLED_SYRINGE | INTRAVENOUS | Status: AC
  Filled 2017-01-12: qty 10

## 2017-01-12 MED ORDER — HYDROMORPHONE HCL 1 MG/ML IJ SOLN
INTRAMUSCULAR | Status: AC
  Administered 2017-01-12: 0.5 mg via INTRAVENOUS
  Filled 2017-01-12: qty 1

## 2017-01-12 MED ORDER — MIDAZOLAM HCL 5 MG/5ML IJ SOLN
INTRAMUSCULAR | Status: DC | PRN
  Administered 2017-01-12: 2 mg via INTRAVENOUS

## 2017-01-12 MED ORDER — PROMETHAZINE HCL 25 MG/ML IJ SOLN
6.2500 mg | INTRAMUSCULAR | Status: DC | PRN
  Administered 2017-01-12: 6.25 mg via INTRAVENOUS

## 2017-01-12 MED ORDER — ONDANSETRON HCL 4 MG/2ML IJ SOLN
INTRAMUSCULAR | Status: DC | PRN
  Administered 2017-01-12: 4 mg via INTRAVENOUS

## 2017-01-12 MED ORDER — METOCLOPRAMIDE HCL 5 MG PO TABS
5.0000 mg | ORAL_TABLET | Freq: Three times a day (TID) | ORAL | Status: DC | PRN
Start: 2017-01-12 — End: 2017-01-15

## 2017-01-12 MED ORDER — PROPOFOL 10 MG/ML IV BOLUS
INTRAVENOUS | Status: AC
  Filled 2017-01-12: qty 20

## 2017-01-12 MED ORDER — MIDAZOLAM HCL 2 MG/2ML IJ SOLN
2.0000 mg | Freq: Once | INTRAMUSCULAR | Status: AC
  Administered 2017-01-12: 2 mg via INTRAVENOUS

## 2017-01-12 MED ORDER — POLYETHYLENE GLYCOL 3350 17 G PO PACK: 17.0000 g | PACK | Freq: Every day | ORAL | Status: DC | PRN

## 2017-01-12 MED ORDER — HYDROMORPHONE 1 MG/ML IV SOLN
INTRAVENOUS | Status: DC
  Administered 2017-01-12: 23:00:00 via INTRAVENOUS
  Administered 2017-01-13: 1 mg via INTRAVENOUS
  Administered 2017-01-13: 1.5 mg via INTRAVENOUS
  Administered 2017-01-13: 1 mg via INTRAVENOUS
  Administered 2017-01-13: 4.5 mg via INTRAVENOUS
  Administered 2017-01-13: 2 mg via INTRAVENOUS
  Administered 2017-01-14: 3 mg via INTRAVENOUS
  Administered 2017-01-14: 3.5 mg via INTRAVENOUS
  Administered 2017-01-14: 4 mg via INTRAVENOUS
  Administered 2017-01-14: 1 mg via INTRAVENOUS
  Administered 2017-01-14: 0.5 mg via INTRAVENOUS
  Administered 2017-01-14: 3.8 mg via INTRAVENOUS
  Administered 2017-01-14: 3 mg via INTRAVENOUS
  Administered 2017-01-15: 1.5 mg via INTRAVENOUS
  Filled 2017-01-12 (×2): qty 25

## 2017-01-12 MED ORDER — LIDOCAINE 2% (20 MG/ML) 5 ML SYRINGE
INTRAMUSCULAR | Status: AC
  Filled 2017-01-12: qty 5

## 2017-01-12 MED ORDER — OXYCODONE HCL 5 MG PO TABS
5.0000 mg | ORAL_TABLET | ORAL | Status: DC | PRN
  Administered 2017-01-12 – 2017-01-13 (×3): 10 mg via ORAL
  Filled 2017-01-12 (×3): qty 2

## 2017-01-12 SURGICAL SUPPLY — 45 items
BLADE SAW RECIP 87.9 MT (BLADE) ×3 IMPLANT
BNDG COHESIVE 6X5 TAN STRL LF (GAUZE/BANDAGES/DRESSINGS) ×6 IMPLANT
BNDG GAUZE ELAST 4 BULKY (GAUZE/BANDAGES/DRESSINGS) ×1 IMPLANT
CANISTER WOUND CARE 500ML ATS (WOUND CARE) ×2 IMPLANT
COVER SURGICAL LIGHT HANDLE (MISCELLANEOUS) ×6 IMPLANT
CUFF TOURNIQUET SINGLE 34IN LL (TOURNIQUET CUFF) IMPLANT
CUFF TOURNIQUET SINGLE 44IN (TOURNIQUET CUFF) IMPLANT
DRAIN PENROSE 1/2X12 LTX STRL (WOUND CARE) IMPLANT
DRAPE EXTREMITY TIBURON (DRAPES) ×2 IMPLANT
DRAPE HALF SHEET 40X57 (DRAPES) ×6 IMPLANT
DRAPE INCISE IOBAN 66X45 STRL (DRAPES) ×2 IMPLANT
DRAPE U-SHAPE 47X51 STRL (DRAPES) ×4 IMPLANT
DRSG ADAPTIC 3X8 NADH LF (GAUZE/BANDAGES/DRESSINGS) ×1 IMPLANT
DRSG PAD ABDOMINAL 8X10 ST (GAUZE/BANDAGES/DRESSINGS) ×2 IMPLANT
DURAPREP 26ML APPLICATOR (WOUND CARE) ×3 IMPLANT
ELECT CAUTERY BLADE 6.4 (BLADE) ×2 IMPLANT
ELECT REM PT RETURN 9FT ADLT (ELECTROSURGICAL) ×3
ELECTRODE REM PT RTRN 9FT ADLT (ELECTROSURGICAL) ×1 IMPLANT
EVACUATOR 1/8 PVC DRAIN (DRAIN) IMPLANT
GAUZE SPONGE 4X4 12PLY STRL (GAUZE/BANDAGES/DRESSINGS) ×1 IMPLANT
GLOVE BIOGEL PI IND STRL 9 (GLOVE) ×1 IMPLANT
GLOVE BIOGEL PI INDICATOR 9 (GLOVE) ×2
GLOVE SURG ORTHO 9.0 STRL STRW (GLOVE) ×3 IMPLANT
GOWN STRL REUS W/ TWL XL LVL3 (GOWN DISPOSABLE) ×2 IMPLANT
GOWN STRL REUS W/TWL XL LVL3 (GOWN DISPOSABLE) ×6
KIT BASIN OR (CUSTOM PROCEDURE TRAY) ×3 IMPLANT
KIT PREVENA INCISION MGT 13 (CANNISTER) ×2 IMPLANT
KIT ROOM TURNOVER OR (KITS) ×3 IMPLANT
MANIFOLD NEPTUNE II (INSTRUMENTS) ×3 IMPLANT
NS IRRIG 1000ML POUR BTL (IV SOLUTION) ×3 IMPLANT
PACK GENERAL/GYN (CUSTOM PROCEDURE TRAY) ×3 IMPLANT
PAD ARMBOARD 7.5X6 YLW CONV (MISCELLANEOUS) ×3 IMPLANT
STAPLER VISISTAT 35W (STAPLE) ×2 IMPLANT
STOCKINETTE IMPERVIOUS LG (DRAPES) IMPLANT
SUT ETHILON 2 0 PSLX (SUTURE) ×3 IMPLANT
SUT PDS AB 1 CT  36 (SUTURE)
SUT PDS AB 1 CT 36 (SUTURE) IMPLANT
SUT SILK 2 0 (SUTURE) ×3
SUT SILK 2-0 18XBRD TIE 12 (SUTURE) ×1 IMPLANT
SUT VIC AB 1 CTX 27 (SUTURE) ×3 IMPLANT
SWAB COLLECTION DEVICE MRSA (MISCELLANEOUS) IMPLANT
SWAB CULTURE ESWAB REG 1ML (MISCELLANEOUS) IMPLANT
TOWEL OR 17X24 6PK STRL BLUE (TOWEL DISPOSABLE) ×3 IMPLANT
TOWEL OR 17X26 10 PK STRL BLUE (TOWEL DISPOSABLE) ×3 IMPLANT
WATER STERILE IRR 1000ML POUR (IV SOLUTION) ×3 IMPLANT

## 2017-01-12 NOTE — Progress Notes (Signed)
Dilaudid 10ml wasted in sink prior to transport to OR, as witnessed by Aubery LappingJoan K. RN.

## 2017-01-12 NOTE — Progress Notes (Signed)
PT Cancellation Note  Patient Details Name: Mary Long MRN: [id number] DOB: 03/24/1976   Cancelled Treatment:    Reason Eval/Treat Not Completed: Patient at procedure or test/unavailable. Pt going for AKA today. PT to return s/p surgery as able and appropriate.   Darric Plante M Myron Lona 01/12/2017, 1:42 PM   Lewis ShockAshly Nelson Julson, PT, DPT Pager #: [phone] Office #: [phone]

## 2017-01-12 NOTE — Progress Notes (Signed)
Transported to OR for Sx- BKA.

## 2017-01-12 NOTE — Anesthesia Procedure Notes (Signed)
Procedure Name: LMA Insertion Date/Time: 01/12/2017 4:51 PM Performed by: Rosiland OzMEYERS, Fabrizzio Marcella Pre-anesthesia Checklist: Emergency Drugs available, Patient identified, Suction available, Patient being monitored and Timeout performed Patient Re-evaluated:Patient Re-evaluated prior to inductionOxygen Delivery Method: Circle system utilized Preoxygenation: Pre-oxygenation with 100% oxygen Intubation Type: IV induction LMA: LMA inserted LMA Size: 4.0 Number of attempts: 1 Placement Confirmation: positive ETCO2 and breath sounds checked- equal and bilateral Tube secured with: Tape Dental Injury: Teeth and Oropharynx as per pre-operative assessment

## 2017-01-12 NOTE — Progress Notes (Signed)
Received pt s/p post R  BKA,with  woundvac on, asleep. V/s checked ( see flowsheet).

## 2017-01-12 NOTE — Progress Notes (Signed)
PCA to be restarted on floor- pt was on it pre-op

## 2017-01-12 NOTE — Transfer of Care (Signed)
Immediate Anesthesia Transfer of Care Note  Patient: Mary Long  Procedure(s) Performed: Procedure(s): RIGHT ABOVE KNEE AMPUTATION (Right)  Patient Location: PACU  Anesthesia Type:General  Level of Consciousness: sedated and patient cooperative  Airway & Oxygen Therapy: Patient Spontanous Breathing  Post-op Assessment: Report given to RN and Post -op Vital signs reviewed and stable  Post vital signs: Reviewed and stable  Last Vitals:  Vitals:   01/12/17 0800 01/12/17 1415  BP:  101/61  Pulse:  91  Resp: 16 16  Temp:  37.1 C    Last Pain:  Vitals:   01/12/17 1415  TempSrc: Oral  PainSc:       Patients Stated Pain Goal: 2 (01/09/17 0435)  Complications: No apparent anesthesia complications

## 2017-01-12 NOTE — Progress Notes (Signed)
Patient ID: [name], female   DOB: [birth year], 41 y.o.   MRN: [id number]          [facility]    Date of Admission:  01/06/2017           Day 7 vancomycin  Mary Long MRSA septic arthritis of right knee and osteomyelitis of her femur. [name] elected to undergo AKA today. She may still require a long course of IV vancomycin depending on the condition of her femoral stump following surgery.         Cliffton AstersJohn Shequilla Goodgame, MD [facility] [phone] pager   [phone] cell 01/12/2017, 8:36 AM

## 2017-01-12 NOTE — Progress Notes (Signed)
Dr Okey Dupreose in to assess pt - pt continues to yell, scream & curse about level of pain- versed ordered for level of agitation

## 2017-01-12 NOTE — Op Note (Signed)
01/06/2017 - 01/12/2017  5:42 PM  PATIENT:  Mary Long    PRE-OPERATIVE DIAGNOSIS:  Osteomyelitis Right Femur  POST-OPERATIVE DIAGNOSIS:  Same  PROCEDURE:  RIGHT ABOVE KNEE AMPUTATION Application of Prevena wound VAC  SURGEON:  Nadara MustardMarcus V Osha Rane, MD  PHYSICIAN ASSISTANT:None ANESTHESIA:   General  PREOPERATIVE INDICATIONS:  Mary BrowLouise B Altland is a  41 y.o. female with a diagnosis of Osteomyelitis Right Femur who failed conservative measures and elected for surgical management.    The risks benefits and alternatives were discussed with the patient preoperatively including but not limited to the risks of infection, bleeding, nerve injury, cardiopulmonary complications, the need for revision surgery, among others, and the patient was willing to proceed.  OPERATIVE IMPLANTS: Prevena wound VAC  OPERATIVE FINDINGS: No signs of abscess or infection at the level of amputation. The muscle was intact healthy and viable. No signs of bony infection.  OPERATIVE PROCEDURE: Patient was brought to the operating room and underwent a general anesthetic. After adequate levels anesthesia were obtained patient's right lower extremity was prepped using DuraPrep draped into a sterile field a timeout was called. A fishmouth incision was made with a larger posterior flap in order to incorporate the medial lateral incisions with the soft tissue amputation. Using electrocautery this was carried down to bone. The medial vessels were clamped with a hemostat and suture ligated with 2-0 silk. The femur was amputated with a reciprocating saw. The leg was delivered off the field. Electrocautery was used for hemostasis. The muscle was contractile had good color and good consistency. There is no evidence of infection at the bone or infection in the muscle. The deep and superficial fascial layers and skin was closed using 2-0 nylon. A Prevena wound VAC was applied this had a good suction fit. Patient was extubated taken to the  PACU in stable condition.

## 2017-01-12 NOTE — Progress Notes (Signed)
Patient ID: Mary Long, female   DOB: 1976/05/17, 41 y.o.   MRN: [id number] Discussed surgical and nonsurgical options with the patient this morning. Patient states that she would like to proceed with an above-the-knee amputation on the right. We will plan for surgery around 5 PM. Patient will need to be nothing by mouth after 8 AM.

## 2017-01-12 NOTE — Anesthesia Preprocedure Evaluation (Signed)
Anesthesia Evaluation  Patient identified by MRN, date of birth, ID band Patient awake    Reviewed: Allergy & Precautions, NPO status , Patient's Chart, lab work & pertinent test results  Airway Mallampati: II  TM Distance: >3 FB Neck ROM: Full    Dental no notable dental hx.    Pulmonary Current Smoker,    Pulmonary exam normal breath sounds clear to auscultation       Cardiovascular Normal cardiovascular exam Rhythm:Regular Rate:Normal     Neuro/Psych Bipolar Disorder negative neurological ROS     GI/Hepatic negative GI ROS, (+)     substance abuse  , Hepatitis -, C  Endo/Other  negative endocrine ROS  Renal/GU negative Renal ROS  negative genitourinary   Musculoskeletal negative musculoskeletal ROS (+)   Abdominal   Peds negative pediatric ROS (+)  Hematology negative hematology ROS (+)   Anesthesia Other Findings   Reproductive/Obstetrics negative OB ROS                             Anesthesia Physical Anesthesia Plan  ASA: III  Anesthesia Plan: General   Post-op Pain Management:    Induction: Intravenous  PONV Risk Score and Plan:   Airway Management Planned: LMA and Oral ETT  Additional Equipment:   Intra-op Plan:   Post-operative Plan: Extubation in OR  Informed Consent: I have reviewed the patients History and Physical, chart, labs and discussed the procedure including the risks, benefits and alternatives for the proposed anesthesia with the patient or authorized representative who has indicated his/her understanding and acceptance.   Dental advisory given  Plan Discussed with: CRNA and Surgeon  Anesthesia Plan Comments:         Anesthesia Quick Evaluation

## 2017-01-13 ENCOUNTER — Encounter (HOSPITAL_COMMUNITY): Payer: Self-pay | Admitting: Orthopedic Surgery

## 2017-01-13 DIAGNOSIS — Z89511 Acquired absence of right leg below knee: Secondary | ICD-10-CM

## 2017-01-13 DIAGNOSIS — M8600 Acute hematogenous osteomyelitis, unspecified site: Secondary | ICD-10-CM

## 2017-01-13 DIAGNOSIS — A4902 Methicillin resistant Staphylococcus aureus infection, unspecified site: Secondary | ICD-10-CM

## 2017-01-13 DIAGNOSIS — M00061 Staphylococcal arthritis, right knee: Secondary | ICD-10-CM

## 2017-01-13 LAB — BASIC METABOLIC PANEL
Anion gap: 7 (ref 5–15)
BUN: <5 mg/dL — ABNORMAL LOW (ref 6–20)
CHLORIDE: 99 mmol/L — AB (ref 101–111)
CO2: 28 mmol/L (ref 22–32)
CREATININE: 0.72 mg/dL (ref 0.44–1.00)
Calcium: 8.2 mg/dL — ABNORMAL LOW (ref 8.9–10.3)
Glucose, Bld: 119 mg/dL — ABNORMAL HIGH (ref 65–99)
Potassium: 3.6 mmol/L (ref 3.5–5.1)
SODIUM: 134 mmol/L — AB (ref 135–145)

## 2017-01-13 MED ORDER — SODIUM CHLORIDE 0.9 % IV BOLUS (SEPSIS): 250.0000 mL | Freq: Once | INTRAVENOUS | Status: DC

## 2017-01-13 NOTE — Progress Notes (Signed)
Patient started on PCA dilaudid but refused to wear ETCO2 tubing and pulse oximeter. Educated patient and husband but continued to refuse and pt becomed vebally aggressive to staff.

## 2017-01-13 NOTE — Progress Notes (Signed)
Patient ID: Mary Long, female   DOB: October 14, 1975, 41 y.o.   MRN: [id number] Postoperative day 1 right above-the-knee amputation. There was no sign of infection at the level of amputation the bone was healthy viable no signs of osteomyelitis the muscle had good color and contractility. Patient may discharge to home from an orthopedic standpoint once she is safe with therapy. If the patient is having difficulty with therapy she may need to go to skilled nursing placement short-term.

## 2017-01-13 NOTE — Anesthesia Postprocedure Evaluation (Signed)
Anesthesia Post Note  Patient: Mary Long  Procedure(s) Performed: Procedure(s) (LRB): RIGHT ABOVE KNEE AMPUTATION (Right)     Patient location during evaluation: PACU Anesthesia Type: General Level of consciousness: awake and alert Pain management: pain level controlled Vital Signs Assessment: post-procedure vital signs reviewed and stable Respiratory status: spontaneous breathing, nonlabored ventilation, respiratory function stable and patient connected to nasal cannula oxygen Cardiovascular status: blood pressure returned to baseline and stable Postop Assessment: no signs of nausea or vomiting Anesthetic complications: no    Last Vitals:  Vitals:   01/13/17 1453 01/13/17 1756  BP: 103/70   Pulse: 91   Resp: 19 18  Temp: 37.5 C     Last Pain:  Vitals:   01/13/17 1756  TempSrc:   PainSc: 7                  [name]

## 2017-01-13 NOTE — Progress Notes (Signed)
Patient ID: [name], female   DOB: 11-24-1975, 41 y.o.   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 She underwent AKA yesterday and Dr. Lajoyce Cornersuda noted that there was no sign of infection at the level of amputation. I will plan on continuing a short course of IV vancomycin while she is here. It can be discontinued upon discharge. She is asking for help finding a primary care provider who accepts Medicare.         Cliffton AstersJohn Shakora Nordquist, MD [facility] [phone] pager   [phone] cell 01/13/2017, 12:18 PM

## 2017-01-13 NOTE — Evaluation (Addendum)
Physical Therapy Re-Evaluation Patient Details Name: Mary Long MRN: [id number] DOB: 1976/05/14 Today's Date: 01/13/2017   History of Present Illness  Pt is a 41 y.o. 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 PMHx: Bipolar disorder, Blindness of L eye, Chronic osteomyelitis R tibia, Depression, Hep C, ILlicit drug use, Seizures.  Clinical Impression  Patient presents with decreased mobility due to pain and decreased safety awareness.  She will benefit from skilled PT in the acute setting to allow return home with family support.  Feel she would benefit from HHPT if she is agreeable.    Follow Up Recommendations Home health PT    Equipment Recommendations  Wheelchair (measurements PT);Wheelchair cushion (measurements PT) (if pt agreeable)    Recommendations for Other Services       Precautions / Restrictions Precautions Precautions: Fall Precaution Comments: R AKA, RLE wound vac Restrictions Weight Bearing Restrictions: Yes RLE Weight Bearing: Non weight bearing      Mobility  Bed Mobility Overal bed mobility: Needs Assistance Bed Mobility: Supine to Sit;Sit to Supine     Supine to sit: Supervision;HOB elevated Sit to supine: Supervision;HOB elevated   General bed mobility comments: supervision for safety due to agitation and impulsivity  Transfers Overall transfer level: Needs assistance Equipment used: None Transfers: Sit to/from Stand Sit to Stand: Min guard   Squat pivot transfers: Min assist;Min guard     General transfer comment: assist for safety  Ambulation/Gait Ambulation/Gait assistance: Min guard Ambulation Distance (Feet): 12 Feet Assistive device: Rolling walker (2 wheeled) Gait Pattern/deviations: Step-to pattern;Trunk flexed     General Gait Details: minguard for safety, assist for IV, VAC, pt cursing and frustrated with having to walk when in pain  Stairs            Wheelchair Mobility    Modified  Rankin (Stroke Patients Only)       Balance Overall balance assessment: Needs assistance Sitting-balance support: Bilateral upper extremity supported Sitting balance-Leahy Scale: Good     Standing balance support: Bilateral upper extremity supported Standing balance-Leahy Scale: Poor Standing balance comment: UE support on walker                             Pertinent Vitals/Pain Pain Assessment: 0-10 Pain Score: 10-Worst pain ever Faces Pain Scale: Hurts whole lot Pain Location: RLE Pain Descriptors / Indicators: Aching;Crying;Sharp;Sore Pain Intervention(s): Limited activity within patient's tolerance;Monitored during session;Repositioned    Home Living Family/patient expects to be discharged to:: Private residence Living Arrangements: Spouse/significant other;Parent Available Help at Discharge: Family;Available 24 hours/day Type of Home: Apartment (condo) Home Access: Level entry     Home Layout: One level Home Equipment: Walker - 2 wheels;Cane - single point;Bedside commode;Tub bench      Prior Function           Comments: Ambulating with R BKA prosthesis and no AD up until admission     Hand Dominance   Dominant Hand: Right    Extremity/Trunk Assessment   Upper Extremity Assessment Upper Extremity Assessment: Defer to OT evaluation    Lower Extremity Assessment Lower Extremity Assessment: RLE deficits/detail RLE Deficits / Details: moves for mobility, but very painful, does not want to visualize her amputation as reports not yet come to terms with her situation       Communication   Communication: No difficulties  Cognition Arousal/Alertness: Awake/alert Behavior During Therapy: Agitated;Anxious;Impulsive Overall Cognitive Status: Within Functional  Limits for tasks assessed                                 General Comments: Pt tearful and agitated throughout; cussing at therapist and reporting having a difficult time  coping with BKA>AKA.      General Comments General comments (skin integrity, edema, etc.): needed encouragement to walk around bed, very frustrated with being rushed to do anything due to pain    Exercises     Assessment/Plan    PT Assessment Patient needs continued PT services  PT Problem List Decreased activity tolerance;Pain;Decreased mobility;Decreased safety awareness;Decreased knowledge of use of DME       PT Treatment Interventions Gait training;DME instruction;Therapeutic activities;Therapeutic exercise;Patient/family education;Functional mobility training;Balance training    PT Goals (Current goals can be found in the Care Plan section)  Acute Rehab PT Goals Patient Stated Goal: decrease pain PT Goal Formulation: With patient Time For Goal Achievement: 01/20/17 Potential to Achieve Goals: Good    Frequency Min 3X/week   Barriers to discharge        Co-evaluation               AM-PAC PT "6 Clicks" Daily Activity  Outcome Measure Difficulty turning over in bed (including adjusting bedclothes, sheets and blankets)?: None Difficulty moving from lying on back to sitting on the side of the bed? : None Difficulty sitting down on and standing up from a chair with arms (e.g., wheelchair, bedside commode, etc,.)?: Total Help needed moving to and from a bed to chair (including a wheelchair)?: A Little Help needed walking in hospital room?: A Little Help needed climbing 3-5 steps with a railing? : A Lot 6 Click Score: 17    End of Session   Activity Tolerance: Patient limited by pain Patient left: in bed;with call bell/phone within reach   PT Visit Diagnosis: Difficulty in walking, not elsewhere classified (R26.2);Pain Pain - Right/Left: Right Pain - part of body: Leg    Time: 1345-1400 PT Time Calculation (min) (ACUTE ONLY): 15 min   Charges:   PT Evaluation $PT Re-evaluation: 1 Procedure     PT G CodesSheran Lawless:        Cyndi Adelena Desantiago,  PT [phone] 01/13/2017   Elray Mcgregorynthia Johngabriel Verde 01/13/2017, 2:21 PM

## 2017-01-13 NOTE — Clinical Social Work Note (Signed)
CSW consulted for "Skilled nursing facility placement." P/T recommending HHPT and O/T recommending no f/u. CSW informed pt is IV drug user. However, per infectious disease note, IV ABX can be discontinued upon discharge.   CSW signing off as no further Social Work needs identified. Please reconsult if new Social Work needs arise.   Corlis HoveJeneya Qusai Kem, LCSWA, LCASA Clinical Social Work [phone]

## 2017-01-13 NOTE — Progress Notes (Addendum)
Occupational Therapy Treatment Patient Details Name: Mary Long MRN: [id number] DOB: 11-13-1975 Today's Date: 01/13/2017    History of present illness Pt is a 41 y.o. 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 PMHx: Bipolar disorder, Blindness of L eye, Chronic osteomyelitis R tibia, Depression, Hep C, ILlicit drug use, Seizures.   OT comments  Pt appears to be having a difficult time adjusting to R AKA revision; she is tearful, agitated, and impulsive throughout session. She was able to perform squat pivot from EOB <> BSC with min-min guard assist. Anticipate once pain is controlled and pt has time to cope with new R AKA her mobility and participation in ADL will improve. Pending progress pt may need follow up therapy at home or in a post acute rehab setting. Will continue to follow acutely for progress to goals and d/c planning.    Follow Up Recommendations  No OT follow up;Supervision - Intermittent    Equipment Recommendations  None recommended by OT    Recommendations for Other Services      Precautions / Restrictions Precautions Precautions: Fall Precaution Comments: R AKA, RLE wound vac Restrictions Weight Bearing Restrictions: Yes RLE Weight Bearing: Non weight bearing       Mobility Bed Mobility Overal bed mobility: Needs Assistance Bed Mobility: Supine to Sit;Sit to Supine     Supine to sit: Supervision;HOB elevated Sit to supine: Supervision;HOB elevated   General bed mobility comments: supervision for safety due to agitation and impulsivity  Transfers Overall transfer level: Needs assistance Equipment used: None Transfers: Squat Pivot Transfers     Squat pivot transfers: Min assist;Min guard     General transfer comment: Min assist EOB>BSC, min guard for squat pivot return to bed    Balance Overall balance assessment: Needs assistance Sitting-balance support: Bilateral upper extremity supported Sitting balance-Leahy  Scale: Good     Standing balance support: Bilateral upper extremity supported;During functional activity Standing balance-Leahy Scale: Fair                             ADL either performed or assessed with clinical judgement   ADL Overall ADL's : Needs assistance/impaired                     Lower Body Dressing: Set up;Supervision/safety Lower Body Dressing Details (indicate cue type and reason): to don slipper to LLE Toilet Transfer: Minimal assistance;Min guard;Squat-pivot;BSC Toilet Transfer Details (indicate cue type and reason): Min assist EOB>BSC. Min guard BSC>EOB. Toileting- Clothing Manipulation and Hygiene: Set up;Supervision/safety;Sitting/lateral lean       Functional mobility during ADLs: Min guard;Minimal assistance (for squat pivot only)       Vision       Perception     Praxis      Cognition Arousal/Alertness: Awake/alert Behavior During Therapy: Agitated;Anxious;Impulsive Overall Cognitive Status: Within Functional Limits for tasks assessed                                 General Comments: Pt tearful and agitated throughout; cussing at therapist and reporting having a difficult time coping with BKA>AKA.        Exercises     Shoulder Instructions       General Comments      Pertinent Vitals/ Pain       Pain Assessment: 0-10 Pain Score: 10-Worst  pain ever Pain Location: RLE Pain Descriptors / Indicators: Aching;Crying;Sharp;Sore Pain Intervention(s): Limited activity within patient's tolerance;Monitored during session;PCA encouraged  Home Living Family/patient expects to be discharged to:: Private residence Living Arrangements: Spouse/significant other;Parent Available Help at Discharge: Family;Available 24 hours/day Type of Home: Apartment (condo) Home Access: Level entry     Home Layout: One level     Bathroom Shower/Tub: Chief Strategy OfficerTub/shower unit   Bathroom Toilet: Standard     Home Equipment: Environmental consultantWalker - 2  wheels;Cane - single point;Bedside commode;Tub bench          Prior Functioning/Environment          Comments: Ambulating with R BKA prosthesis and no AD up until admission   Frequency  Min 2X/week        Progress Toward Goals  OT Goals(current goals can now be found in the care plan section)  Progress towards OT goals: Not progressing toward goals - comment (limited by pain and agitation this session)  Acute Rehab OT Goals Patient Stated Goal: decrease pain OT Goal Formulation: With patient  Plan Discharge plan remains appropriate    Co-evaluation                 AM-PAC PT "6 Clicks" Daily Activity     Outcome Measure   Help from another person eating meals?: None Help from another person taking care of personal grooming?: A Little Help from another person toileting, which includes using toliet, bedpan, or urinal?: A Little Help from another person bathing (including washing, rinsing, drying)?: A Little Help from another person to put on and taking off regular upper body clothing?: None Help from another person to put on and taking off regular lower body clothing?: A Little 6 Click Score: 20    End of Session    OT Visit Diagnosis: Unsteadiness on feet (R26.81);Other abnormalities of gait and mobility (R26.89);Pain Pain - Right/Left: Right Pain - part of body: Leg   Activity Tolerance Patient limited by pain   Patient Left in bed;with call bell/phone within reach   Nurse Communication Mobility status        Time: [id number] OT Time Calculation (min): 16 min  Charges: OT General Charges $OT Visit: 1 Procedure OT Treatments $Self Care/Home Management : 8-22 mins  Dontel Harshberger A. Brett Albinooffey, M.S., OTR/L Pager: [phone]   Gaye AlkenBailey A Aamya Orellana 01/13/2017, 11:54 AM

## 2017-01-14 NOTE — Progress Notes (Signed)
Patient ID: [name], female   DOB: Jun 28, 1976, 41 y.o.   MRN: [id number]         [facility]    Date of Admission:  01/06/2017           Day 9 vancomycin        Postop day 2  Ms. 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 She underwent AKA 2 days ago and there was no sign of infection at the level of amputation. I would stop IV vancomycin upon discharge or after 5 postop days, whichever comes first. I will sign off now.        Cliffton AstersJohn Zamyia Gowell, MD [facility] [phone] pager   [phone] cell 01/14/2017, 2:54 PM

## 2017-01-14 NOTE — Progress Notes (Signed)
PT Cancellation Note  Patient Details Name: Mary Long MRN: [id number] DOB: 09-Sep-1975   Cancelled Treatment:    Reason Eval/Treat Not Completed: (P) Patient declined, no reason specified (Pt sleeping on arrival with boyfriend asleep at bedside.  Pt refused therapy despite education and encouragement.  Pt fell back asleep after edcation and refused to speak to PTA.  )   Valerian Jewel Artis DelayJ Rodrigues Urbanek 01/14/2017, 4:49 PM Joycelyn RuaAimee Angee Gupton, PTA pager [phone]

## 2017-01-14 NOTE — Progress Notes (Signed)
Pharmacy Antibiotic Note  Catha BrowLouise B Cu is a 41 y.o. female admitted on 01/06/2017 with osteomyelitis.  Pharmacy has been consulted for Vancomycin dosing.  AKA planned for Monday.  Day #6 of abx for MRSA right thigh osteo/abscess. S/p I&D with hardware removal and wound VAC 6/26.  Afebrile, WBC wnl. Hx BKA in 2015, now s/p AKA on 7/2. Op note states bone and muscle looked good at amputation site. ID recommends continuing vancomycin until discharge.  Plan: Continue vancomycin 750mg  IV Q8h until discharge Monitor clinical picture, renal function, VT prn  Height: 5\' 1"  (154.9 cm) Weight: 115 lb (52.2 kg) IBW/kg (Calculated) : 47.8  Temp (24hrs), Avg:98.6 F (37 C), Min:98.1 F (36.7 C), Max:99.5 F (37.5 C)   Recent Labs Lab 01/08/17 0401 01/09/17 0448 01/09/17 1554 01/11/17 1239 01/13/17 0508  WBC 5.5 5.0  --   --   --   CREATININE 0.48  --   --   --  0.72  VANCOTROUGH  --   --  6* 17  --     Estimated Creatinine Clearance: 69.1 mL/min (by C-G formula based on SCr of 0.72 mg/dL).    Allergies  Allergen Reactions  . Tylenol [Acetaminophen] Nausea And Vomiting and Other (See Comments)    Dizziness & LIVER DISEASE    Antimicrobials this admission: Vancomycin 6/26 >>   Dose adjustments this admission: 6/29  VT 6- changed to 750mg  IV q8 7/1  VT 17  Microbiology results: 6/26MRSA PCR - positive 6/26 right knee synovial, spec A  (+)MRSA 6/26 right knee synovial, spec B ntd 6/26 right knee tissue spec C ntd  Thank you for allowing pharmacy to be a part of this patient's care.  Enzo BiNathan Dondi Burandt, PharmD, BCPS Clinical Pharmacist Pager [phone] 01/14/2017 12:36 PM

## 2017-01-15 ENCOUNTER — Telehealth (INDEPENDENT_AMBULATORY_CARE_PROVIDER_SITE_OTHER): Payer: Self-pay | Admitting: Orthopedic Surgery

## 2017-01-15 LAB — CREATININE, SERUM
Creatinine, Ser: 0.58 mg/dL (ref 0.44–1.00)
GFR calc Af Amer: >60 mL/min (ref >60)

## 2017-01-15 LAB — GLUCOSE, CAPILLARY: GLUCOSE-CAPILLARY: 99 mg/dL (ref 65–99)

## 2017-01-15 MED ORDER — OXYCODONE-ACETAMINOPHEN 5-325 MG PO TABS: 1.0000 | ORAL_TABLET | ORAL | 0 refills | Status: DC | PRN

## 2017-01-15 NOTE — Progress Notes (Signed)
Physical Therapy Treatment Patient Details Name: Mary Long MRN: [id number] DOB: 1976/04/18 Today's Date: 01/15/2017    History of Present Illness Pt is a 41 y.o. 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 PMHx: Bipolar disorder, Blindness of L eye, Chronic osteomyelitis R tibia, Depression, Hep C, ILlicit drug use, Seizures.    PT Comments    Upon arrival pt states she is "glad to see physical therapy;" however once in room pt and boyfriend vocalize frustration with trying to get the patient to mobilize when she is in pain, yet the patient is agreeable to mobilize. The patient gets out of her recliner chair impulsively and refuses the use of a gait belt. Pt ambulates around the room several times before sitting back down in the chair. The pt states she is frustrated with going home so soon after her amputation and says her and her boyfriend will take care of everything when they get home; pt is resistant to further assistance. PT attempted to see if pt would try stairs; however, pt refused. Pt was educated on how to safely navigate stairs at home. Pt would benefit from continued acute therapy for strengthening and mobilization for safe d/c.    Follow Up Recommendations  Outpatient PT     Equipment Recommendations  Wheelchair (measurements PT);Wheelchair cushion (measurements PT)    Recommendations for Other Services       Precautions / Restrictions Precautions Precautions: Fall Precaution Comments: R AKA, RLE wound vac Restrictions Weight Bearing Restrictions: Yes RLE Weight Bearing: Non weight bearing    Mobility  Bed Mobility                  Transfers Overall transfer level: Needs assistance   Transfers: Sit to/from Stand Sit to Stand: Min guard         General transfer comment: Min guard for safety; pt with decreased safety awareness   Ambulation/Gait Ambulation/Gait assistance: Min guard Ambulation Distance (Feet): 40  Feet Assistive device:  (Rollator) Gait Pattern/deviations: Step-to pattern;Trunk flexed Gait velocity: slow but impulsive   General Gait Details: minguard for safety, assist for IV, VAC, pt frustrated with having to walk when in pain   Stairs            Wheelchair Mobility    Modified Rankin (Stroke Patients Only)       Balance Overall balance assessment: No apparent balance deficits (not formally assessed)                                          Cognition Arousal/Alertness: Awake/alert Behavior During Therapy: Agitated;Anxious;Impulsive;Flat affect Overall Cognitive Status: Within Functional Limits for tasks assessed                                 General Comments: Pt agitated throughout; reported frustration with being in the hospital      Exercises      General Comments        Pertinent Vitals/Pain Pain Assessment: Faces Faces Pain Scale: Hurts even more Pain Location: RLE Pain Descriptors / Indicators: Discomfort Pain Intervention(s): Monitored during session    Home Living                      Prior Function  PT Goals (current goals can now be found in the care plan section) Acute Rehab PT Goals Patient Stated Goal: Improve ambulation PT Goal Formulation: With patient Time For Goal Achievement: 01/29/17 Potential to Achieve Goals: Good    Frequency    Min 3X/week      PT Plan Discharge plan needs to be updated    Co-evaluation              AM-PAC PT "6 Clicks" Daily Activity  Outcome Measure  Difficulty turning over in bed (including adjusting bedclothes, sheets and blankets)?: None Difficulty moving from lying on back to sitting on the side of the bed? : None Difficulty sitting down on and standing up from a chair with arms (e.g., wheelchair, bedside commode, etc,.)?: Total Help needed moving to and from a bed to chair (including a wheelchair)?: A Little Help needed  walking in hospital room?: A Little Help needed climbing 3-5 steps with a railing? : A Lot 6 Click Score: 17    End of Session Equipment Utilized During Treatment:  (Pt adamantly refused use of gait belt and transferred out of recliner chair impulsively ) Activity Tolerance: Patient limited by pain;Treatment limited secondary to agitation Patient left: in chair;with call bell/phone within reach;with family/visitor present   PT Visit Diagnosis: Difficulty in walking, not elsewhere classified (R26.2);Pain Pain - Right/Left: Right Pain - part of body: Leg     Time: 1610-9604 PT Time Calculation (min) (ACUTE ONLY): 18 min  Charges:  $Gait Training: 8-22 mins                    G Codes:       Arneta Cliche, SPT Acute Rehab [phone]    Arneta Cliche 01/15/2017, 10:59 AM

## 2017-01-15 NOTE — Telephone Encounter (Signed)
Please see below.

## 2017-01-15 NOTE — Discharge Summary (Signed)
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  Discharged Condition: Improved  Hospital Course: Mary Long is an 41 y.o. female who was admitted 01/06/2017 with a chief complaint of infected retained hardware with osteomyelitis right knee, with a final diagnosis of Osteomyelitis Right Femur.  Patient was brought to the operating room on 01/06/2017 - 01/12/2017 and underwent Procedure(s): RIGHT ABOVE KNEE AMPUTATION.    Patient was given perioperative antibiotics: Anti-infectives    Start     Dose/Rate Route Frequency Ordered Stop   01/12/17 2300  ceFAZolin (ANCEF) IVPB 1 g/50 mL premix     1 g 100 mL/hr over 30 Minutes Intravenous Every 6 hours 01/12/17 1851 01/13/17 1811   01/12/17 1600  ceFAZolin (ANCEF) IVPB 2g/100 mL premix     2 g 200 mL/hr over 30 Minutes Intravenous To ShortStay Surgical 01/12/17 1242 01/12/17 1655   01/10/17 0000  vancomycin (VANCOCIN) IVPB 750 mg/150 ml premix     750 mg 150 mL/hr over 60 Minutes Intravenous Every 8 hours 01/09/17 1657     01/07/17 0400  vancomycin (VANCOCIN) 500 mg in sodium chloride 0.9 % 100 mL IVPB  Status:  Discontinued     500 mg 100 mL/hr over 60 Minutes Intravenous Every 12 hours 01/06/17 2105 01/09/17 1657    .  Patient was given sequential compression devices, early ambulation, and aspirin for DVT prophylaxis.  Recent vital signs: Patient Vitals for the past 24 hrs:  BP Temp Temp src Pulse Resp SpO2  01/15/17 0643 (!) 92/49 98.4 F (36.9 C) Oral 92 14 96 %  01/15/17 0408 - - - - 14 96 %  01/15/17 0132 117/61 98.4 F (36.9 C) - 100 17 97 %  01/14/17 2359 - - - - 16 98 %  01/14/17 2108 (!)  105/53 99 F (37.2 C) Oral 92 16 99 %  01/14/17 2025 - - - - 14 98 %  01/14/17 1956 - (!) 101 F (38.3 C) Oral - - -  01/14/17 1811 (!) 87/67 (!) 101.2 F (38.4 C) Oral 98 16 92 %  01/14/17 1615 - - - - 15 95 %  01/14/17 1401 (!) 127/99 100.1 F (37.8 C) Oral 89 16 95 %  01/14/17 1258 - - - - 15 98 %  01/14/17 1004 (!) 87/54 98.2 F (36.8 C) Oral 98 16 95 %  01/14/17 0842 - - - - 15 98 %  .  Recent laboratory studies: No results found.  Discharge Medications:   Allergies as of 01/15/2017      Reactions   Tylenol [acetaminophen] Nausea And Vomiting, Other (See Comments)   Dizziness & LIVER DISEASE      Medication List    STOP taking these medications   cephALEXin 500 MG capsule Commonly known as:  KEFLEX   doxycycline 100 MG capsule Commonly known as:  MONODOX     TAKE these medications   ALPRAZolam 1 MG tablet Commonly known as:  XANAX Take 1 mg by mouth 2 (two) times daily as needed for anxiety.   amphetamine-dextroamphetamine 20 MG tablet Commonly known as:  ADDERALL Take 20 mg by mouth 2 (two) times daily as  needed (for focus at work).   METHADONE HCL PO Take 80 mg by mouth daily.   oxyCODONE-acetaminophen 5-325 MG tablet Commonly known as:  ROXICET Take 1 tablet by mouth every 4 (four) hours as needed for severe pain.   zolpidem 10 MG tablet Commonly known as:  AMBIEN Take 10 mg by mouth at bedtime as needed for sleep. For sleep       Diagnostic Studies: Ir Fluoro Guide Cv Line Right  Result Date: 01/06/2017 INDICATION: INFECTED KNEE JOINT, POOR PERIPHERAL ACCESS, PLAN FOR OPERATIVE INCISION AND DRAINAGE EXAM: ULTRASOUND AND FLUOROSCOPIC GUIDED PICC LINE INSERTION MEDICATIONS: 1% lidocaine local CONTRAST:  None FLUOROSCOPY TIME:  Twelve seconds (0 mGy) COMPLICATIONS: None immediate. TECHNIQUE: The procedure, risks, benefits, and alternatives were explained to the patient and informed written consent was obtained. A timeout was performed prior to the  initiation of the procedure. The right upper extremity was prepped with chlorhexidine in a sterile fashion, and a sterile drape was applied covering the operative field. Maximum barrier sterile technique with sterile gowns and gloves were used for the procedure. A timeout was performed prior to the initiation of the procedure. Local anesthesia was provided with 1% lidocaine. Under direct ultrasound guidance, the right basilic vein was accessed with a micropuncture kit after the overlying soft tissues were anesthetized with 1% lidocaine. An ultrasound image was saved for documentation purposes. A guidewire was advanced to the level of the superior caval-atrial junction for measurement purposes and the PICC line was cut to length. A peel-away sheath was placed and a 37 cm, 5 [place], dual lumen was inserted to level of the superior caval-atrial junction. A post procedure spot fluoroscopic was obtained. The catheter easily aspirated and flushed and was sutured in place. A dressing was placed. The patient tolerated the procedure well without immediate post procedural complication. FINDINGS: After catheter placement, the tip lies within the superior cavoatrial junction. The catheter aspirates and flushes normally and is ready for immediate use. IMPRESSION: Successful ultrasound and fluoroscopic guided placement of a right basilic vein approach, 37 cm, 5 French, dual lumen PICC with tip at the superior caval-atrial junction. The PICC line is ready for immediate use. Electronically Signed   By: Jerilynn Mages.  Shick M.D.   On: 01/06/2017 11:00   Ir US Guide Vasc Access Right  Result Date: 01/06/2017 INDICATION: INFECTED KNEE JOINT, POOR PERIPHERAL ACCESS, PLAN FOR OPERATIVE INCISION AND DRAINAGE EXAM: ULTRASOUND AND FLUOROSCOPIC GUIDED PICC LINE INSERTION MEDICATIONS: 1% lidocaine local CONTRAST:  None FLUOROSCOPY TIME:  Twelve seconds (0 mGy) COMPLICATIONS: None immediate. TECHNIQUE: The procedure, risks, benefits, and  alternatives were explained to the patient and informed written consent was obtained. A timeout was performed prior to the initiation of the procedure. The right upper extremity was prepped with chlorhexidine in a sterile fashion, and a sterile drape was applied covering the operative field. Maximum barrier sterile technique with sterile gowns and gloves were used for the procedure. A timeout was performed prior to the initiation of the procedure. Local anesthesia was provided with 1% lidocaine. Under direct ultrasound guidance, the right basilic vein was accessed with a micropuncture kit after the overlying soft tissues were anesthetized with 1% lidocaine. An ultrasound image was saved for documentation purposes. A guidewire was advanced to the level of the superior caval-atrial junction for measurement purposes and the PICC line was cut to length. A peel-away sheath was placed and a 37 cm, 5 [place], dual lumen was inserted to level of the superior caval-atrial junction. A  post procedure spot fluoroscopic was obtained. The catheter easily aspirated and flushed and was sutured in place. A dressing was placed. The patient tolerated the procedure well without immediate post procedural complication. FINDINGS: After catheter placement, the tip lies within the superior cavoatrial junction. The catheter aspirates and flushes normally and is ready for immediate use. IMPRESSION: Successful ultrasound and fluoroscopic guided placement of a right basilic vein approach, 37 cm, 5 French, dual lumen PICC with tip at the superior caval-atrial junction. The PICC line is ready for immediate use. Electronically Signed   By: Jerilynn Mages.  Shick M.D.   On: 01/06/2017 11:00    Patient benefited maximally from their hospital stay and there were no complications.     Disposition: 01-Home or Self Care Discharge Instructions    Call MD / Call 911    Complete by:  As directed    If you experience chest pain or shortness of breath, CALL 911  and be transported to the hospital emergency room.  If you develope a fever above 101 F, pus (white drainage) or increased drainage or redness at the wound, or calf pain, call your surgeon's office.   Constipation Prevention    Complete by:  As directed    Drink plenty of fluids.  Prune juice may be helpful.  You may use a stool softener, such as Colace (over the counter) 100 mg twice a day.  Use MiraLax (over the counter) for constipation as needed.   Diet - low sodium heart healthy    Complete by:  As directed    Elevate operative extremity    Complete by:  As directed    Increase activity slowly as tolerated    Complete by:  As directed    Negative Pressure Wound Therapy - Incisional    Complete by:  As directed    Disconnect the VAC from the hospital pump and attached to the Prevena plus portable pump that can be obtained in the operating room. Patient will need to ensure this is plugged in and charged. Leave in place for 1 week.   Non weight bearing    Complete by:  As directed    Laterality:  right   Extremity:  Lower     Follow-up Information    Newt Minion, MD Follow up in 1 week(s).   Specialty:  Orthopedic Surgery Contact information: [address] [place] [address] [phone]            Signed: Newt Minion 01/15/2017, 6:52 AM

## 2017-01-15 NOTE — Progress Notes (Signed)
OT Cancellation Note  Patient Details Name: Mary Long MRN: [id number] DOB: [birth year]   Cancelled Treatment:    Reason Eval/Treat Not Completed: Other (comment). Pt declined OT stating that she just recently had PT and that she couldn't have anymore pain meds  Galen ManilaSpencer, Candela Krul Jeanette 01/15/2017, 11:17 AM

## 2017-01-15 NOTE — Progress Notes (Signed)
Patient discharged to home with prescription and instructions, [facility], wheelchair sent with patient , was not happy when PICC line was d/c'd.

## 2017-01-15 NOTE — Telephone Encounter (Signed)
Received call from the answering service advised they received a call from [place] [facility] needing orders to discontinue picc line for patient. The number to contact Randa EvensJoanne is [phone]

## 2017-01-15 NOTE — Progress Notes (Signed)
PT Note   Signed and in agreement for pt to receive wheelchair for home use.  RN case manager aware and to obtain needed DME prior to d/c.  Schlateryndi Wynn, [place] [phone] 01/15/2017

## 2017-01-15 NOTE — Care Management Note (Signed)
Case Management Note  Patient Details  Name: Mary Long MRN: [id number] Date of Birth: 1976-06-07  Subjective/Objective:    41 yr old female admitted on 6/26 with infected retained hardware of the right femur, osteo was found. 01/12/17 patient underwent a right AKA.                Action/Plan:  Case manager spke with patient concerning discharge plan and DME needs. Choice for Home Health Agency was offered. Referral was called to Shaune LeeksJermaine jenkins, [facility] Liaison. CM discussed patient's need for a wheelchair, she requests a narrow chair because her house is small. Patient will go home with prevena wound vac.    Expected Discharge Date:  01/15/17               Expected Discharge Plan:  Home w Home Health Services  In-House Referral:  NA  Discharge planning Services  CM Consult  Post Acute Care Choice:  Home Health, Durable Medical Equipment Choice offered to:  Patient  DME Arranged:  [place] rolling DME Agency:  [facility]  [facility]  Status of Service:  Completed, signed off  If discussed at Long Length of Stay Meetings, dates discussed:    Additional Comments:  Durenda GuthrieBrady, Davied Nocito Naomi, RN 01/15/2017, 10:17 AM

## 2017-01-15 NOTE — Telephone Encounter (Signed)
Dr. Lajoyce Cornersuda addressing with nurse today.

## 2017-01-23 ENCOUNTER — Ambulatory Visit (INDEPENDENT_AMBULATORY_CARE_PROVIDER_SITE_OTHER): Payer: Medicare Other | Admitting: Orthopedic Surgery

## 2017-01-23 ENCOUNTER — Encounter (INDEPENDENT_AMBULATORY_CARE_PROVIDER_SITE_OTHER): Payer: Self-pay | Admitting: Orthopedic Surgery

## 2017-01-23 VITALS — Ht 61.0 in | Wt 115.0 lb

## 2017-01-23 DIAGNOSIS — M86251 Subacute osteomyelitis, right femur: Secondary | ICD-10-CM

## 2017-01-23 DIAGNOSIS — Z89611 Acquired absence of right leg above knee: Secondary | ICD-10-CM

## 2017-01-23 NOTE — Progress Notes (Signed)
Post-Op Visit Note   Patient: Mary Long           Date of Birth: [birth year]           MRN: [id number] Visit Date: 01/23/2017 PCP: Patient, No Pcp Per  Chief Complaint:  Chief Complaint  Patient presents with  . Right Leg - Follow-up    01/12/17 Right AKA with prevena wound vac 11 days post op    HPI:  The patient is a 41 year old woman who is seen today 11 days status post right above-the-knee amputation. She states she is doing been doing well she is returned to work she is stimulating with crutches.    Ortho Exam Staples and sutures remain in place the incision is healing well. There is no gaping no drainage no erythema no sign of infection.  Visit Diagnoses:  1. History of right above knee amputation (HCC)   2. Subacute osteomyelitis of right femur (HCC)     Plan: The patient will follow-up in 1 week for suture and staple removal. Placed an order today for [employer] for prosthesis set up she will begin wearing shrinker when she has gotten it.  Follow-Up Instructions: Return in about 1 week (around 01/30/2017).   Imaging: No results found.  Orders:  No orders of the defined types were placed in this encounter.  No orders of the defined types were placed in this encounter.    PMFS History: Patient Active Problem List   Diagnosis Date Noted  . Staphylococcal arthritis of right knee (HCC)   . Acute hematogenous osteomyelitis (HCC)   . MRSA infection   . Osteomyelitis of right femur (HCC) 01/06/2017  . Fracture of distal femur (HCC) 12/18/2012   Past Medical History:  Diagnosis Date  . Bipolar 1 disorder (HCC)   . Blindness of left eye   . 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 Complication of anesthesia    causes manic attacks and anxiety post anesthesia  . Depression   . Hep C w/o coma, chronic (HCC)   . Illicit drug use   . Osteomyelitis of right femur (HCC) 01/06/2017  . Seizures (HCC)     History reviewed. No pertinent family  history.  Past Surgical History:  Procedure Laterality Date  . AMPUTATION Right 02/03/2014   Procedure: RIGHT AMPUTATION LEG THROUGH TIBIA/FIBULA;  Surgeon: Eulas PostJoshua P Landau, MD;  Location: [facility] OR;  Service: Orthopedics;  Laterality: Right;  . AMPUTATION Right 01/12/2017   Procedure: RIGHT ABOVE KNEE AMPUTATION;  Surgeon: Nadara Mustarduda, Marcus V, MD;  [facility];  Service: Orthopedics;  Laterality: Right;  . APPLICATION OF WOUND VAC Right 01/06/2017   Procedure: APPLICATION OF WOUND VAC;  Surgeon: Teryl LucyLandau, Joshua, MD;  Location: [facility] OR;  Service: Orthopedics;  Laterality: Right;  . FRACTURE SURGERY    . HARDWARE REMOVAL Right 01/06/2017   Procedure: HARDWARE REMOVAL RIGHT KNEE;  Surgeon: Teryl LucyLandau, Joshua, MD;  Location: [facility] OR;  Service: Orthopedics;  Laterality: Right;  . I&D of 4 abscesses to right arm and wrist  06/18/2003  . IR FLUORO GUIDE CV LINE RIGHT  01/06/2017  . IR US GUIDE VASC ACCESS RIGHT  01/06/2017  . IRRIGATION AND DEBRIDEMENT KNEE Right 01/06/2017   Procedure: IRRIGATION AND DEBRIDEMENT RIGHT KNEE AND IRRIGATION AND DEBRIDEMENT BONE;  Surgeon: Teryl LucyLandau, Joshua, MD;  Location: [facility] OR;  Service: Orthopedics;  Laterality: Right;  . ORIF FEMUR FRACTURE Right 12/18/2012   Procedure: OPEN REDUCTION INTERNAL FIXATION (ORIF) DISTAL FEMUR FRACTURE;  Surgeon: Ivin BootyJoshua  Robie Ridge, MD;  Location: [facility] OR;  Service: Orthopedics;  Laterality: Right;  . TUBAL LIGATION  01/01/2001   Social History   Occupational History  . Not on file.   Social History Main Topics  . Smoking status: Current Every Day Smoker    Packs/day: 1.00    Types: Cigarettes  . Smokeless tobacco: Never Used     Comment: smokes electronic cigarettes  . Alcohol use No  . Drug use: Yes    Types: Cocaine, Heroin     Comment: History of heroin and cocaine use; none for "years"  . Sexual activity: Yes

## 2017-01-30 ENCOUNTER — Ambulatory Visit (INDEPENDENT_AMBULATORY_CARE_PROVIDER_SITE_OTHER): Payer: Medicare Other | Admitting: Family

## 2017-01-30 DIAGNOSIS — Z89611 Acquired absence of right leg above knee: Secondary | ICD-10-CM

## 2017-01-30 DIAGNOSIS — IMO0002 Reserved for concepts with insufficient information to code with codable children: Secondary | ICD-10-CM | POA: Insufficient documentation

## 2017-01-30 NOTE — Progress Notes (Signed)
Post-Op Visit Note   Patient: Mary Long           Date of Birth: [birth year]           MRN: [id number] Visit Date: 01/30/2017 PCP: Patient, No Pcp Per  Chief Complaint: No chief complaint on file.   HPI:  The patient is a 41 year old woman who presents today 2 weeks status post right above-the-knee amputation sutures and staples to be removed today. She is doing well overall ambulating with crutches concerns.    Ortho Exam Incision well proximate with sutures and staples is healing well. There is some gaping centrally. No drainage no erythema no odor no sign of infection. The medial and lateral sutures and staples were harvested without incident. We'll leave the central staples until next week.  Visit Diagnoses:  1. Above knee amputation status, right (HCC)     Plan: Follow up in one more week for staple removal and excisional check. Continue him daily Dial soap cleansing and dry dressings.  Follow-Up Instructions: Return in about 1 week (around 02/06/2017).   Imaging: No results found.  Orders:  No orders of the defined types were placed in this encounter.  No orders of the defined types were placed in this encounter.    PMFS History: Patient Active Problem List   Diagnosis Date Noted  . Above knee amputation status, right (HCC) 01/30/2017  . Staphylococcal arthritis of right knee (HCC)   . Acute hematogenous osteomyelitis (HCC)   . MRSA infection   . Osteomyelitis of right femur (HCC) 01/06/2017  . Fracture of distal femur (HCC) 12/18/2012   Past Medical History:  Diagnosis Date  . Bipolar 1 disorder (HCC)   . Blindness of left eye   . 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 Complication of anesthesia    causes manic attacks and anxiety post anesthesia  . Depression   . Hep C w/o coma, chronic (HCC)   . Illicit drug use   . Osteomyelitis of right femur (HCC) 01/06/2017  . Seizures (HCC)     No family history on file.  Past Surgical  History:  Procedure Laterality Date  . AMPUTATION Right 02/03/2014   Procedure: RIGHT AMPUTATION LEG THROUGH TIBIA/FIBULA;  Surgeon: Eulas Post, MD;  Location: [facility] OR;  Service: Orthopedics;  Laterality: Right;  . AMPUTATION Right 01/12/2017   Procedure: RIGHT ABOVE KNEE AMPUTATION;  Surgeon: Nadara Mustard, MD;  [facility];  Service: Orthopedics;  Laterality: Right;  . APPLICATION OF WOUND VAC Right 01/06/2017   Procedure: APPLICATION OF WOUND VAC;  Surgeon: Teryl Lucy, MD;  Location: [facility] OR;  Service: Orthopedics;  Laterality: Right;  . FRACTURE SURGERY    . HARDWARE REMOVAL Right 01/06/2017   Procedure: HARDWARE REMOVAL RIGHT KNEE;  Surgeon: Teryl Lucy, MD;  Location: [facility] OR;  Service: Orthopedics;  Laterality: Right;  . I&D of 4 abscesses to right arm and wrist  06/18/2003  . IR FLUORO GUIDE CV LINE RIGHT  01/06/2017  . IR US GUIDE VASC ACCESS RIGHT  01/06/2017  . IRRIGATION AND DEBRIDEMENT KNEE Right 01/06/2017   Procedure: IRRIGATION AND DEBRIDEMENT RIGHT KNEE AND IRRIGATION AND DEBRIDEMENT BONE;  Surgeon: Teryl Lucy, MD;  Location: [facility] OR;  Service: Orthopedics;  Laterality: Right;  . ORIF FEMUR FRACTURE Right 12/18/2012   Procedure: OPEN REDUCTION INTERNAL FIXATION (ORIF) DISTAL FEMUR FRACTURE;  Surgeon: Eulas Post, MD;  Location: [facility] OR;  Service: Orthopedics;  Laterality: Right;  . TUBAL LIGATION  01/01/2001   Social History   Occupational History  . Not on file.   Social History Main Topics  . Smoking status: Current Every Day Smoker    Packs/day: 1.00    Types: Cigarettes  . Smokeless tobacco: Never Used     Comment: smokes electronic cigarettes  . Alcohol use No  . Drug use: Yes    Types: Cocaine, Heroin     Comment: History of heroin and cocaine use; none for "years"  . Sexual activity: Yes

## 2017-02-03 ENCOUNTER — Telehealth (INDEPENDENT_AMBULATORY_CARE_PROVIDER_SITE_OTHER): Payer: Self-pay

## 2017-02-03 NOTE — Telephone Encounter (Signed)
Patient would like to know if she can get a letter from Dr. Lajoyce Cornersuda stating that she was not able to fly due to her amputation. Would like to get letter when she comes for her appointment on Thursday, 02/05/17.  CB# is [phone].  Please advise.  Thank You.

## 2017-02-04 NOTE — Telephone Encounter (Signed)
Pt is a right above the knee amputation. Will write letter for pt tomorrow while in office.

## 2017-02-05 ENCOUNTER — Ambulatory Visit (INDEPENDENT_AMBULATORY_CARE_PROVIDER_SITE_OTHER): Payer: Medicare Other | Admitting: Family

## 2017-02-05 DIAGNOSIS — IMO0002 Reserved for concepts with insufficient information to code with codable children: Secondary | ICD-10-CM

## 2017-02-05 DIAGNOSIS — Z89611 Acquired absence of right leg above knee: Secondary | ICD-10-CM

## 2017-02-05 NOTE — Progress Notes (Signed)
Post-Op Visit Note   Patient: Mary Long           Date of Birth: 25-Jun-1976           MRN: [id number] Visit Date: 02/05/2017 PCP: Patient, No Pcp Per  Chief Complaint: No chief complaint on file.   HPI:  The patient is a 41 year old woman seen status post right above knee amputation on 01/12/17. No concerns voiced today.    Ortho Exam Incision is healing well overall. Open area centrally is 5 mm in diameter, no depth. Scant serousanguinous drainage. No surrounding erythema or odor. No sign of infection.   Visit Diagnoses:  1. Above knee amputation status, right (HCC)     Plan: remainder of staples harvested today without incident. continue daily dry dressing changes. proceed with prosthesis set up as healed. Begin wearing shrinker daily once obtained. Follow up in office in 3 weeks.  Follow-Up Instructions: Return in about 3 weeks (around 02/26/2017).   Imaging: No results found.  Orders:  No orders of the defined types were placed in this encounter.  No orders of the defined types were placed in this encounter.    PMFS History: Patient Active Problem List   Diagnosis Date Noted  . Above knee amputation status, right (HCC) 01/30/2017  . Staphylococcal arthritis of right knee (HCC)   . Acute hematogenous osteomyelitis (HCC)   . MRSA infection   . Osteomyelitis of right femur (HCC) 01/06/2017  . Fracture of distal femur (HCC) 12/18/2012   Past Medical History:  Diagnosis Date  . Bipolar 1 disorder (HCC)   . Blindness of left eye   . 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 Complication of anesthesia    causes manic attacks and anxiety post anesthesia  . Depression   . Hep C w/o coma, chronic (HCC)   . Illicit drug use   . Osteomyelitis of right femur (HCC) 01/06/2017  . Seizures (HCC)     No family history on file.  Past Surgical History:  Procedure Laterality Date  . AMPUTATION Right 02/03/2014   Procedure: RIGHT AMPUTATION LEG THROUGH  TIBIA/FIBULA;  Surgeon: Eulas PostJoshua P Landau, MD;  [facility];  Service: Orthopedics;  Laterality: Right;  . AMPUTATION Right 01/12/2017   Procedure: RIGHT ABOVE KNEE AMPUTATION;  Surgeon: Nadara Mustarduda, Marcus V, MD;  [facility];  Service: Orthopedics;  Laterality: Right;  . APPLICATION OF WOUND VAC Right 01/06/2017   Procedure: APPLICATION OF WOUND VAC;  Surgeon: Teryl LucyLandau, Joshua, MD;  Location: [facility] OR;  Service: Orthopedics;  Laterality: Right;  . FRACTURE SURGERY    . HARDWARE REMOVAL Right 01/06/2017   Procedure: HARDWARE REMOVAL RIGHT KNEE;  Surgeon: Teryl LucyLandau, Joshua, MD;  Location: [facility] OR;  Service: Orthopedics;  Laterality: Right;  . I&D of 4 abscesses to right arm and wrist  06/18/2003  . IR FLUORO GUIDE CV LINE RIGHT  01/06/2017  . IR US GUIDE VASC ACCESS RIGHT  01/06/2017  . IRRIGATION AND DEBRIDEMENT KNEE Right 01/06/2017   Procedure: IRRIGATION AND DEBRIDEMENT RIGHT KNEE AND IRRIGATION AND DEBRIDEMENT BONE;  Surgeon: Teryl LucyLandau, Joshua, MD;  Location: [facility] OR;  Service: Orthopedics;  Laterality: Right;  . ORIF FEMUR FRACTURE Right 12/18/2012   Procedure: OPEN REDUCTION INTERNAL FIXATION (ORIF) DISTAL FEMUR FRACTURE;  Surgeon: Eulas PostJoshua P Landau, MD;  Location: [facility] OR;  Service: Orthopedics;  Laterality: Right;  . TUBAL LIGATION  01/01/2001   Social History   Occupational History  . Not on file.  Social History Main Topics  . Smoking status: Current Every Day Smoker    Packs/day: 1.00    Types: Cigarettes  . Smokeless tobacco: Never Used     Comment: smokes electronic cigarettes  . Alcohol use No  . Drug use: Yes    Types: Cocaine, Heroin     Comment: History of heroin and cocaine use; none for "years"  . Sexual activity: Yes

## 2017-02-26 ENCOUNTER — Encounter (INDEPENDENT_AMBULATORY_CARE_PROVIDER_SITE_OTHER): Payer: Self-pay | Admitting: Family

## 2017-02-26 ENCOUNTER — Ambulatory Visit (INDEPENDENT_AMBULATORY_CARE_PROVIDER_SITE_OTHER): Payer: Medicare Other | Admitting: Family

## 2017-02-26 DIAGNOSIS — IMO0002 Reserved for concepts with insufficient information to code with codable children: Secondary | ICD-10-CM

## 2017-02-26 DIAGNOSIS — Z89611 Acquired absence of right leg above knee: Secondary | ICD-10-CM

## 2017-02-26 NOTE — Progress Notes (Addendum)
Post-Op Visit Note   Patient: Mary Long           Date of Birth: 10-Apr-1976           MRN: [id number] Visit Date: 02/26/2017 PCP: Patient, No Pcp Per  Chief Complaint:  Chief Complaint  Patient presents with  . Right Knee - Routine Post Op, Follow-up    HPI:  The patient is a 41 year old woman is status post right above-the-knee amputation she is about a month and a half out from surgery. She's been ambulating with crutches without any concerns states she is no longer had any drainage is doing well. Is getting her prosthetic next week.  The patient is a strong desire to into a independently. She previously been an independent ambulator at the level of K3 as below the knee amputee with great success stability. She has no reason to expect difficulty ambulating as above-the-knee amputee.    Ortho Exam On examination the right above-the-knee amputation is well healed well consolidated no erythema no open area.   Visit Diagnoses:  1. Above knee amputation status, right [employer](HCC)     Plan: Have provided an order to [facility] for a K3 level prosthesis for her above-the-knee amputation on the right. Expect she'll progress quickly with therapy as she has upper body and lower body strengthening and desire to be successful ambulating with her new prosthesis. She has no formal read conditions to affect her ability to use the prosthesis or ambulate. She'll follow-up in office in 2 months  Follow-Up Instructions: Return in about 2 months (around 04/28/2017).   Imaging: No results found.  Orders:  No orders of the defined types were placed in this encounter.  No orders of the defined types were placed in this encounter.    PMFS History: Patient Active Problem List   Diagnosis Date Noted  . Above knee amputation status, right (HCC) 01/30/2017  . Staphylococcal arthritis of right knee (HCC)   . Acute hematogenous osteomyelitis (HCC)   . MRSA infection   . Osteomyelitis of right  femur (HCC) 01/06/2017  . Fracture of distal femur (HCC) 12/18/2012   Past Medical History:  Diagnosis Date  . Bipolar 1 disorder (HCC)   . Blindness of left eye   . 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 Complication of anesthesia    causes manic attacks and anxiety post anesthesia  . Depression   . Hep C w/o coma, chronic (HCC)   . Illicit drug use   . Osteomyelitis of right femur (HCC) 01/06/2017  . Seizures (HCC)     No family history on file.  Past Surgical History:  Procedure Laterality Date  . AMPUTATION Right 02/03/2014   Procedure: RIGHT AMPUTATION LEG THROUGH TIBIA/FIBULA;  Surgeon: Eulas PostJoshua P Landau, MD;  [facility];  Service: Orthopedics;  Laterality: Right;  . AMPUTATION Right 01/12/2017   Procedure: RIGHT ABOVE KNEE AMPUTATION;  Surgeon: Nadara Mustarduda, Marcus V, MD;  [facility];  Service: Orthopedics;  Laterality: Right;  . APPLICATION OF WOUND VAC Right 01/06/2017   Procedure: APPLICATION OF WOUND VAC;  Surgeon: Teryl LucyLandau, Joshua, MD;  [facility];  Service: Orthopedics;  Laterality: Right;  . FRACTURE SURGERY    . HARDWARE REMOVAL Right 01/06/2017   Procedure: HARDWARE REMOVAL RIGHT KNEE;  Surgeon: Teryl LucyLandau, Joshua, MD;  [facility];  Service: Orthopedics;  Laterality: Right;  . I&D of 4 abscesses to right arm and wrist  06/18/2003  . IR FLUORO GUIDE CV LINE  RIGHT  01/06/2017  . IR US GUIDE VASC ACCESS RIGHT  01/06/2017  . IRRIGATION AND DEBRIDEMENT KNEE Right 01/06/2017   Procedure: IRRIGATION AND DEBRIDEMENT RIGHT KNEE AND IRRIGATION AND DEBRIDEMENT BONE;  Surgeon: Teryl Lucy, MD;  [facility];  Service: Orthopedics;  Laterality: Right;  . ORIF FEMUR FRACTURE Right 12/18/2012   Procedure: OPEN REDUCTION INTERNAL FIXATION (ORIF) DISTAL FEMUR FRACTURE;  Surgeon: Eulas Post, MD;  [facility];  Service: Orthopedics;  Laterality: Right;  . TUBAL LIGATION  01/01/2001   Social History   Occupational History  . Not on file.   Social History  Main Topics  . Smoking status: Current Every Day Smoker    Packs/day: 1.00    Types: Cigarettes  . Smokeless tobacco: Never Used     Comment: smokes electronic cigarettes  . Alcohol use No  . Drug use: Yes    Types: Cocaine, Heroin     Comment: History of heroin and cocaine use; none for "years"  . Sexual activity: Yes

## 2017-04-01 ENCOUNTER — Telehealth (INDEPENDENT_AMBULATORY_CARE_PROVIDER_SITE_OTHER): Payer: Self-pay | Admitting: Orthopedic Surgery

## 2017-04-01 NOTE — Telephone Encounter (Signed)
LAST 3 OV NOTES FAXED TO [facility]

## 2017-04-29 ENCOUNTER — Ambulatory Visit (INDEPENDENT_AMBULATORY_CARE_PROVIDER_SITE_OTHER): Payer: Medicare Other | Admitting: Family

## 2017-04-29 NOTE — Telephone Encounter (Signed)
Letter for microprocessor given to [employer] from [facility].

## 2017-06-03 ENCOUNTER — Telehealth (INDEPENDENT_AMBULATORY_CARE_PROVIDER_SITE_OTHER): Payer: Self-pay | Admitting: Radiology

## 2017-06-03 DIAGNOSIS — R269 Unspecified abnormalities of gait and mobility: Secondary | ICD-10-CM

## 2017-06-03 DIAGNOSIS — Z89611 Acquired absence of right leg above knee: Secondary | ICD-10-CM

## 2017-06-03 NOTE — Telephone Encounter (Signed)
[facility] per [facility] and [facility] request for physical therapy and gait training.

## 2017-06-22 ENCOUNTER — Ambulatory Visit: Payer: Medicare Other | Admitting: Physical Therapy

## 2017-12-15 ENCOUNTER — Telehealth (INDEPENDENT_AMBULATORY_CARE_PROVIDER_SITE_OTHER): Payer: Self-pay | Admitting: Orthopedic Surgery

## 2017-12-15 NOTE — Telephone Encounter (Signed)
Patient requesting prescription be sent to [facility] for them to start making her second leg prosthetic, she said her first one has gotten too big. Does she need an appt first? Patients # [phone]

## 2017-12-16 NOTE — Telephone Encounter (Signed)
Pt is a right AKA and order has been written. Will fax to [facility] today.  Called pt ot advise. She was last in the office in august. Insurance may require her to come in for eval but advised [facility] will let her know if this is needed.

## 2018-02-22 ENCOUNTER — Telehealth (INDEPENDENT_AMBULATORY_CARE_PROVIDER_SITE_OTHER): Payer: Self-pay | Admitting: Orthopedic Surgery

## 2018-02-22 NOTE — Telephone Encounter (Signed)
This is in Dr. Audrie Liauda's pending signature stack

## 2018-02-22 NOTE — Telephone Encounter (Signed)
[facility]  Marcelino DusterMichelle  [phone]   LMN form signed off needed

## 2018-10-23 IMAGING — XA IR US GUIDE VASC ACCESS RIGHT
1 series · 1 of 1 positions shown · IV contrast (agent unspecified)
Comparison: none

INDICATION: INFECTED KNEE JOINT, POOR PERIPHERAL ACCESS, PLAN FOR OPERATIVE
INCISION AND DRAINAGE

EXAM:
ULTRASOUND AND FLUOROSCOPIC GUIDED PICC LINE INSERTION
MEDICATIONS:
1% lidocaine local
CONTRAST:  None
FLUOROSCOPY TIME:  Twelve seconds (0 mGy)
COMPLICATIONS:
None immediate.
TECHNIQUE: The procedure, risks, benefits, and alternatives were explained to
the patient and informed written consent was obtained. A timeout was
performed prior to the initiation of the procedure.

[Series 1: fl (-) angio · 1 of 1 slices shown]
[im 1/1]
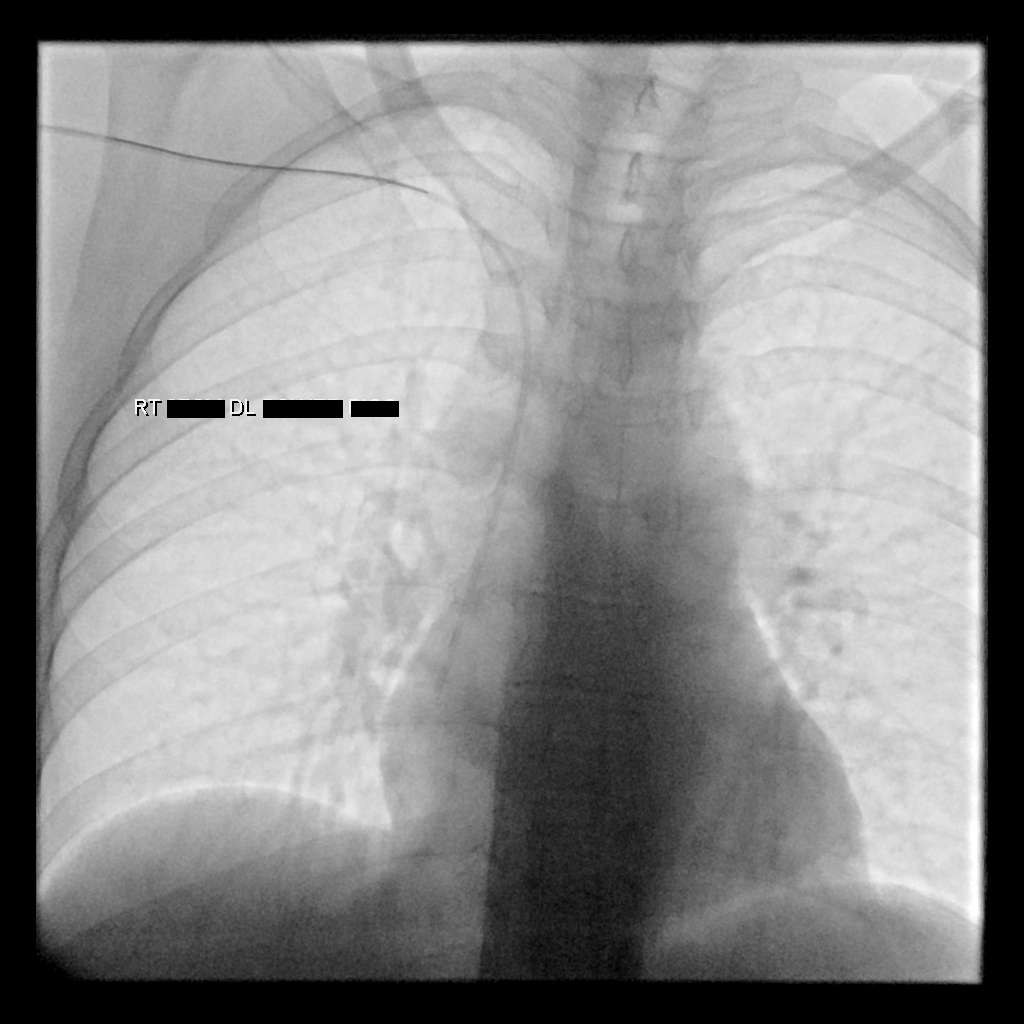

[1 of 1 positions shown; findings below may reference images not displayed]

The right upper extremity was prepped with chlorhexidine in a
sterile fashion, and a sterile drape was applied covering the
operative field. Maximum barrier sterile technique with sterile
gowns and gloves were used for the procedure. A timeout was
performed prior to the initiation of the procedure. Local anesthesia
was provided with 1% lidocaine.

Under direct ultrasound guidance, the right basilic vein was
accessed with a micropuncture kit after the overlying soft tissues
were anesthetized with 1% lidocaine. An ultrasound image was saved
for documentation purposes. A guidewire was advanced to the level of
the superior caval-atrial junction for measurement purposes and the
PICC line was cut to length. A peel-away sheath was placed and a 37
cm, 5 French, dual lumen was inserted to level of the superior
caval-atrial junction. A post procedure spot fluoroscopic was
obtained. The catheter easily aspirated and flushed and was sutured
in place. A dressing was placed. The patient tolerated the procedure
well without immediate post procedural complication.
FINDINGS: After catheter placement, the tip lies within the superior
cavoatrial junction. The catheter aspirates and flushes normally and
is ready for immediate use.
IMPRESSION: Successful ultrasound and fluoroscopic guided placement of a right
basilic vein approach, 37 cm, 5 French, dual lumen PICC with tip at
the superior caval-atrial junction. The PICC line is ready for
immediate use.

## 2019-09-20 ENCOUNTER — Ambulatory Visit (INDEPENDENT_AMBULATORY_CARE_PROVIDER_SITE_OTHER): Payer: Medicare Other | Admitting: Family

## 2019-09-20 ENCOUNTER — Encounter: Payer: Self-pay | Admitting: Family

## 2019-09-20 ENCOUNTER — Other Ambulatory Visit: Payer: Self-pay

## 2019-09-20 DIAGNOSIS — Z89611 Acquired absence of right leg above knee: Secondary | ICD-10-CM

## 2019-09-20 DIAGNOSIS — S78111A Complete traumatic amputation at level between right hip and knee, initial encounter: Secondary | ICD-10-CM

## 2019-09-20 NOTE — Progress Notes (Signed)
Office Visit Note   Patient: Mary Long           Date of Birth: 12/09/1975           MRN: [id number] Visit Date: 09/20/2019              Requested by: No referring provider defined for this encounter. PCP: Patient, No Pcp Per  Chief Complaint  Patient presents with  . Right Leg - Follow-up    01/2017 right AKA      HPI: This is a pleasant 44 year old woman with a chief complaint of an ill fitting right above-knee amputation socket. She is approximately 3 years status post right above-knee amputation. She is extremely motivated and active. She has recently lost some weight and her prosthetic is extremely loose and ill fitting. She cannot even maintain suction in the current socket. This makes her unstable on her leg. She would also like a ankle-foot prosthetics so that she can wear different types of shoewear  Assessment & Plan: Visit Diagnoses: No diagnosis found.  Plan: I have given her a new prescription to have a new socket fashioned as well as a prescription for ankle /foot prosthetic that would allow range of motion of her foot. She maintains excellent motivation to use her prosthetic and stay active. She has no core comorbidities that would interact with her care and ability to use her prosthetic Follow-Up Instructions: No follow-ups on file.   Ortho Exam  Patient is alert, oriented, no adenopathy, well-dressed, normal affect, normal respiratory effort. Focused examination right above-knee amputation well-healed incision no cellulitis no skin breakdown. There is a significant size discrepancy between her current stump and her socket. I can fit entire palm of my hand between her amputation and the socket.  Imaging: No results found. No images are attached to the encounter.  Labs: Lab Results  Component Value Date   ESRSEDRATE 74 (H) 01/09/2017   ESRSEDRATE 74 (H) 01/06/2017   ESRSEDRATE 30 (H) 10/11/2011   CRP 1.8 (H) 01/09/2017   CRP 7.0 (H) 01/06/2017   REPTSTATUS 01/11/2017 FINAL 01/06/2017   REPTSTATUS 01/11/2017 FINAL 01/06/2017   GRAMSTAIN  01/06/2017    ABUNDANT WBC PRESENT,BOTH PMN AND MONONUCLEAR NO ORGANISMS SEEN    GRAMSTAIN  01/06/2017    ABUNDANT WBC PRESENT,BOTH PMN AND MONONUCLEAR NO ORGANISMS SEEN    CULT No growth aerobically or anaerobically. 01/06/2017   CULT No growth aerobically or anaerobically. 01/06/2017   LABORGA METHICILLIN RESISTANT STAPHYLOCOCCUS AUREUS 01/06/2017     Lab Results  Component Value Date   ALBUMIN 2.7 (L) 01/06/2017   ALBUMIN 2.7 (L) 02/06/2014   ALBUMIN 2.9 (L) 02/05/2014    No results found for: MG Lab Results  Component Value Date   VD25OH 44 02/04/2014    No results found for: PREALBUMIN CBC EXTENDED Latest Ref Rng & Units 01/09/2017 01/08/2017 01/07/2017  WBC 4.0 - 10.5 K/uL 5.0 5.5 6.2  RBC 3.87 - 5.11 MIL/uL 3.69(L) 3.58(L) 3.76(L)  HGB 12.0 - 15.0 g/dL 8.1(L) 8.1(L) 8.4(L)  HCT 36.0 - 46.0 % 27.5(L) 26.6(L) 28.0(L)  PLT 150 - 400 K/uL 596(H) 548(H) 552(H)  NEUTROABS 1.7 - 7.7 K/uL - - -  LYMPHSABS 0.7 - 4.0 K/uL - - -     There is no height or weight on file to calculate BMI.  Orders:  No orders of the defined types were placed in this encounter.  No orders of the defined types were placed in this encounter.  Procedures: No procedures performed  Clinical Data: No additional findings.  ROS:  All other systems negative, except as noted in the HPI. Review of Systems  Objective: Vital Signs: There were no vitals taken for this visit.  Specialty Comments:  No specialty comments available.  PMFS History: Patient Active Problem List   Diagnosis Date Noted  . Above knee amputation status, right 01/30/2017  . Staphylococcal arthritis of right knee ([place])   . Acute hematogenous osteomyelitis ([place])   . MRSA infection   . Osteomyelitis of right femur ([place]) 01/06/2017  . Fracture of distal femur ([place]) 12/18/2012   Past Medical History:  Diagnosis Date  .  Bipolar 1 disorder ([place])   . Blindness of left eye   . 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[place]) 02/03/2014  . Complication of anesthesia    causes manic attacks and anxiety post anesthesia  . Depression   . Hep C w/o coma, chronic ([place])   . Illicit drug use   . Osteomyelitis of right femur ([place]) 01/06/2017  . Seizures ([place])     No family history on file.  Past Surgical History:  Procedure Laterality Date  . AMPUTATION Right 02/03/2014   Procedure: RIGHT AMPUTATION LEG THROUGH TIBIA/FIBULA;  Surgeon: Johnny Bridge, MD;  Location: [facility];  Service: Orthopedics;  Laterality: Right;  . AMPUTATION Right 01/12/2017   Procedure: RIGHT ABOVE KNEE AMPUTATION;  Surgeon: Newt Minion, MD;  Location: [place];  Service: Orthopedics;  Laterality: Right;  . APPLICATION OF WOUND VAC Right 01/06/2017   Procedure: APPLICATION OF WOUND VAC;  Surgeon: Marchia Bond, MD;  Location: [place];  Service: Orthopedics;  Laterality: Right;  . FRACTURE SURGERY    . HARDWARE REMOVAL Right 01/06/2017   Procedure: HARDWARE REMOVAL RIGHT KNEE;  Surgeon: Marchia Bond, MD;  Location: [place];  Service: Orthopedics;  Laterality: Right;  . I&D of 4 abscesses to right arm and wrist  06/18/2003  . IR FLUORO GUIDE CV LINE RIGHT  01/06/2017  . IR US GUIDE VASC ACCESS RIGHT  01/06/2017  . IRRIGATION AND DEBRIDEMENT KNEE Right 01/06/2017   Procedure: IRRIGATION AND DEBRIDEMENT RIGHT KNEE AND IRRIGATION AND DEBRIDEMENT BONE;  Surgeon: Marchia Bond, MD;  Location: [place];  Service: Orthopedics;  Laterality: Right;  . ORIF FEMUR FRACTURE Right 12/18/2012   Procedure: OPEN REDUCTION INTERNAL FIXATION (ORIF) DISTAL FEMUR FRACTURE;  Surgeon: Johnny Bridge, MD;  Location: [place];  Service: Orthopedics;  Laterality: Right;  . TUBAL LIGATION  01/01/2001   Social History   Occupational History  . Not on file  Tobacco Use  . Smoking status: Current Every Day Smoker    Packs/day: 1.00    Types: Cigarettes  . Smokeless tobacco: Never Used    . Tobacco comment: smokes electronic cigarettes  Substance and Sexual Activity  . Alcohol use: No  . Drug use: Yes    Types: Cocaine, Heroin    Comment: History of heroin and cocaine use; none for "years"  . Sexual activity: Yes

## 2021-09-19 ENCOUNTER — Ambulatory Visit: Payer: Medicare Other | Admitting: Orthopedic Surgery

## 2021-09-24 ENCOUNTER — Encounter: Payer: Self-pay | Admitting: Family

## 2021-09-24 ENCOUNTER — Ambulatory Visit (INDEPENDENT_AMBULATORY_CARE_PROVIDER_SITE_OTHER): Payer: Medicare Other | Admitting: Family

## 2021-09-24 DIAGNOSIS — Z89611 Acquired absence of right leg above knee: Secondary | ICD-10-CM

## 2021-09-24 DIAGNOSIS — S78111A Complete traumatic amputation at level between right hip and knee, initial encounter: Secondary | ICD-10-CM

## 2021-09-24 NOTE — Progress Notes (Addendum)
Office Visit Note   Patient: Mary Long           Date of Birth: 05-09-1976           MRN: [id number] Visit Date: 09/24/2021              Requested by: No referring provider defined for this encounter. PCP: Patient, No Pcp Per (Inactive)  Chief Complaint  Patient presents with   Right Leg - Follow-up    Hx right BKA Needs new prosthetic      HPI: Patient is a 46 year old woman who presents for evaluation of her right residual limb she is status post right above-knee amputation July 2018 her current prosthesis is broken down she states that she also no longer fits into her socket she has been out of it for quite some time and has had no change in the volume of her residual limb.  She states she has seen her prosthetists and will require new set up and all new materials and supplies  The patient's current socket is 47 years old her C leg and foot are 47 years old.  Her socket is 8 ply is too big and falls off due to muscle atrophy.  The patient's knee does not work does not turn on due to electrical issues.  The knee is out of warranty and will cost more to repair then replaced cannot be used with the current issues that is having.  The foot is worn out and delaminating needs to be replaced to prevent catastrophic failure.  The patient prefers to complete her ADLs independently and walk around the computer immunity on uneven terrain she is unable to do these due to her socket falling off.  Assessment & Plan: Visit Diagnoses: No diagnosis found.  Plan: Given an order for new prosthesis set up to [facility] she will follow-up in the office as needed I will go ahead and order her physical therapy  Follow-Up Instructions: Return in about 2 weeks (around 10/08/2021).   Ortho Exam  Patient is alert, oriented, no adenopathy, well-dressed, normal affect, normal respiratory effort. On examination of the right residual limb her above-knee amputation is well-healed there is no erythema  no callus no impending skin breakdown  Imaging: No results found. No images are attached to the encounter.  Labs: Lab Results  Component Value Date   ESRSEDRATE 74 (H) 01/09/2017   ESRSEDRATE 74 (H) 01/06/2017   ESRSEDRATE 30 (H) 10/11/2011   CRP 1.8 (H) 01/09/2017   CRP 7.0 (H) 01/06/2017   REPTSTATUS 01/11/2017 FINAL 01/06/2017   REPTSTATUS 01/11/2017 FINAL 01/06/2017   GRAMSTAIN  01/06/2017    ABUNDANT WBC PRESENT,BOTH PMN AND MONONUCLEAR NO ORGANISMS SEEN    GRAMSTAIN  01/06/2017    ABUNDANT WBC PRESENT,BOTH PMN AND MONONUCLEAR NO ORGANISMS SEEN    CULT No growth aerobically or anaerobically. 01/06/2017   CULT No growth aerobically or anaerobically. 01/06/2017   LABORGA METHICILLIN RESISTANT STAPHYLOCOCCUS AUREUS 01/06/2017     Lab Results  Component Value Date   ALBUMIN 2.7 (L) 01/06/2017   ALBUMIN 2.7 (L) 02/06/2014   ALBUMIN 2.9 (L) 02/05/2014    No results found for: MG Lab Results  Component Value Date   VD25OH 44 02/04/2014    No results found for: PREALBUMIN CBC EXTENDED Latest Ref Rng & Units 01/09/2017 01/08/2017 01/07/2017  WBC 4.0 - 10.5 K/uL 5.0 5.5 6.2  RBC 3.87 - 5.11 MIL/uL 3.69(L) 3.58(L) 3.76(L)  HGB 12.0 - 15.0 g/dL 5.3(G)  8.1(L) 8.4(L)  HCT 36.0 - 46.0 % 27.5(L) 26.6(L) 28.0(L)  PLT 150 - 400 K/uL 596(H) 548(H) 552(H)  NEUTROABS 1.7 - 7.7 K/uL - - -  LYMPHSABS 0.7 - 4.0 K/uL - - -     There is no height or weight on file to calculate BMI.  Orders:  No orders of the defined types were placed in this encounter.  No orders of the defined types were placed in this encounter.    Procedures: No procedures performed  Clinical Data: No additional findings.  ROS:  All other systems negative, except as noted in the HPI. Review of Systems  Objective: Vital Signs: There were no vitals taken for this visit.  Specialty Comments:  No specialty comments available.  PMFS History: Patient Active Problem List   Diagnosis Date Noted    Above knee amputation status, right 01/30/2017   Staphylococcal arthritis of right knee (HCC)    Acute hematogenous osteomyelitis (HCC)    MRSA infection    Osteomyelitis of right femur (HCC) 01/06/2017   Fracture of distal femur (HCC) 12/18/2012   Past Medical History:  Diagnosis Date   Bipolar 1 disorder (HCC)    Blindness of left eye    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HCC) 02/03/2014   Complication of anesthesia    causes manic attacks and anxiety post anesthesia   Depression    Hep C w/o coma, chronic (HCC)    Illicit drug use    Osteomyelitis of right femur (HCC) 01/06/2017   Seizures (HCC)     History reviewed. No pertinent family history.  Past Surgical History:  Procedure Laterality Date   AMPUTATION Right 02/03/2014   Procedure: RIGHT AMPUTATION LEG THROUGH TIBIA/FIBULA;  Surgeon: Eulas Post, MD;  [facility];  Service: Orthopedics;  Laterality: Right;   AMPUTATION Right 01/12/2017   Procedure: RIGHT ABOVE KNEE AMPUTATION;  Surgeon: Nadara Mustard, MD;  Location: [facility];  Service: Orthopedics;  Laterality: Right;   APPLICATION OF WOUND VAC Right 01/06/2017   Procedure: APPLICATION OF WOUND VAC;  Surgeon: Teryl Lucy, MD;  [facility];  Service: Orthopedics;  Laterality: Right;   FRACTURE SURGERY     HARDWARE REMOVAL Right 01/06/2017   Procedure: HARDWARE REMOVAL RIGHT KNEE;  Surgeon: Teryl Lucy, MD;  [facility];  Service: Orthopedics;  Laterality: Right;   I&D of 4 abscesses to right arm and wrist  06/18/2003   IR FLUORO GUIDE CV LINE RIGHT  01/06/2017   IR US GUIDE VASC ACCESS RIGHT  01/06/2017   IRRIGATION AND DEBRIDEMENT KNEE Right 01/06/2017   Procedure: IRRIGATION AND DEBRIDEMENT RIGHT KNEE AND IRRIGATION AND DEBRIDEMENT BONE;  Surgeon: Teryl Lucy, MD;  [facility];  Service: Orthopedics;  Laterality: Right;   ORIF FEMUR FRACTURE Right 12/18/2012   Procedure: OPEN REDUCTION INTERNAL FIXATION (ORIF) DISTAL FEMUR FRACTURE;  Surgeon: Eulas Post, MD;  [facility];  Service: Orthopedics;  Laterality: Right;   TUBAL LIGATION  01/01/2001   Social History   Occupational History   Not on file  Tobacco Use   Smoking status: Every Day    Packs/day: 1.00    Types: Cigarettes   Smokeless tobacco: Never   Tobacco comments:    smokes electronic cigarettes  Substance and Sexual Activity   Alcohol use: No   Drug use: Yes    Types: Cocaine, Heroin    Comment: History of heroin and cocaine use; none for "years"   Sexual activity: Yes

## 2022-01-01 ENCOUNTER — Ambulatory Visit: Payer: Medicare Other | Admitting: Family

## 2022-04-25 ENCOUNTER — Telehealth: Payer: Self-pay | Admitting: Family

## 2022-04-25 ENCOUNTER — Inpatient Hospital Stay (HOSPITAL_COMMUNITY)
Admission: EM | Admit: 2022-04-25 | Discharge: 2022-04-26 | DRG: 812 | Discharge status: Against Medical Advice | Payer: Medicare Other | Source: Ambulatory Visit | Attending: Internal Medicine | Admitting: Internal Medicine

## 2022-04-25 ENCOUNTER — Other Ambulatory Visit: Payer: Self-pay

## 2022-04-25 ENCOUNTER — Emergency Department (HOSPITAL_COMMUNITY): Payer: Medicare Other

## 2022-04-25 DIAGNOSIS — Z1152 Encounter for screening for COVID-19: Secondary | ICD-10-CM

## 2022-04-25 DIAGNOSIS — B182 Chronic viral hepatitis C: Secondary | ICD-10-CM | POA: Diagnosis present

## 2022-04-25 DIAGNOSIS — D649 Anemia, unspecified: Secondary | ICD-10-CM | POA: Diagnosis not present

## 2022-04-25 DIAGNOSIS — Z886 Allergy status to analgesic agent status: Secondary | ICD-10-CM

## 2022-04-25 DIAGNOSIS — A419 Sepsis, unspecified organism: Secondary | ICD-10-CM | POA: Diagnosis present

## 2022-04-25 DIAGNOSIS — R6510 Systemic inflammatory response syndrome (SIRS) of non-infectious origin without acute organ dysfunction: Secondary | ICD-10-CM | POA: Diagnosis present

## 2022-04-25 DIAGNOSIS — Z89611 Acquired absence of right leg above knee: Secondary | ICD-10-CM

## 2022-04-25 DIAGNOSIS — T07XXXA Unspecified multiple injuries, initial encounter: Secondary | ICD-10-CM

## 2022-04-25 DIAGNOSIS — Z79899 Other long term (current) drug therapy: Secondary | ICD-10-CM

## 2022-04-25 DIAGNOSIS — Z5329 Procedure and treatment not carried out because of patient's decision for other reasons: Secondary | ICD-10-CM | POA: Diagnosis present

## 2022-04-25 MED ORDER — LACTATED RINGERS IV SOLN: INTRAVENOUS | Status: DC

## 2022-04-25 MED ORDER — LACTATED RINGERS IV BOLUS
500.0000 mL | Freq: Once | INTRAVENOUS | Status: AC
  Administered 2022-04-26: 500 mL via INTRAVENOUS

## 2022-04-25 MED ORDER — VANCOMYCIN HCL IN DEXTROSE 1-5 GM/200ML-% IV SOLN: 1000.0000 mg | Freq: Once | INTRAVENOUS | Status: DC

## 2022-04-25 MED ORDER — LACTATED RINGERS IV BOLUS (SEPSIS): 1000.0000 mL | Freq: Once | INTRAVENOUS | Status: DC

## 2022-04-25 MED ORDER — METRONIDAZOLE 500 MG/100ML IV SOLN: 500.0000 mg | Freq: Once | INTRAVENOUS | Status: DC

## 2022-04-25 MED ORDER — SODIUM CHLORIDE 0.9 % IV SOLN
2.0000 g | Freq: Once | INTRAVENOUS | Status: DC
  Filled 2022-04-25: qty 12.5

## 2022-04-25 NOTE — Progress Notes (Incomplete)
A consult was received from an ED physician for Vancomycin + Cefepime per pharmacy dosing.  The patient's profile has been reviewed for ht/wt/allergies/indication/available labs.   A one time order has been placed for Cefepime 2gm + Vancomycin 1gm.  Further antibiotics/pharmacy consults should be ordered by admitting physician if indicated.                       Thank you, Netta Cedars PharmD 04/25/2022  10:58 PM

## 2022-04-25 NOTE — ED Provider Notes (Signed)
[facility]-EMERGENCY DEPT Provider Note   CSN: [id number] Arrival date & time: 04/25/22  2210     History {Add pertinent medical, surgical, social history, OB history to HPI:1} Chief Complaint  Patient presents with   Abnormal Lab    Mary Long is a 46 y.o. female with a hx of bipolar 1 disorder, substance abuse, osteomyelitis, hepatitis C, and prior MRSA who presents to the ED with primary complaints of requesting a blood transfusion.  Patient states that she was called by her primary care provider and told her hemoglobin was low and that she likely needed a blood transfusion and to come to the ED.  Patient states this feels similar to when she has needed transfusions in the past with dyspnea, palpitations, and chest discomfort. She has a history of problems with anemia, she has not had any irregular vaginal bleeding or melena/hematochezia/bright red blood per rectum.  She reports overall poor condition of her health, she has multiple chronic wounds that been present to the extremities for years, she states she does see wound care, she had some increased drainage to them recently, she has not been on antibiotics this past month.  She also mentions a dry cough.  She denies nausea, vomiting, abdominal pain, diarrhea, or dysuria.  HPI     Home Medications Prior to Admission medications   Medication Sig Start Date End Date Taking? Authorizing Provider  ALPRAZolam Prudy Feeler) 1 MG tablet Take 1 mg by mouth 2 (two) times daily as needed for anxiety.     [provider]  amphetamine-dextroamphetamine (ADDERALL) 20 MG tablet Take 20 mg by mouth 2 (two) times daily as needed (for focus at work).     [provider]  cephALEXin (KEFLEX) 500 MG capsule TAKE ONE CAPSULE BY MOUTH 4 TIMES A DAY 01/02/17   [provider]  doxycycline (MONODOX) 100 MG capsule TAKE 1 TABLET(S) BY MOUTH 2 TIMES A DAY FOR 10 DAYS 01/02/17   [provider]   METHADONE HCL PO Take 80 mg by mouth daily.     [provider]  oxyCODONE-acetaminophen (ROXICET) 5-325 MG tablet Take 1 tablet by mouth every 4 (four) hours as needed for severe pain. 01/15/17   Nadara Mustard, MD  zolpidem (AMBIEN) 10 MG tablet Take 10 mg by mouth at bedtime as needed for sleep. For sleep    [provider]      Allergies    Tylenol [acetaminophen]    Review of Systems   Review of Systems  Constitutional:  Positive for fatigue. Negative for fever.  HENT:  Negative for ear pain and sore throat.   Respiratory:  Positive for cough and shortness of breath.   Cardiovascular:  Positive for chest pain and palpitations.  Gastrointestinal:  Negative for abdominal pain, diarrhea and vomiting.  Skin:  Positive for wound.  Neurological:  Positive for light-headedness.  All other systems reviewed and are negative.   Physical Exam Updated Vital Signs BP (!) 133/56   Pulse (!) 113   Temp 99.2 F (37.3 C) (Oral)   Resp 18   SpO2 100%  Physical Exam Vitals and nursing note reviewed.  Eyes:     Comments: Conjunctival pallor noted.  Cardiovascular:     Rate and Rhythm: Regular rhythm. Tachycardia present.  Pulmonary:     Effort: Pulmonary effort is normal.     Breath sounds: Normal breath sounds.  Abdominal:     Palpations: Abdomen is soft.  Tenderness: There is no abdominal tenderness. There is no guarding or rebound.  Genitourinary:    Comments: RN Wells Guiles present as chaperone. External hemorrhoids- no bleeding. DRE w/ no significant stool present.  Musculoskeletal:     Cervical back: Neck supple. No rigidity.     Comments: RLE: AKA  Wounds present to extremities x 4, minimal to distal aspect of RLE AKA site. More prominent to the upper extremities & LLE. Some drainage present. Surrounding erythema. Edema noted to the hands. Wounds pictured below.   Skin:    General: Skin is dry.  Neurological:     Mental Status: She is alert.    Left upper  extremity:    Left lower extremity:    Right upper extremity:    ED Results / Procedures / Treatments   Labs (all labs ordered are listed, but only abnormal results are displayed) Labs Reviewed - No data to display  EKG None  Radiology No results found.  Procedures Procedures  {Document cardiac monitor, telemetry assessment procedure when appropriate:1}  Medications Ordered in ED Medications - No data to display  ED Course/ Medical Decision Making/ A&P                           Medical Decision Making Amount and/or Complexity of Data Reviewed Labs: ordered.  Patient presents to the ED with primary concern of needing a blood transfusion.  On arrival oral temp 99.2, subsequently checked rectal hematuria this is 101.4, likely resultant tachycardia.  Concern for sepsis, code sepsis initiated with broad-spectrum antibiotics, however do favor source to be her chronic wounds.   Additional history obtained:  Chart/nursing notes reviewed***.  External records viewed including: ***  EKG: ***  Lab Tests:  I viewed & interpreted labs including:  ***  Imaging Studies:  I ordered and viewed the following imaging, agree with radiologist impression:  ***  ED Course:  I ordered medications including *** for ***  ***: RE-EVAL: ***   Based on patient's chief complaint, I considered admission might be necessary, however after reassuring ED workup feel patient is reasonable for discharge.   Critical Interventions: ***  Social determinants: *** Social determinants of health: this may be used for patients who have homelessness, poor access to medical care, need assistance with transition of care for placement, medication or transportation or follow up. You may also use the section for patients who struggle with substance abuse, have dementia, nursing home patients, patients who are severely disabled or have some degree of mental retardation and are not able to fully participate  in their care.   Portions of this note were generated with [employer]. Dictation errors may occur despite best attempts at proofreading.   {Document critical care time when appropriate:1} {Document review of labs and clinical decision tools ie heart score, Chads2Vasc2 etc:1}  {Document your independent review of radiology images, and any outside records:1} {Document your discussion with family members, caretakers, and with consultants:1} {Document social determinants of health affecting pt's care:1} {Document your decision making why or why not admission, treatments were needed:1} Final Clinical Impression(s) / ED Diagnoses Final diagnoses:  None    Rx / DC Orders ED Discharge Orders     None

## 2022-04-25 NOTE — Telephone Encounter (Signed)
Holding for [place]

## 2022-04-25 NOTE — Progress Notes (Signed)
A consult was received from an ED physician for Vancomycin + Cefepime per pharmacy dosing.  The patient's profile has been reviewed for ht/wt/allergies/indication/available labs.    No weight in computer- requested RN document patient weight.  A one time order has been placed for Cefepime 2gm + Vancomycin 1gm.  Further antibiotics/pharmacy consults should be ordered by admitting physician if indicated.                       Thank you, Netta Cedars PharmD 04/25/2022  10:58 PM

## 2022-04-25 NOTE — ED Notes (Addendum)
Pt came here with open wounds to arms bilaterally (some with dressings, some open to air), open wounds to left leg. Pt has right AKA. Kennith Maes, PA aware.

## 2022-04-25 NOTE — Telephone Encounter (Signed)
[facility] called in stating they need a Rx for patient prosthetic [phone] right above the knee, and have it dated and signed and clinical notes supporting Rx please advise fax [phone] Gae Bon office number is [phone]

## 2022-04-25 NOTE — ED Notes (Signed)
Provider aware peripheral IV will be difficult due to patients extensive wounds.

## 2022-04-25 NOTE — ED Triage Notes (Addendum)
Pt states that she was told to come here per her pcp for low hemoglobin. Pt has open wounds to both arms. Pt is complaining of SHOB.

## 2022-04-25 NOTE — Progress Notes (Signed)
Pt being followed by [facility] for Sepsis protocol. 

## 2022-04-26 ENCOUNTER — Other Ambulatory Visit (HOSPITAL_COMMUNITY): Payer: Medicare Other

## 2022-04-26 DIAGNOSIS — R6510 Systemic inflammatory response syndrome (SIRS) of non-infectious origin without acute organ dysfunction: Secondary | ICD-10-CM | POA: Diagnosis present

## 2022-04-26 DIAGNOSIS — A419 Sepsis, unspecified organism: Secondary | ICD-10-CM | POA: Diagnosis present

## 2022-04-26 DIAGNOSIS — Z886 Allergy status to analgesic agent status: Secondary | ICD-10-CM | POA: Diagnosis not present

## 2022-04-26 DIAGNOSIS — Z79899 Other long term (current) drug therapy: Secondary | ICD-10-CM | POA: Diagnosis not present

## 2022-04-26 DIAGNOSIS — Z89611 Acquired absence of right leg above knee: Secondary | ICD-10-CM | POA: Diagnosis not present

## 2022-04-26 DIAGNOSIS — D649 Anemia, unspecified: Principal | ICD-10-CM

## 2022-04-26 DIAGNOSIS — B182 Chronic viral hepatitis C: Secondary | ICD-10-CM | POA: Diagnosis present

## 2022-04-26 DIAGNOSIS — Z1152 Encounter for screening for COVID-19: Secondary | ICD-10-CM | POA: Diagnosis not present

## 2022-04-26 DIAGNOSIS — Z5329 Procedure and treatment not carried out because of patient's decision for other reasons: Secondary | ICD-10-CM | POA: Diagnosis present

## 2022-04-26 LAB — CBC WITH DIFFERENTIAL/PLATELET
Basophils Absolute: 0 10*3/uL (ref 0.0–0.1)
Basophils Relative: 0 %
Eosinophils Absolute: 0.1 10*3/uL (ref 0.0–0.5)
Eosinophils Relative: 1 %
HCT: 9.9 % — ABNORMAL LOW (ref 36.0–46.0)
Hemoglobin: 2.2 g/dL — CL (ref 12.0–15.0)
Immature Granulocytes: 0 %
Lymphocytes Relative: 19 %
Lymphs Abs: 1.9 10*3/uL (ref 0.7–4.0)
MCH: 13.8 pg — ABNORMAL LOW (ref 26.0–34.0)
MCHC: 22.2 g/dL — ABNORMAL LOW (ref 30.0–36.0)
MCV: 62.3 fL — ABNORMAL LOW (ref 80.0–100.0)
Monocytes Absolute: 0.8 10*3/uL (ref 0.1–1.0)
Monocytes Relative: 8 %
Neutro Abs: 7 10*3/uL (ref 1.7–7.7)
Neutrophils Relative %: 71 %
Platelets: 611 10*3/uL — ABNORMAL HIGH (ref 150–400)
RBC: 1.29 MIL/uL — ABNORMAL LOW (ref 3.87–5.11)
RDW: 26.4 % — ABNORMAL HIGH (ref 11.5–15.5)
WBC Morphology: ABNORMAL
WBC: 9.8 10*3/uL (ref 4.0–10.5)
nRBC: 1 /100 WBC — ABNORMAL HIGH

## 2022-04-26 LAB — COMPREHENSIVE METABOLIC PANEL
ALT: 8 U/L (ref 0–44)
ALT: 8 U/L (ref 0–44)
AST: 20 U/L (ref 15–41)
AST: 20 U/L (ref 15–41)
Albumin: 2.2 g/dL — ABNORMAL LOW (ref 3.5–5.0)
Albumin: 2 g/dL — ABNORMAL LOW (ref 3.5–5.0)
Alkaline Phosphatase: 93 U/L (ref 38–126)
Alkaline Phosphatase: 84 U/L (ref 38–126)
Anion gap: 7 (ref 5–15)
Anion gap: 4 — ABNORMAL LOW (ref 5–15)
BUN: 8 mg/dL (ref 6–20)
BUN: 5 mg/dL — ABNORMAL LOW (ref 6–20)
CO2: 24 mmol/L (ref 22–32)
CO2: 25 mmol/L (ref 22–32)
Calcium: 7.5 mg/dL — ABNORMAL LOW (ref 8.9–10.3)
Calcium: 7.3 mg/dL — ABNORMAL LOW (ref 8.9–10.3)
Chloride: 107 mmol/L (ref 98–111)
Chloride: 111 mmol/L (ref 98–111)
Creatinine, Ser: 0.48 mg/dL (ref 0.44–1.00)
Creatinine, Ser: 0.32 mg/dL — ABNORMAL LOW (ref 0.44–1.00)
GFR, Estimated: >60 mL/min (ref >60)
GFR, Estimated: >60 mL/min (ref >60)
Glucose, Bld: 86 mg/dL (ref 70–99)
Glucose, Bld: 86 mg/dL (ref 70–99)
Potassium: 3.7 mmol/L (ref 3.5–5.1)
Potassium: 3.2 mmol/L — ABNORMAL LOW (ref 3.5–5.1)
Sodium: 138 mmol/L (ref 135–145)
Sodium: 140 mmol/L (ref 135–145)
Total Bilirubin: 0.4 mg/dL (ref 0.3–1.2)
Total Bilirubin: 0.3 mg/dL (ref 0.3–1.2)
Total Protein: 7.1 g/dL (ref 6.5–8.1)
Total Protein: 6.7 g/dL (ref 6.5–8.1)

## 2022-04-26 LAB — PROTIME-INR
INR: 1.3 — ABNORMAL HIGH (ref 0.8–1.2)
Prothrombin Time: 16.1 seconds — ABNORMAL HIGH (ref 11.4–15.2)

## 2022-04-26 LAB — RETICULOCYTES
Immature Retic Fract: 22.2 % — ABNORMAL HIGH (ref 2.3–15.9)
RBC.: 2.1 MIL/uL — ABNORMAL LOW (ref 3.87–5.11)
Retic Count, Absolute: 12.5 10*3/uL — ABNORMAL LOW (ref 19.0–186.0)
Retic Ct Pct: 0.6 % (ref 0.4–3.1)

## 2022-04-26 LAB — CBC
HCT: 14.2 % — ABNORMAL LOW (ref 36.0–46.0)
Hemoglobin: 3.7 g/dL — CL (ref 12.0–15.0)
MCH: 17.4 pg — ABNORMAL LOW (ref 26.0–34.0)
MCHC: 26.1 g/dL — ABNORMAL LOW (ref 30.0–36.0)
MCV: 66.7 fL — ABNORMAL LOW (ref 80.0–100.0)
Platelets: 671 10*3/uL — ABNORMAL HIGH (ref 150–400)
RBC: 2.13 MIL/uL — ABNORMAL LOW (ref 3.87–5.11)
RDW: 31.9 % — ABNORMAL HIGH (ref 11.5–15.5)
WBC: 6.8 10*3/uL (ref 4.0–10.5)
nRBC: 0.6 % — ABNORMAL HIGH (ref 0.0–0.2)

## 2022-04-26 LAB — PREPARE RBC (CROSSMATCH)

## 2022-04-26 LAB — LACTIC ACID, PLASMA
Lactic Acid, Venous: 1.8 mmol/L (ref 0.5–1.9)
Lactic Acid, Venous: 1.2 mmol/L (ref 0.5–1.9)

## 2022-04-26 LAB — RESP PANEL BY RT-PCR (FLU A&B, COVID) ARPGX2
Influenza A by PCR: NEGATIVE
Influenza B by PCR: NEGATIVE
SARS Coronavirus 2 by RT PCR: NEGATIVE

## 2022-04-26 LAB — TROPONIN I (HIGH SENSITIVITY)
Troponin I (High Sensitivity): 5 ng/L (ref <18)
Troponin I (High Sensitivity): 7 ng/L (ref <18)

## 2022-04-26 LAB — I-STAT BETA HCG BLOOD, ED (MC, WL, AP ONLY): I-stat hCG, quantitative: <5 m[IU]/mL (ref <5)

## 2022-04-26 LAB — APTT: aPTT: 37 seconds — ABNORMAL HIGH (ref 24–36)

## 2022-04-26 MED ORDER — SODIUM CHLORIDE 0.9 % IV SOLN: 10.0000 mL/h | Freq: Once | INTRAVENOUS | Status: DC

## 2022-04-26 MED ORDER — HYDROMORPHONE HCL 1 MG/ML IJ SOLN
0.5000 mg | Freq: Once | INTRAMUSCULAR | Status: AC
  Administered 2022-04-26: 0.5 mg via INTRAVENOUS
  Filled 2022-04-26: qty 1

## 2022-04-26 NOTE — ED Notes (Signed)
Rancour, MD at bedside to reeducate pt on importance of staying at the hospital overnight. Pt stated she needs time to think about it.

## 2022-04-26 NOTE — ED Notes (Signed)
MD at bedside. 

## 2022-04-26 NOTE — ED Provider Notes (Signed)
Dr. Marylyn Ishihara has seen patient.  She has refused to be admitted.  She is agreeable to blood transfusion which was provided.  Hemoglobin only Improved to 3.7 from 2.2.  Discussed with her that this is life-threatening anemia that can be potentially fatal if not treated and further investigated.  Patient states she is followed up with her doctors as an outpatient and is being worked up for her anemia and does not want to be admitted to the hospital.  She is not suicidal homicidal.  She appears to have capacity to make this decision.  Patient vacillated on whether she would stay or not multiple times before ultimately deciding to leave [place].  Multiple nursing staff, myself, Dr. Marylyn Ishihara at bedside unable to give this patient to stay for further treatment and she will leave [place].   Ezequiel Essex, MD 04/26/22 1354

## 2022-04-26 NOTE — ED Notes (Signed)
IV removed at bedside, AMA papers signed and witnessed. MD aware

## 2022-04-26 NOTE — ED Notes (Signed)
This RN at bedside. Pt tearful, requesting to leave. Reports we are not giving her her methadone prescription & reports she needs to leave due to that. This RN informed pt I could reach out to hospitalist to see if we could get script/dose for her. Pt refused & stated she just wanted to leave. Pt educated on risks of leaving. Pt verbalized understanding.

## 2022-04-26 NOTE — Progress Notes (Signed)
I went to see the patient for admission to the hospital. She informed me that she does not want to be admitted. She would like to have blood transfused and then go home. I discussed our concerns with her acute health. She acknowledged my concerns and stated that she has good outpatient follow up with a plan. She again stated that she just needs a transfusion and does not want to be admitted. She notes that she is aware of the possibilities of negative outcomes and is willing to take the consequences for her action. I have notified the ED team that she does not want to be admitted. Call us back if she changes her mind. Thank you.   Jonnie Finner, DO

## 2022-04-26 NOTE — ED Notes (Signed)
Pt continues to refuse medical treatment with the exception of blood transfusion.

## 2022-04-26 NOTE — ED Notes (Signed)
Pt stated she does not want to stay here anymore and does not want treatment

## 2022-04-26 NOTE — ED Notes (Signed)
Pt continues to yell out requesting to leave the hospital

## 2022-04-29 NOTE — Telephone Encounter (Signed)
Sent to [facility]

## 2022-04-29 NOTE — Telephone Encounter (Signed)
Rx pending MD signature. 

## 2022-04-30 LAB — TYPE AND SCREEN
ABO/RH(D): A POS
Antibody Screen: NEGATIVE
Unit division: 0
Unit division: 0
Unit division: 0
Unit division: 0

## 2022-04-30 LAB — BPAM RBC
Blood Product Expiration Date: 202311092359
Blood Product Expiration Date: 202311092359
Blood Product Expiration Date: 202311092359
Blood Product Expiration Date: 202311092359
ISSUE DATE / TIME: 202310140255
ISSUE DATE / TIME: 202310141144
Unit Type and Rh: 6200
Unit Type and Rh: 6200
Unit Type and Rh: 6200
Unit Type and Rh: 6200

## 2022-05-01 LAB — CULTURE, BLOOD (ROUTINE X 2)
Culture: NO GROWTH
Special Requests: ADEQUATE

## 2022-05-07 ENCOUNTER — Telehealth: Payer: Self-pay | Admitting: Family

## 2022-05-07 NOTE — Telephone Encounter (Signed)
Mary Long  from  [employer] [phone] needs the script send over from 09/24/2021 for patient.

## 2022-05-07 NOTE — Telephone Encounter (Signed)
[facility] [place], they have our notes that we sent from 04/29/22 but she said it had a different doctors name on the Rx other than Erin's and stated that they couldn't take that Rx. I let her know that Dr. Sharol Given is the referring provider and Junie Panning works underneath him in the office and shares the same pt's. She will send that info to their compliance team. No further action needed.

## 2022-05-07 NOTE — Telephone Encounter (Signed)
I have faxed them this at that most recent request. I will refax.

## 2022-05-14 NOTE — Telephone Encounter (Signed)
Lonn Georgia from [facility]  [phone] stated they needed for Erin to Sign and date and put the word Addendum next to her addendum, please advise

## 2022-05-15 ENCOUNTER — Telehealth: Payer: Self-pay | Admitting: Family

## 2022-05-15 NOTE — Telephone Encounter (Signed)
Addendum to 09/24/21 ov note faxed [employer] [phone], ph [phone]

## 2022-05-20 ENCOUNTER — Telehealth: Payer: Self-pay | Admitting: Family

## 2022-05-20 NOTE — Telephone Encounter (Signed)
Kayla from [employer] called states she need a call back from [place] [facility] [place] About some documents/notes that need to be fixed. Kayla need to discuss what she need PA Junie Panning to correct and initial and fax back. Please call Kayla at [phone].

## 2022-05-21 NOTE — Telephone Encounter (Signed)
Note fixed and faxed to [facility] at [phone]

## 2022-05-21 NOTE — Telephone Encounter (Signed)
Called Kayla back, she was not in office at this time as she was at lunch. I told the operator that I would call back later on today.

## 2022-05-29 NOTE — Telephone Encounter (Signed)
Found the form in Dr.Duda's folder. I have placed this on Erin's desk for her to complete.

## 2022-05-29 NOTE — Telephone Encounter (Signed)
Kayla From [employer] called in requesting an updated of a standard order that was faxed over on Nov 9th... Kayla requesting callback at [phone].Marland KitchenMarland Kitchen

## 2022-09-23 ENCOUNTER — Ambulatory Visit: Payer: Medicare Other | Admitting: Family

## 2022-09-25 ENCOUNTER — Encounter: Payer: Self-pay | Admitting: Orthopedic Surgery

## 2022-09-25 ENCOUNTER — Ambulatory Visit (INDEPENDENT_AMBULATORY_CARE_PROVIDER_SITE_OTHER): Payer: Medicare Other | Admitting: Orthopedic Surgery

## 2022-09-25 DIAGNOSIS — Z89611 Acquired absence of right leg above knee: Secondary | ICD-10-CM

## 2022-09-25 DIAGNOSIS — S78111A Complete traumatic amputation at level between right hip and knee, initial encounter: Secondary | ICD-10-CM

## 2022-09-25 DIAGNOSIS — T8781 Dehiscence of amputation stump: Secondary | ICD-10-CM

## 2022-09-25 NOTE — Progress Notes (Signed)
Office Visit Note   Patient: Mary Long           Date of Birth: [birth year]           MRN: [id number] Visit Date: 09/25/2022              Requested by: No referring provider defined for this encounter. 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  Patient states that the bone is sticking out and this just happened.  She denies any pain or redness.  Patient states that she received a socket let us prosthesis in [place].  Assessment & Plan: Visit Diagnoses:  1. Above knee amputation of right lower extremity ([place])   2. Dehiscence of amputation stump ([place])     Plan: Will plan for revision of the right above-the-knee amputation.  Patient will need a new socket.  Follow-Up Instructions: No follow-ups on file.   Ortho Exam  Patient is alert, oriented, no adenopathy, well-dressed, normal affect, normal respiratory effort. Examination patient has swelling in both lower extremities.  There is a large area of wound dehiscence of the right above-knee amputation there is exposed bone fibrinous exudative tissue.  There is no purulent drainage there is no cellulitis.  Imaging: No results found.   Labs: Lab Results  Component Value Date   ESRSEDRATE 74 (H) 01/09/2017   ESRSEDRATE 74 (H) 01/06/2017   ESRSEDRATE 30 (H) 10/11/2011   CRP 1.8 (H) 01/09/2017   CRP 7.0 (H) 01/06/2017   REPTSTATUS 05/01/2022 FINAL 04/26/2022   GRAMSTAIN  01/06/2017    ABUNDANT WBC PRESENT,BOTH PMN AND MONONUCLEAR NO ORGANISMS SEEN    GRAMSTAIN  01/06/2017    ABUNDANT WBC PRESENT,BOTH PMN AND MONONUCLEAR NO ORGANISMS SEEN    CULT  04/26/2022    NO GROWTH 5 DAYS Performed at [facility], [place] [address] [place], [place] [address]    LABORGA METHICILLIN RESISTANT STAPHYLOCOCCUS AUREUS 01/06/2017     Lab Results   Component Value Date   ALBUMIN 2.0 (L) 04/26/2022   ALBUMIN 2.2 (L) 04/25/2022   ALBUMIN 2.7 (L) 01/06/2017    No results found for: "MG" Lab Results  Component Value Date   VD25OH 44 02/04/2014    No results found for: "PREALBUMIN"    Latest Ref Rng & Units 04/26/2022   11:27 AM 04/25/2022   11:23 PM 04/25/2022    9:24 AM  CBC EXTENDED  WBC 4.0 - 10.5 K/uL 6.8  9.8    RBC 3.87 - 5.11 MIL/uL 2.13  1.29  2.10   Hemoglobin 12.0 - 15.0 g/dL 3.7  2.2    HCT 36.0 - 46.0 % 14.2  9.9    Platelets 150 - 400 K/uL 671  611    NEUT# 1.7 - 7.7 K/uL  7.0    Lymph# 0.7 - 4.0 K/uL  1.9       There is no height or weight on file to calculate BMI.  Orders:  No orders of the defined types were placed in this encounter.  No orders of the defined types were placed in this encounter.    Procedures: No procedures performed  Clinical Data: No additional findings.  ROS:  All other systems negative, except as noted in the HPI. Review of Systems  Objective: Vital Signs: There  were no vitals taken for this visit.  Specialty Comments:  No specialty comments available.  PMFS History: Patient Active Problem List   Diagnosis Date Noted   Anemia 04/26/2022   Sepsis (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 No pertinent family history.  Past Surgical History:  Procedure  Laterality Date   AMPUTATION Right 02/03/2014   Procedure: RIGHT AMPUTATION LEG THROUGH TIBIA/FIBULA;  Surgeon: Johnny Bridge, MD;  Location: [facility];  Service: Orthopedics;  Laterality: Right;   AMPUTATION Right 01/12/2017   Procedure: RIGHT ABOVE KNEE AMPUTATION;  Surgeon: Newt Minion, MD;  Location: [facility];  Service: Orthopedics;  Laterality: Right;   APPLICATION OF WOUND VAC Right 01/06/2017   Procedure: APPLICATION OF WOUND VAC;  Surgeon: Marchia Bond, MD;  Location: [facility];  Service: Orthopedics;  Laterality: Right;   FRACTURE SURGERY     HARDWARE REMOVAL Right 01/06/2017   Procedure: HARDWARE REMOVAL RIGHT KNEE;  Surgeon: Marchia Bond, MD;  Location: [facility];  Service: Orthopedics;  Laterality: Right;   I&D of 4 abscesses to right arm and wrist  06/18/2003   IR FLUORO GUIDE CV LINE RIGHT  01/06/2017   IR US GUIDE VASC ACCESS RIGHT  01/06/2017   IRRIGATION AND DEBRIDEMENT KNEE Right 01/06/2017   Procedure: IRRIGATION AND DEBRIDEMENT RIGHT KNEE AND IRRIGATION AND DEBRIDEMENT BONE;  Surgeon: Marchia Bond, MD;  Location: [facility];  Service: Orthopedics;  Laterality: Right;   ORIF FEMUR FRACTURE Right 12/18/2012   Procedure: OPEN REDUCTION INTERNAL FIXATION (ORIF) DISTAL FEMUR FRACTURE;  Surgeon: Johnny Bridge, MD;  Location: [facility];  Service: Orthopedics;  Laterality: Right;   TUBAL LIGATION  01/01/2001   Social History   Occupational History   Not on file  Tobacco Use   Smoking status: Every Day    Packs/day: 1    Types: Cigarettes   Smokeless tobacco: Never   Tobacco comments:    smokes electronic cigarettes  Substance and Sexual Activity   Alcohol use: No   Drug use: Yes    Types: Cocaine, Heroin    Comment: History of heroin and cocaine use; none for "years"   Sexual activity: Yes

## 2022-09-30 ENCOUNTER — Ambulatory Visit: Payer: Medicare Other | Admitting: Family

## 2022-10-02 ENCOUNTER — Encounter (HOSPITAL_COMMUNITY): Payer: Self-pay | Admitting: Vascular Surgery

## 2022-10-03 ENCOUNTER — Inpatient Hospital Stay (HOSPITAL_COMMUNITY): Admission: RE | Admit: 2022-10-03 | Payer: Medicare Other | Source: Ambulatory Visit | Admitting: Orthopedic Surgery

## 2022-10-03 ENCOUNTER — Encounter (HOSPITAL_COMMUNITY): Admission: RE | Payer: Self-pay | Source: Ambulatory Visit

## 2022-10-03 SURGERY — REVISION, AMPUTATION SITE
Anesthesia: Choice | Laterality: Right

## 2022-10-06 ENCOUNTER — Telehealth: Payer: Self-pay

## 2022-10-06 NOTE — Telephone Encounter (Signed)
Notify pt about plan for admission/ transfusion and surgery while for for labs. See if Sherri can sch while pt is here.

## 2022-10-06 NOTE — Telephone Encounter (Signed)
-----   Message from Pamella Pert, [place] sent at 10/06/2022  9:17 AM EDT ----- Regarding: RE: monitor Called pt and she will come in for CBC tomorrow at 1 pm ----- Message ----- From: Pamella Pert, RMA Sent: 10/03/2022   2:52 PM EDT To: Pamella Pert, RMA Subject: FW: monitor                                    Pt surgery as cx will need a nurse only visit to check a CBC and then will have to admit on a Tuesday for transfusion for surgery on Wednesday or admit Thursday for surgery on Friday. Will check with Malachy Mood about this.  ----- Message ----- From: Pamella Pert, RMA Sent: 10/02/2022   1:44 PM EDT To: Pamella Pert, RMA Subject: monitor                                        Revision AKA 10/03/2022 monitor for graft and d/c

## 2022-10-06 NOTE — Telephone Encounter (Signed)
Also add on a cmet

## 2022-10-07 ENCOUNTER — Ambulatory Visit: Payer: Medicare Other

## 2022-10-07 DIAGNOSIS — T8781 Dehiscence of amputation stump: Secondary | ICD-10-CM

## 2022-10-07 NOTE — Telephone Encounter (Signed)
Labs obtained today and will have Erin review results tomorrow. Pt is agreeable to plan and was advised we will call to set up her procedures after results are in.

## 2022-10-08 ENCOUNTER — Other Ambulatory Visit: Payer: Self-pay | Admitting: Orthopaedic Surgery

## 2022-10-08 LAB — COMPREHENSIVE METABOLIC PANEL
AG Ratio: 0.6 (calc) — ABNORMAL LOW (ref 1.0–2.5)
ALT: 5 U/L — ABNORMAL LOW (ref 6–29)
AST: 13 U/L (ref 10–35)
Albumin: 3 g/dL — ABNORMAL LOW (ref 3.6–5.1)
Alkaline phosphatase (APISO): 109 U/L (ref 31–125)
BUN/Creatinine Ratio: 19 (calc) (ref 6–22)
BUN: 7 mg/dL (ref 7–25)
CO2: 25 mmol/L (ref 20–32)
Calcium: 7.8 mg/dL — ABNORMAL LOW (ref 8.6–10.2)
Chloride: 102 mmol/L (ref 98–110)
Creat: 0.36 mg/dL — ABNORMAL LOW (ref 0.50–0.99)
Globulin: 4.7 g/dL (calc) — ABNORMAL HIGH (ref 1.9–3.7)
Glucose, Bld: 73 mg/dL (ref 65–99)
Potassium: 4.1 mmol/L (ref 3.5–5.3)
Sodium: 135 mmol/L (ref 135–146)
Total Bilirubin: 0.3 mg/dL (ref 0.2–1.2)
Total Protein: 7.7 g/dL (ref 6.1–8.1)

## 2022-10-08 LAB — CBC WITH DIFFERENTIAL/PLATELET
Absolute Monocytes: 454 cells/uL (ref 200–950)
Basophils Absolute: 39 cells/uL (ref 0–200)
Basophils Relative: 0.7 %
Eosinophils Absolute: 39 cells/uL (ref 15–500)
Eosinophils Relative: 0.7 %
HCT: 11.7 % — ABNORMAL LOW (ref 35.0–45.0)
Hemoglobin: 2.3 g/dL — CL (ref 11.7–15.5)
Lymphs Abs: 1562 cells/uL (ref 850–3900)
MCH: <15 pg — ABNORMAL LOW (ref 27.0–33.0)
MCHC: 19.7 g/dL — ABNORMAL LOW (ref 32.0–36.0)
MCV: 58.2 fL — ABNORMAL LOW (ref 80.0–100.0)
MPV: 10.9 fL (ref 7.5–12.5)
Monocytes Relative: 8.1 %
Neutro Abs: 3506 cells/uL (ref 1500–7800)
Neutrophils Relative %: 62.6 %
Platelets: 654 10*3/uL — ABNORMAL HIGH (ref 140–400)
RBC: 2.01 10*6/uL — ABNORMAL LOW (ref 3.80–5.10)
RDW: 24.5 % — ABNORMAL HIGH (ref 11.0–15.0)
Total Lymphocyte: 27.9 %
WBC: 5.6 10*3/uL (ref 3.8–10.8)

## 2022-10-08 NOTE — Telephone Encounter (Signed)
Called lab to question why test not run and was advised they though the blood was contaminated because the HGB result was so low. Advised that we were anticipating that it was going to be low and was advised by phone that it was 2.3. Junie Panning called pt and advised that she should proceed to the ER now for transfusion. Awaiting final result in the chart from the lab they advised that result will push through shortly. Pt advised to go to [facility] or [facility] for this and advise of the verbal 2.3 result that was given and advised that they will recheck upon arrival.

## 2022-10-08 NOTE — Telephone Encounter (Signed)
Blood work still pending. Will continue to monitor to set up for surgery next week.

## 2022-10-10 ENCOUNTER — Other Ambulatory Visit: Payer: Self-pay

## 2022-10-10 ENCOUNTER — Inpatient Hospital Stay (HOSPITAL_COMMUNITY)
Admission: EM | Admit: 2022-10-10 | Discharge: 2022-10-10 | DRG: 812 | Discharge status: Against Medical Advice | Payer: Medicare Other | Attending: Internal Medicine | Admitting: Internal Medicine

## 2022-10-10 DIAGNOSIS — T8130XA Disruption of wound, unspecified, initial encounter: Secondary | ICD-10-CM | POA: Diagnosis present

## 2022-10-10 DIAGNOSIS — Z5329 Procedure and treatment not carried out because of patient's decision for other reasons: Secondary | ICD-10-CM | POA: Diagnosis present

## 2022-10-10 DIAGNOSIS — T148XXA Other injury of unspecified body region, initial encounter: Secondary | ICD-10-CM | POA: Diagnosis present

## 2022-10-10 DIAGNOSIS — Y838 Other surgical procedures as the cause of abnormal reaction of the patient, or of later complication, without mention of misadventure at the time of the procedure: Secondary | ICD-10-CM | POA: Diagnosis present

## 2022-10-10 DIAGNOSIS — D500 Iron deficiency anemia secondary to blood loss (chronic): Principal | ICD-10-CM | POA: Diagnosis present

## 2022-10-10 DIAGNOSIS — Z6821 Body mass index (BMI) 21.0-21.9, adult: Secondary | ICD-10-CM

## 2022-10-10 DIAGNOSIS — D638 Anemia in other chronic diseases classified elsewhere: Secondary | ICD-10-CM | POA: Diagnosis present

## 2022-10-10 DIAGNOSIS — E876 Hypokalemia: Secondary | ICD-10-CM | POA: Diagnosis present

## 2022-10-10 DIAGNOSIS — H5462 Unqualified visual loss, left eye, normal vision right eye: Secondary | ICD-10-CM | POA: Diagnosis present

## 2022-10-10 DIAGNOSIS — B182 Chronic viral hepatitis C: Secondary | ICD-10-CM | POA: Diagnosis present

## 2022-10-10 DIAGNOSIS — F17210 Nicotine dependence, cigarettes, uncomplicated: Secondary | ICD-10-CM | POA: Diagnosis present

## 2022-10-10 DIAGNOSIS — Z89611 Acquired absence of right leg above knee: Secondary | ICD-10-CM | POA: Diagnosis not present

## 2022-10-10 DIAGNOSIS — R64 Cachexia: Secondary | ICD-10-CM | POA: Diagnosis present

## 2022-10-10 DIAGNOSIS — F419 Anxiety disorder, unspecified: Secondary | ICD-10-CM | POA: Diagnosis present

## 2022-10-10 DIAGNOSIS — T8131XA Disruption of external operation (surgical) wound, not elsewhere classified, initial encounter: Secondary | ICD-10-CM | POA: Diagnosis present

## 2022-10-10 DIAGNOSIS — M86251 Subacute osteomyelitis, right femur: Secondary | ICD-10-CM | POA: Diagnosis not present

## 2022-10-10 DIAGNOSIS — E872 Acidosis, unspecified: Secondary | ICD-10-CM | POA: Diagnosis present

## 2022-10-10 DIAGNOSIS — F319 Bipolar disorder, unspecified: Secondary | ICD-10-CM | POA: Diagnosis present

## 2022-10-10 DIAGNOSIS — D649 Anemia, unspecified: Secondary | ICD-10-CM | POA: Diagnosis not present

## 2022-10-10 DIAGNOSIS — T8781 Dehiscence of amputation stump: Secondary | ICD-10-CM | POA: Diagnosis not present

## 2022-10-10 LAB — CBC
HCT: 21.7 % — ABNORMAL LOW (ref 36.0–46.0)
Hemoglobin: 6.3 g/dL — CL (ref 12.0–15.0)
MCH: 21 pg — ABNORMAL LOW (ref 26.0–34.0)
MCHC: 29 g/dL — ABNORMAL LOW (ref 30.0–36.0)
MCV: 72.3 fL — ABNORMAL LOW (ref 80.0–100.0)
Platelets: 359 10*3/uL (ref 150–400)
RBC: 3 MIL/uL — ABNORMAL LOW (ref 3.87–5.11)
WBC: 5 10*3/uL (ref 4.0–10.5)
nRBC: 0.8 % — ABNORMAL HIGH (ref 0.0–0.2)

## 2022-10-10 LAB — BASIC METABOLIC PANEL
Anion gap: 7 (ref 5–15)
BUN: 5 mg/dL — ABNORMAL LOW (ref 6–20)
CO2: 22 mmol/L (ref 22–32)
Calcium: 7.5 mg/dL — ABNORMAL LOW (ref 8.9–10.3)
Chloride: 104 mmol/L (ref 98–111)
Creatinine, Ser: 0.4 mg/dL — ABNORMAL LOW (ref 0.44–1.00)
GFR, Estimated: >60 mL/min (ref >60)
Glucose, Bld: 77 mg/dL (ref 70–99)
Potassium: 4 mmol/L (ref 3.5–5.1)
Sodium: 133 mmol/L — ABNORMAL LOW (ref 135–145)

## 2022-10-10 LAB — MRSA NEXT GEN BY PCR, NASAL: MRSA by PCR Next Gen: NOT DETECTED

## 2022-10-10 LAB — CBC WITH DIFFERENTIAL/PLATELET
Abs Immature Granulocytes: 0.02 10*3/uL (ref 0.00–0.07)
Basophils Absolute: 0 10*3/uL (ref 0.0–0.1)
Basophils Relative: 0 %
Eosinophils Absolute: 0 10*3/uL (ref 0.0–0.5)
Eosinophils Relative: 1 %
HCT: 9.5 % — ABNORMAL LOW (ref 36.0–46.0)
Hemoglobin: 2.1 g/dL — CL (ref 12.0–15.0)
Immature Granulocytes: 0 %
Lymphocytes Relative: 19 %
Lymphs Abs: 0.9 10*3/uL (ref 0.7–4.0)
MCH: 12.5 pg — ABNORMAL LOW (ref 26.0–34.0)
MCHC: 22.1 g/dL — ABNORMAL LOW (ref 30.0–36.0)
MCV: 56.5 fL — ABNORMAL LOW (ref 80.0–100.0)
Monocytes Absolute: 0.4 10*3/uL (ref 0.1–1.0)
Monocytes Relative: 7 %
Neutro Abs: 3.6 10*3/uL (ref 1.7–7.7)
Neutrophils Relative %: 73 %
Platelets: 449 10*3/uL — ABNORMAL HIGH (ref 150–400)
RBC: 1.68 MIL/uL — ABNORMAL LOW (ref 3.87–5.11)
RDW: 23.5 % — ABNORMAL HIGH (ref 11.5–15.5)
WBC: 5 10*3/uL (ref 4.0–10.5)
nRBC: 1 % — ABNORMAL HIGH (ref 0.0–0.2)

## 2022-10-10 LAB — FOLATE: Folate: 6.8 ng/mL (ref >5.9)

## 2022-10-10 LAB — PREPARE RBC (CROSSMATCH)

## 2022-10-10 LAB — COMPREHENSIVE METABOLIC PANEL
ALT: 8 U/L (ref 0–44)
AST: 18 U/L (ref 15–41)
Albumin: 2.4 g/dL — ABNORMAL LOW (ref 3.5–5.0)
Alkaline Phosphatase: 94 U/L (ref 38–126)
Anion gap: 8 (ref 5–15)
BUN: 5 mg/dL — ABNORMAL LOW (ref 6–20)
CO2: 25 mmol/L (ref 22–32)
Calcium: 7.5 mg/dL — ABNORMAL LOW (ref 8.9–10.3)
Chloride: 98 mmol/L (ref 98–111)
Creatinine, Ser: 0.43 mg/dL — ABNORMAL LOW (ref 0.44–1.00)
GFR, Estimated: >60 mL/min (ref >60)
Glucose, Bld: 108 mg/dL — ABNORMAL HIGH (ref 70–99)
Potassium: 2.6 mmol/L — CL (ref 3.5–5.1)
Sodium: 131 mmol/L — ABNORMAL LOW (ref 135–145)
Total Bilirubin: 0.4 mg/dL (ref 0.3–1.2)
Total Protein: 7.7 g/dL (ref 6.5–8.1)

## 2022-10-10 LAB — FERRITIN: Ferritin: 8 ng/mL — ABNORMAL LOW (ref 11–307)

## 2022-10-10 LAB — IRON AND TIBC
Iron: 18 ug/dL — ABNORMAL LOW (ref 28–170)
Saturation Ratios: 4 % — ABNORMAL LOW (ref 10.4–31.8)
TIBC: 486 ug/dL — ABNORMAL HIGH (ref 250–450)
UIBC: 468 ug/dL

## 2022-10-10 LAB — VITAMIN B12: Vitamin B-12: 433 pg/mL (ref 180–914)

## 2022-10-10 LAB — RETICULOCYTES
Immature Retic Fract: 12.1 % (ref 2.3–15.9)
RBC.: 1.68 MIL/uL — ABNORMAL LOW (ref 3.87–5.11)
Retic Count, Absolute: 27 10*3/uL (ref 19.0–186.0)
Retic Ct Pct: 1.6 % (ref 0.4–3.1)

## 2022-10-10 LAB — PROTIME-INR
INR: 1.2 (ref 0.8–1.2)
Prothrombin Time: 15.3 seconds — ABNORMAL HIGH (ref 11.4–15.2)

## 2022-10-10 LAB — LACTIC ACID, PLASMA
Lactic Acid, Venous: 2.8 mmol/L (ref 0.5–1.9)
Lactic Acid, Venous: 1.6 mmol/L (ref 0.5–1.9)

## 2022-10-10 LAB — POC OCCULT BLOOD, ED: Fecal Occult Bld: NEGATIVE

## 2022-10-10 MED ORDER — SODIUM CHLORIDE 0.9% IV SOLUTION: Freq: Once | INTRAVENOUS | Status: DC

## 2022-10-10 MED ORDER — CHLORHEXIDINE GLUCONATE CLOTH 2 % EX PADS
6.0000 | MEDICATED_PAD | Freq: Every day | CUTANEOUS | Status: DC
  Administered 2022-10-10: 6 via TOPICAL

## 2022-10-10 MED ORDER — POTASSIUM CHLORIDE CRYS ER 20 MEQ PO TBCR
40.0000 meq | EXTENDED_RELEASE_TABLET | Freq: Once | ORAL | Status: AC
  Administered 2022-10-10: 40 meq via ORAL
  Filled 2022-10-10: qty 2

## 2022-10-10 MED ORDER — ALPRAZOLAM 0.5 MG PO TABS: 1.0000 mg | ORAL_TABLET | Freq: Three times a day (TID) | ORAL | Status: DC | PRN

## 2022-10-10 MED ORDER — ALBUTEROL SULFATE (2.5 MG/3ML) 0.083% IN NEBU: 2.5000 mg | INHALATION_SOLUTION | RESPIRATORY_TRACT | Status: DC | PRN

## 2022-10-10 MED ORDER — ONDANSETRON HCL 4 MG/2ML IJ SOLN: 4.0000 mg | Freq: Four times a day (QID) | INTRAMUSCULAR | Status: DC | PRN

## 2022-10-10 MED ORDER — ACETAMINOPHEN 325 MG PO TABS: 650.0000 mg | ORAL_TABLET | Freq: Four times a day (QID) | ORAL | Status: DC | PRN

## 2022-10-10 MED ORDER — ACETAMINOPHEN 650 MG RE SUPP: 650.0000 mg | Freq: Four times a day (QID) | RECTAL | Status: DC | PRN

## 2022-10-10 MED ORDER — METHADONE HCL 10 MG/ML PO CONC
83.0000 mg | Freq: Every day | ORAL | Status: DC
  Administered 2022-10-10: 83 mg via ORAL
  Filled 2022-10-10: qty 10

## 2022-10-10 MED ORDER — POLYETHYLENE GLYCOL 3350 17 G PO PACK: 17.0000 g | PACK | Freq: Every day | ORAL | Status: DC | PRN

## 2022-10-10 MED ORDER — ONDANSETRON HCL 4 MG PO TABS: 4.0000 mg | ORAL_TABLET | Freq: Four times a day (QID) | ORAL | Status: DC | PRN

## 2022-10-10 MED ORDER — DOCUSATE SODIUM 100 MG PO CAPS: 100.0000 mg | ORAL_CAPSULE | Freq: Two times a day (BID) | ORAL | Status: DC

## 2022-10-10 MED ORDER — ORAL CARE MOUTH RINSE: 15.0000 mL | OROMUCOSAL | Status: DC | PRN

## 2022-10-10 NOTE — H&P (Signed)
History and Physical  Mary Long [id number] DOB: 01-29-1976 DOA: 10/10/2022  PCP: Ethelene Hal, MD   Chief Complaint: weakness, anemia   HPI: Mary Long is a chronically ill 47 y.o. 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  She has a history of requiring blood transfusions, she has been scheduled electively for surgery with orthopedic surgery for revision of her right above-the-knee amputation stump and had labs recently in preparation for that.  This showed a hemoglobin of 2.3, she was told to come to the ER for evaluation and blood transfusion recheck is 2.1.  Discussed with the patient, she states that she feels lethargic, tired, but denies any fevers, chest pain, chills, nausea vomiting or any other new complaints.  She was given oral potassium for hypokalemia, and 3 units of blood have been ordered.  Review of Systems: Please see HPI for pertinent positives and negatives. A complete 10 system review of systems are otherwise negative.  Past Medical History:  Diagnosis Date   Bipolar 1 disorder ([place])    Blindness of left eye    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[place]) [id number]   Complication of anesthesia    causes manic attacks and anxiety post anesthesia   Depression    Hep C w/o coma, chronic ([place])    Illicit drug use    Osteomyelitis of right femur ([place]) 01/06/2017   Seizures ([place])    Past Surgical History:  Procedure Laterality Date   AMPUTATION Right 02/03/2014   Procedure: RIGHT AMPUTATION LEG THROUGH TIBIA/FIBULA;  Surgeon: Johnny Bridge, MD;  Location: [place];  Service: Orthopedics;  Laterality: Right;   AMPUTATION Right  01/12/2017   Procedure: RIGHT ABOVE KNEE AMPUTATION;  Surgeon: Newt Minion, MD;  Location: [place];  Service: Orthopedics;  Laterality: Right;   APPLICATION OF WOUND VAC Right 01/06/2017   Procedure: APPLICATION OF WOUND VAC;  Surgeon: Marchia Bond, MD;  Location: [place];  Service: Orthopedics;  Laterality: Right;   FRACTURE SURGERY     HARDWARE REMOVAL Right 01/06/2017   Procedure: HARDWARE REMOVAL RIGHT KNEE;  Surgeon: Marchia Bond, MD;  Location: [facility];  Service: Orthopedics;  Laterality: Right;   I&D of 4 abscesses to right arm and wrist  06/18/2003   IR FLUORO GUIDE CV LINE RIGHT  01/06/2017   IR US GUIDE VASC ACCESS RIGHT  01/06/2017   IRRIGATION AND DEBRIDEMENT KNEE Right 01/06/2017   Procedure: IRRIGATION AND DEBRIDEMENT RIGHT KNEE AND IRRIGATION AND DEBRIDEMENT BONE;  Surgeon: Marchia Bond, MD;  Location: [facility];  Service: Orthopedics;  Laterality: Right;   ORIF FEMUR FRACTURE Right 12/18/2012   Procedure: OPEN REDUCTION INTERNAL FIXATION (ORIF) DISTAL FEMUR FRACTURE;  Surgeon: Johnny Bridge, MD;  Location: [facility];  Service: Orthopedics;  Laterality: Right;   TUBAL LIGATION  01/01/2001    Social History:  reports that she has been smoking cigarettes. She has been smoking an average of 1 pack per day. She has never used smokeless tobacco. She reports current drug use. Drugs: Cocaine and Heroin. She reports that she does not drink alcohol.   No Known Allergies  No family history on file.   Prior to Admission medications   Medication Sig Start  Date End Date Taking? Authorizing Provider  acetaminophen (TYLENOL) 500 MG tablet Take 2,000-2,500 mg by mouth as needed for moderate pain.   Yes [provider]  ALPRAZolam Duanne Moron) 1 MG tablet Take 1 mg by mouth 3 (three) times daily as needed for anxiety or sleep.   Yes [provider]  ibuprofen (ADVIL) 200 MG tablet Take 400-600 mg by mouth as needed for moderate pain.   Yes [provider]  methadone (DOLOPHINE) 10  MG/ML solution Take 83 mg by mouth daily.   Yes [provider]  OVER THE COUNTER MEDICATION Take 1 tablet by mouth daily. Blood builder   Yes [provider]    Physical Exam: BP 119/60   Pulse 94   Temp 98.7 F (37.1 C) (Oral)   Resp 18   Ht 5\' 1"  (1.549 m)   Wt 52.2 kg   SpO2 100%   BMI 21.73 kg/m   General:  Alert, oriented, calm, in no acute distress, chronically ill in appearance, looks pale, thin emaciated Eyes: EOMI, clear conjuctivae, white sclerea Neck: supple, no masses, trachea mildline  Cardiovascular: RRR, no murmurs or rubs, no peripheral edema  Respiratory: clear to auscultation bilaterally, no wheezes, no crackles  Abdomen: soft, nontender, nondistended, normal bowel tones heard  Skin: she has bilateral upper extremity chronic wounds that do not appear infected Musculoskeletal: no joint effusions, normal range of motion  Psychiatric: appropriate affect, normal speech  Neurologic: extraocular muscles intact, clear speech, moving all extremities with intact sensorium          Labs on Admission:  Basic Metabolic Panel: Recent Labs  Lab 10/07/22 1319 10/10/22 0349  NA 135 131*  K 4.1 2.6*  CL 102 98  CO2 25 25  GLUCOSE 73 108*  BUN 7 5*  CREATININE 0.36* 0.43*  CALCIUM 7.8* 7.5*   Liver Function Tests: Recent Labs  Lab 10/07/22 1319 10/10/22 0349  AST 13 18  ALT 5* 8  ALKPHOS  --  94  BILITOT 0.3 0.4  PROT 7.7 7.7  ALBUMIN  --  2.4*   No results for input(s): "LIPASE", "AMYLASE" in the last 168 hours. No results for input(s): "AMMONIA" in the last 168 hours. CBC: Recent Labs  Lab 10/07/22 1319 10/10/22 0349  WBC 5.6 5.0  NEUTROABS 3,506 3.6  HGB 2.3* 2.1*  HCT 11.7* 9.5*  MCV 58.2* 56.5*  PLT 654* 449*   Cardiac Enzymes: No results for input(s): "CKTOTAL", "CKMB", "CKMBINDEX", "TROPONINI" in the last 168 hours.  BNP (last 3 results) No results for input(s): "BNP" in the last 8760 hours.  ProBNP (last 3  results) No results for input(s): "PROBNP" in the last 8760 hours.  CBG: No results for input(s): "GLUCAP" in the last 168 hours.  Radiological Exams on Admission: [place] EKG SITE RITE  Result Date: 10/10/2022 If Site Rite image not attached, placement could not be confirmed due to current cardiac rhythm.   Assessment/Plan Principal Problem:   Symptomatic anemia -she has a longstanding history of anemia, felt to most likely be due to chronic disease.  No evidence of GI bleeding, no other blood loss indicated. -Observation admission to progressive unit -Avoid blood thinners -Transfuse 3 units of blood -Orders placed for double-lumen PICC line as the patient has poor access -Home medications will be resumed once reconciled, awaiting confirmation for methadone dose -Lactic acidosis most likely due to severe anemia -Will give another oral dose of potassium -Recheck lactate, potassium, and hemoglobin at 3 PM -Her chronic  skin wounds appear to be stable  Active Problems:   Anemia   Wound dehiscence   Hypokalemia   Lactic acidosis   Chronic wound  DVT prophylaxis: Lovenox     Code Status: Full Code  Consults called: None  Admission status: Observation  Time spent: 36 minutes  Trei Schoch Neva Seat MD [facility] Pager [phone]  If 7PM-7AM, please contact night-coverage [email or web address] Password [facility]  10/10/2022, 7:48 AM

## 2022-10-10 NOTE — ED Notes (Signed)
ED TO INPATIENT HANDOFF REPORT  ED Nurse Name and Phone #: [phone]  S Name/Age/Gender Mary Long 47 y.o. female Room/Bed: WA15/WA15  Code Status   Code Status: Full Code  Home/SNF/Other Home Patient oriented to: self, place, time, and situation Is this baseline? Yes   Triage Complete: Triage complete  Chief Complaint Anemia [D64.9] Symptomatic anemia [D64.9]  Triage Note Patient coming to ED for evaluation of low hemoglobin of 2.3  Had labs drawn on 10/08/22 for pre-op.  Was called on 3/27 per chart and informed of labs and need to come to ED.  Pt reports hx of bleeding to "multiple wounds."  Patient pale and c/o dizziness.  Has had to have infusions in past.    Allergies No Known Allergies  Level of Care/Admitting Diagnosis ED Disposition     ED Disposition  Admit   Condition  --   Comment  Hospital Area: [facility] [[id number]]  Level of Care: Progressive [102]  Admit to Progressive based on following criteria: MULTISYSTEM THREATS such as stable sepsis, metabolic/electrolyte imbalance with or without encephalopathy that is responding to early treatment.  May place patient in observation at [facility] or [facility] if equivalent level of care is available:: No  Covid Evaluation: Asymptomatic - no recent exposure (last 10 days) testing not required  Diagnosis: Symptomatic anemia [id number]  Admitting Physician: Lucillie Garfinkel [id number]  Attending Physician: Hollice Gong, MIR M [[id number]]          B Medical/Surgery History Past Medical History:  Diagnosis Date   Bipolar 1 disorder ([place])    Blindness of left eye    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[place]) [id number]   Complication of anesthesia    causes manic attacks and anxiety post anesthesia   Depression    Hep C w/o coma, chronic ([place])    Illicit drug use    Osteomyelitis of right femur ([place]) 01/06/2017   Seizures ([place])    Past Surgical History:  Procedure Laterality Date    AMPUTATION Right 02/03/2014   Procedure: RIGHT AMPUTATION LEG THROUGH TIBIA/FIBULA;  Surgeon: Johnny Bridge, MD;  Location: [facility];  Service: Orthopedics;  Laterality: Right;   AMPUTATION Right 01/12/2017   Procedure: RIGHT ABOVE KNEE AMPUTATION;  Surgeon: Newt Minion, MD;  Location: [facility];  Service: Orthopedics;  Laterality: Right;   APPLICATION OF WOUND VAC Right 01/06/2017   Procedure: APPLICATION OF WOUND VAC;  Surgeon: Marchia Bond, MD;  Location: [facility];  Service: Orthopedics;  Laterality: Right;   FRACTURE SURGERY     HARDWARE REMOVAL Right 01/06/2017   Procedure: HARDWARE REMOVAL RIGHT KNEE;  Surgeon: Marchia Bond, MD;  Location: [facility];  Service: Orthopedics;  Laterality: Right;   I&D of 4 abscesses to right arm and wrist  06/18/2003   IR FLUORO GUIDE CV LINE RIGHT  01/06/2017   IR US GUIDE VASC ACCESS RIGHT  01/06/2017   IRRIGATION AND DEBRIDEMENT KNEE Right 01/06/2017   Procedure: IRRIGATION AND DEBRIDEMENT RIGHT KNEE AND IRRIGATION AND DEBRIDEMENT BONE;  Surgeon: Marchia Bond, MD;  Location: [place];  Service: Orthopedics;  Laterality: Right;   ORIF FEMUR FRACTURE Right 12/18/2012   Procedure: OPEN REDUCTION INTERNAL FIXATION (ORIF) DISTAL FEMUR FRACTURE;  Surgeon: Johnny Bridge, MD;  Location: [facility];  Service: Orthopedics;  Laterality: Right;   TUBAL LIGATION  01/01/2001     A IV Location/Drains/Wounds Patient Lines/Drains/Airways Status     Active Line/Drains/Airways     Name Placement date Placement time  Site Days   Peripheral IV --  --  --  --   Peripheral IV 10/10/22 20 G Anterior;Right;Upper Arm 10/10/22  0432  Arm  less than 1   PICC Double Lumen PICC Right --  --  -- --   Negative Pressure Wound Therapy Knee Right 01/06/17  --  --  2103   Negative Pressure Wound Therapy Leg Right 01/12/17  1700  --  2097   Wound 12/18/12 Ankle Right 12/18/12  0120  Ankle  3583            Intake/Output Last 24 hours No intake or output data in the 24 hours ending 10/10/22  [id number]  Labs/Imaging Results for orders placed or performed during the hospital encounter of 10/10/22 (from the past 48 hour(s))  CBC with Differential/Platelet     Status: Abnormal   Collection Time: 10/10/22  3:49 AM  Result Value Ref Range   WBC 5.0 4.0 - 10.5 K/uL   RBC 1.68 (L) 3.87 - 5.11 MIL/uL   Hemoglobin 2.1 (LL) 12.0 - 15.0 g/dL    Comment: Reticulocyte Hemoglobin testing may be clinically indicated, consider ordering this additional test [id number] THIS CRITICAL RESULT HAS VERIFIED AND BEEN CALLED TO I. CORTES,RN BY ATCHISON,MARY ON 03 29 2024 AT 0452, AND HAS BEEN READ BACK.     HCT 9.5 (L) 36.0 - 46.0 %   MCV 56.5 (L) 80.0 - 100.0 fL   MCH 12.5 (L) 26.0 - 34.0 pg   MCHC 22.1 (L) 30.0 - 36.0 g/dL   RDW 23.5 (H) 11.5 - 15.5 %   Platelets 449 (H) 150 - 400 K/uL   nRBC 1.0 (H) 0.0 - 0.2 %   Neutrophils Relative % 73 %   Neutro Abs 3.6 1.7 - 7.7 K/uL   Lymphocytes Relative 19 %   Lymphs Abs 0.9 0.7 - 4.0 K/uL   Monocytes Relative 7 %   Monocytes Absolute 0.4 0.1 - 1.0 K/uL   Eosinophils Relative 1 %   Eosinophils Absolute 0.0 0.0 - 0.5 K/uL   Basophils Relative 0 %   Basophils Absolute 0.0 0.0 - 0.1 K/uL   Immature Granulocytes 0 %   Abs Immature Granulocytes 0.02 0.00 - 0.07 K/uL    Comment: Performed at [facility], [place] [address], [place], [place] [address]  Comprehensive metabolic panel     Status: Abnormal   Collection Time: 10/10/22  3:49 AM  Result Value Ref Range   Sodium 131 (L) 135 - 145 mmol/L   Potassium 2.6 (LL) 3.5 - 5.1 mmol/L    Comment: CRITICAL RESULT CALLED TO, READ BACK BY AND VERIFIED WITH I. CORTES, RN   Chloride 98 98 - 111 mmol/L   CO2 25 22 - 32 mmol/L   Glucose, Bld 108 (H) 70 - 99 mg/dL    Comment: Glucose reference range applies only to samples taken after fasting for at least 8 hours.   BUN 5 (L) 6 - 20 mg/dL   Creatinine, Ser 0.43 (L) 0.44 - 1.00 mg/dL   Calcium 7.5 (L) 8.9 - 10.3 mg/dL   Total Protein 7.7 6.5 -  8.1 g/dL   Albumin 2.4 (L) 3.5 - 5.0 g/dL   AST 18 15 - 41 U/L   ALT 8 0 - 44 U/L   Alkaline Phosphatase 94 38 - 126 U/L   Total Bilirubin 0.4 0.3 - 1.2 mg/dL   GFR, Estimated >60 >60 mL/min    Comment: (NOTE) Calculated using the CKD-EPI Creatinine Equation (  2021)    Anion gap 8 5 - 15    Comment: Performed at [facility], [place] [address], [place], [place] [address]  Lactic acid, plasma     Status: Abnormal   Collection Time: 10/10/22  3:49 AM  Result Value Ref Range   Lactic Acid, Venous 2.8 (HH) 0.5 - 1.9 mmol/L    Comment: CRITICAL RESULT CALLED TO, READ BACK BY AND VERIFIED WITH I. CORTES, RN Performed at [facility], [place] [address], [place], [place] [address]   Protime-INR     Status: Abnormal   Collection Time: 10/10/22  3:49 AM  Result Value Ref Range   Prothrombin Time 15.3 (H) 11.4 - 15.2 seconds   INR 1.2 0.8 - 1.2    Comment: (NOTE) INR goal varies based on device and disease states. Performed at [facility], [place] [address] [place], [place] [address]   Type and screen     Status: None (Preliminary result)   Collection Time: 10/10/22  3:49 AM  Result Value Ref Range   ABO/RH(D) A POS    Antibody Screen NEG    Sample Expiration      10/13/2022,2359 Performed at [facility], [place] [address], [place], [place] [address]    Unit Number [id number]    Blood Component Type RED CELLS,LR    Unit division 00    Status of Unit ALLOCATED    Transfusion Status OK TO TRANSFUSE    Crossmatch Result Compatible    Unit Number [id number]    Blood Component Type RED CELLS,LR    Unit division 00    Status of Unit ALLOCATED    Transfusion Status OK TO TRANSFUSE    Crossmatch Result Compatible    Unit Number [id number]    Blood Component Type RED CELLS,LR    Unit division 00    Status of Unit ALLOCATED    Transfusion Status OK TO TRANSFUSE    Crossmatch Result Compatible   Vitamin B12      Status: None   Collection Time: 10/10/22  3:49 AM  Result Value Ref Range   Vitamin B-12 433 180 - 914 pg/mL    Comment: (NOTE) This assay is not validated for testing neonatal or myeloproliferative syndrome specimens for Vitamin B12 levels. Performed at [facility], [place] [address] [place], [place] [address]   Folate     Status: None   Collection Time: 10/10/22  3:49 AM  Result Value Ref Range   Folate 6.8 >5.9 ng/mL    Comment: Performed at [facility], [place] [address], [place], [place] [address]  Iron and TIBC     Status: Abnormal   Collection Time: 10/10/22  3:49 AM  Result Value Ref Range   Iron 18 (L) 28 - 170 ug/dL   TIBC 486 (H) 250 - 450 ug/dL   Saturation Ratios 4 (L) 10.4 - 31.8 %   UIBC 468 ug/dL    Comment: Performed at [facility], [place] [address] [place], [place] [address]  Ferritin     Status: Abnormal   Collection Time: 10/10/22  3:49 AM  Result Value Ref Range   Ferritin 8 (L) 11 - 307 ng/mL    Comment: Performed at [facility], [place] [address], [place], [place] [address]  Reticulocytes     Status: Abnormal   Collection Time: 10/10/22  3:49 AM  Result Value Ref Range   Retic Ct Pct 1.6  0.4 - 3.1 %   RBC. 1.68 (L) 3.87 - 5.11 MIL/uL   Retic Count, Absolute 27.0 19.0 - 186.0 K/uL   Immature Retic Fract 12.1 2.3 - 15.9 %    Comment: Performed at [facility], [place] [address] [place], [place] [address]  Prepare RBC (crossmatch)     Status: None   Collection Time: 10/10/22  4:54 AM  Result Value Ref Range   Order Confirmation      ORDER PROCESSED BY BLOOD BANK Performed at [facility], [place] [address], [place], [place] [address]   POC occult blood, ED     Status: None   Collection Time: 10/10/22  5:07 AM  Result Value Ref Range   Fecal Occult Bld NEGATIVE NEGATIVE   [place] EKG SITE RITE  Result Date: 10/10/2022 If Site Rite image not  attached, placement could not be confirmed due to current cardiac rhythm.   Pending Labs Unresulted Labs (From admission, onward)     Start     Ordered   10/11/22 [id number]  Basic metabolic panel  Tomorrow morning,   R        10/10/22 0742   10/11/22 0500  CBC  Tomorrow morning,   R        10/10/22 0742   10/10/22 1500  CBC  Once,   R        10/10/22 0742   10/10/22 [id number]  Basic metabolic panel  Once,   R        10/10/22 0748   10/10/22 1500  Lactic acid, plasma  Once,   R        10/10/22 0756   10/10/22 0741  HIV Antibody (routine testing w rflx)  (HIV Antibody (Routine testing w reflex) panel)  Once,   R        10/10/22 0742            Vitals/Pain Today's Vitals   10/10/22 0745 10/10/22 0754 10/10/22 0810 10/10/22 0812  BP:   129/64   Pulse: 90  96   Resp:   15   Temp:  98.1 F (36.7 C)  98.2 F (36.8 C)  TempSrc:  Oral  Oral  SpO2: 98%  100%   Weight:      Height:      PainSc:        Isolation Precautions No active isolations  Medications Medications  0.9 %  sodium chloride infusion (Manually program via Guardrails IV Fluids) (has no administration in time range)  ALPRAZolam (XANAX) tablet 1 mg (has no administration in time range)  acetaminophen (TYLENOL) tablet 650 mg (has no administration in time range)    Or  acetaminophen (TYLENOL) suppository 650 mg (has no administration in time range)  docusate sodium (COLACE) capsule 100 mg (has no administration in time range)  polyethylene glycol (MIRALAX / GLYCOLAX) packet 17 g (has no administration in time range)  ondansetron (ZOFRAN) tablet 4 mg (has no administration in time range)    Or  ondansetron (ZOFRAN) injection 4 mg (has no administration in time range)  albuterol (PROVENTIL) (2.5 MG/3ML) 0.083% nebulizer solution 2.5 mg (has no administration in time range)  potassium chloride SA (KLOR-CON M) CR tablet 40 mEq (has no administration in time range)  potassium chloride SA (KLOR-CON M) CR tablet 40 mEq (40  mEq Oral Given 10/10/22 0528)    Mobility manual wheelchair     Focused Assessments Massive wounds to both arms AKA right leg will need  PICC which MD is aware.    R Recommendations: See Admitting Provider Note  Report given to:   Additional Notes: [id number]

## 2022-10-10 NOTE — ED Provider Notes (Signed)
[place] EMERGENCY DEPARTMENT AT [facility] Provider Note   CSN: [id number] Arrival date & time: 10/10/22  0142     History  Chief Complaint  Patient presents with   Abnormal Lab    Mary Long is a 47 y.o. female.  Patient presents to the emergency department for evaluation of anemia.  Patient has a history of chronic recurrent anemia of unclear etiology, although it has been speculated that it is secondary to chronic disease and chronic wounds.  She has not noticed any bleeding, has been keeping an eye on her stools and has not had any blood in her stools or melena.  Patient had blood work performed prior to having a stump revision by orthopedics and was called and told that her hemoglobin was low, to come to the ED.       Home Medications Prior to Admission medications   Medication Sig Start Date End Date Taking? Authorizing Provider  ALPRAZolam Duanne Moron) 1 MG tablet Take 1 mg by mouth 3 (three) times daily as needed for anxiety or sleep.   Yes [provider]  methadone (DOLOPHINE) 10 MG/5ML solution Take 81 mg by mouth daily.   Yes [provider]  [facility] [employer]    [provider]      Allergies    Patient has no known allergies.    Review of Systems   Review of Systems  Physical Exam Updated Vital Signs BP (!) 115/50   Pulse 91   Temp 98.7 F (37.1 C) (Oral)   Resp 18   SpO2 100%  Physical Exam Vitals and nursing note reviewed. Exam conducted with a chaperone present.  Constitutional:      General: She is not in acute distress.    Appearance: She is well-developed.  HENT:     Head: Normocephalic and atraumatic.     Mouth/Throat:     Mouth: Mucous membranes are moist.  Eyes:     General: Vision grossly intact. Gaze aligned appropriately.     Extraocular Movements: Extraocular movements intact.     Conjunctiva/sclera: Conjunctivae normal.  Cardiovascular:     Rate and Rhythm: Regular  rhythm. Tachycardia present.     Pulses: Normal pulses.     Heart sounds: Normal heart sounds, S1 normal and S2 normal. No murmur heard.    No friction rub. No gallop.  Pulmonary:     Effort: Pulmonary effort is normal. No respiratory distress.     Breath sounds: Normal breath sounds.  Abdominal:     General: Bowel sounds are normal.     Palpations: Abdomen is soft.     Tenderness: There is no abdominal tenderness. There is no guarding or rebound.     Hernia: No hernia is present.  Genitourinary:    Rectum: Guaiac result negative.  Musculoskeletal:        General: No swelling.     Cervical back: Full passive range of motion without pain, normal range of motion and neck supple. No spinous process tenderness or muscular tenderness. Normal range of motion.     Right lower leg: No edema.     Left lower leg: No edema.  Skin:    General: Skin is warm and dry.     Capillary Refill: Capillary refill takes less than 2 seconds.     Findings: Wound (chronic, both arms) present. No ecchymosis, erythema or rash.  Neurological:     General: No focal deficit present.  Mental Status: She is alert and oriented to person, place, and time.     GCS: GCS eye subscore is 4. GCS verbal subscore is 5. GCS motor subscore is 6.     Cranial Nerves: Cranial nerves 2-12 are intact.     Sensory: Sensation is intact.     Motor: Motor function is intact.     Coordination: Coordination is intact.  Psychiatric:        Attention and Perception: Attention normal.        Mood and Affect: Mood normal.        Speech: Speech normal.        Behavior: Behavior normal.     ED Results / Procedures / Treatments   Labs (all labs ordered are listed, but only abnormal results are displayed) Labs Reviewed  CBC WITH DIFFERENTIAL/PLATELET - Abnormal; Notable for the following components:      Result Value   RBC 1.68 (*)    Hemoglobin 2.1 (*)    HCT 9.5 (*)    MCV 56.5 (*)    MCH 12.5 (*)    MCHC 22.1 (*)    RDW  23.5 (*)    Platelets 449 (*)    nRBC 1.0 (*)    All other components within normal limits  COMPREHENSIVE METABOLIC PANEL - Abnormal; Notable for the following components:   Sodium 131 (*)    Potassium 2.6 (*)    Glucose, Bld 108 (*)    BUN 5 (*)    Creatinine, Ser 0.43 (*)    Calcium 7.5 (*)    Albumin 2.4 (*)    All other components within normal limits  LACTIC ACID, PLASMA - Abnormal; Notable for the following components:   Lactic Acid, Venous 2.8 (*)    All other components within normal limits  PROTIME-INR - Abnormal; Notable for the following components:   Prothrombin Time 15.3 (*)    All other components within normal limits  IRON AND TIBC - Abnormal; Notable for the following components:   Iron 18 (*)    TIBC 486 (*)    Saturation Ratios 4 (*)    All other components within normal limits  FERRITIN - Abnormal; Notable for the following components:   Ferritin 8 (*)    All other components within normal limits  RETICULOCYTES - Abnormal; Notable for the following components:   RBC. 1.68 (*)    All other components within normal limits  VITAMIN B12  FOLATE  POC OCCULT BLOOD, ED  TYPE AND SCREEN  PREPARE RBC (CROSSMATCH)    EKG EKG Interpretation  Date/Time:  Friday October 10 2022 02:29:32 EDT Ventricular Rate:  96 PR Interval:  119 QRS Duration: 99 QT Interval:  392 QTC Calculation: 496 R Axis:   74 Text Interpretation: Sinus rhythm Borderline short PR interval LVH with secondary repolarization abnormality Borderline prolonged QT interval Confirmed by Orpah Greek [phone]) on 10/10/2022 2:55:17 AM  Radiology No results found.  Procedures Procedures    Angiocath insertion Performed by: Orpah Greek  Consent: Verbal consent obtained. Risks and benefits: risks, benefits and alternatives were discussed Time out: Immediately prior to procedure a "time out" was called to verify the correct patient, procedure, equipment, support staff and  site/side marked as required.  Preparation: Patient was prepped and draped in the usual sterile fashion.  Vein Location: R upper arm  Ultrasound Guided  Gauge: 20G  Normal blood return and flush without difficulty Patient tolerance: Patient tolerated the procedure well with  no immediate complications.    Medications Ordered in ED Medications  0.9 %  sodium chloride infusion (Manually program via Guardrails IV Fluids) (has no administration in time range)  potassium chloride SA (KLOR-CON M) CR tablet 40 mEq (40 mEq Oral Given 10/10/22 [id number])    ED Course/ Medical Decision Making/ A&P                             Medical Decision Making Amount and/or Complexity of Data Reviewed Labs: ordered.   Presented to the emergency [place] for evaluation of low blood counts.  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  No known history of GI bleeding.  She has not had rectal bleeding or melanotic stools.  Stool was brown, heme-negative.  Hemoglobin was confirmed at 2.1.  She has been this low in the past.  She will require hospitalization for multiple transfusions.   Potassium low at 2.6.  Given some oral potassium here in the ED.  She only has 1 IV because of significantly decreased ability to get IVs with her chronic wounds of her upper extremities.  At this point I will initiate the blood transfusion, can be paused for additional IV potassium if needed during hospitalization.        Final Clinical Impression(s) / ED Diagnoses Final diagnoses:  Symptomatic anemia  Hypokalemia    Rx / DC Orders ED Discharge Orders     None         Jatavia Keltner, Gwenyth Allegra, MD 10/10/22 [phone]

## 2022-10-10 NOTE — Progress Notes (Signed)
IV removed from previous RN before patient leaving. Patient has all belongings and leaving on her scooter at 28 with friend with her.

## 2022-10-10 NOTE — Progress Notes (Signed)
This RN witnessed pt sign AMA form. Pt A&Ox4. Raenette Rover NP aware.

## 2022-10-10 NOTE — ED Notes (Signed)
Called for [facility] and floor reports they dont know who is getting the patient at this time.

## 2022-10-10 NOTE — Progress Notes (Signed)
    Against Medical Advice   Mary Long is A/O x4 and has expresses desire to leave the Hospital immediately.   Patient has been warned that this is not medically advisable at this time, and can result in medical complications like Death and Disability. Patient understands and accepts the risks involved and assumes full responsibilty of this decision.  This patient has also been advised that if they feel the need for further medical assistance to return to any available ER or dial [phone].  Informed by Nursing staff that this patient has left care and has signed the form  Against Medical Advice on 10/10/2022 at 1916.    Mary Rover, DNP, [place]

## 2022-10-10 NOTE — Progress Notes (Signed)
At Bedside to evaluate upper arm for possible PICC placement and patieny stated that she did not want a PICC at this time that her IV was running fine and she rather not be stuck again. Stated that if something happened to this PIV she may consider PICC placement. Primary RN aware

## 2022-10-10 NOTE — ED Notes (Signed)
ED TO INPATIENT HANDOFF REPORT  ED Nurse Name and Phone #:  Lysle Rubens RN  S Name/Age/Gender Mary Long 47 y.o. female Room/Bed: WA15/WA15  Code Status   Code Status: Prior  Home/SNF/Other Home Patient oriented to: self, place, time, and situation Is this baseline? Yes   Triage Complete: Triage complete  Chief Complaint Anemia [D64.9]  Triage Note Patient coming to ED for evaluation of low hemoglobin of 2.3  Had labs drawn on 10/08/22 for pre-op.  Was called on 3/27 per chart and informed of labs and need to come to ED.  Pt reports hx of bleeding to "multiple wounds."  Patient pale and c/o dizziness.  Has had to have infusions in past.    Allergies No Known Allergies  Level of Care/Admitting Diagnosis ED Disposition     ED Disposition  Admit   Condition  --   Comment  Hospital Area: [place] [[id number]]  Level of Care: Telemetry [5]  Admit to tele based on following criteria: Monitor QTC interval  May place patient in observation at [facility] or [facility] if equivalent level of care is available:: Yes  Covid Evaluation: Asymptomatic - no recent exposure (last 10 days) testing not required  Diagnosis: Anemia [id number]  Admitting Physician: Jackelyn Knife [id number]  Attending Physician: Jackelyn Knife [id number]          B Medical/Surgery History Past Medical History:  Diagnosis Date   Bipolar 1 disorder ([place])    Blindness of left eye    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[place]) [id number]   Complication of anesthesia    causes manic attacks and anxiety post anesthesia   Depression    Hep C w/o coma, chronic ([place])    Illicit drug use    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   Seizures ([place])    Past Surgical History:  Procedure Laterality Date   AMPUTATION Right 02/03/2014   Procedure: RIGHT AMPUTATION LEG THROUGH TIBIA/FIBULA;  Surgeon: Johnny Bridge, MD;  Location: [place];  Service: Orthopedics;  Laterality:  Right;   AMPUTATION Right 01/12/2017   Procedure: RIGHT ABOVE KNEE AMPUTATION;  Surgeon: Newt Minion, MD;  Location: [place];  Service: Orthopedics;  Laterality: Right;   APPLICATION OF WOUND VAC Right 01/06/2017   Procedure: APPLICATION OF WOUND VAC;  Surgeon: Marchia Bond, MD;  Location: [place];  Service: Orthopedics;  Laterality: Right;   FRACTURE SURGERY     HARDWARE REMOVAL Right 01/06/2017   Procedure: HARDWARE REMOVAL RIGHT KNEE;  Surgeon: Marchia Bond, MD;  Location: [place];  Service: Orthopedics;  Laterality: Right;   I&D of 4 abscesses to right arm and wrist  06/18/2003   IR FLUORO GUIDE CV LINE RIGHT  01/06/2017   IR US GUIDE VASC ACCESS RIGHT  01/06/2017   IRRIGATION AND DEBRIDEMENT KNEE Right 01/06/2017   Procedure: IRRIGATION AND DEBRIDEMENT RIGHT KNEE AND IRRIGATION AND DEBRIDEMENT BONE;  Surgeon: Marchia Bond, MD;  Location: [place];  Service: Orthopedics;  Laterality: Right;   ORIF FEMUR FRACTURE Right 12/18/2012   Procedure: OPEN REDUCTION INTERNAL FIXATION (ORIF) DISTAL FEMUR FRACTURE;  Surgeon: Johnny Bridge, MD;  Location: [place];  Service: Orthopedics;  Laterality: Right;   TUBAL LIGATION  01/01/2001     A IV Location/Drains/Wounds Patient Lines/Drains/Airways Status     Active Line/Drains/Airways     Name Placement date Placement time Site Days   Peripheral IV --  --  --  --   Peripheral IV 10/10/22 20 G  Anterior;Right;Upper Arm 10/10/22  0432  Arm  less than 1   PICC Double Lumen PICC Right --  --  -- --   Negative Pressure Wound Therapy Knee Right 01/06/17  --  --  2103   Negative Pressure Wound Therapy Leg Right 01/12/17  1700  --  2097   Wound 12/18/12 Ankle Right 12/18/12  0120  Ankle  3583            Intake/Output Last 24 hours No intake or output data in the 24 hours ending 10/10/22 0609  Labs/Imaging Results for orders placed or performed during the hospital encounter of 10/10/22 (from the past 48 hour(s))  CBC with Differential/Platelet     Status:  Abnormal   Collection Time: 10/10/22  3:49 AM  Result Value Ref Range   WBC 5.0 4.0 - 10.5 K/uL   RBC 1.68 (L) 3.87 - 5.11 MIL/uL   Hemoglobin 2.1 (LL) 12.0 - 15.0 g/dL    Comment: Reticulocyte Hemoglobin testing may be clinically indicated, consider ordering this additional test [id number] THIS CRITICAL RESULT HAS VERIFIED AND BEEN CALLED TO I. CORTES,RN BY ATCHISON,MARY ON 03 29 2024 AT 0452, AND HAS BEEN READ BACK.     HCT 9.5 (L) 36.0 - 46.0 %   MCV 56.5 (L) 80.0 - 100.0 fL   MCH 12.5 (L) 26.0 - 34.0 pg   MCHC 22.1 (L) 30.0 - 36.0 g/dL   RDW 23.5 (H) 11.5 - 15.5 %   Platelets 449 (H) 150 - 400 K/uL   nRBC 1.0 (H) 0.0 - 0.2 %   Neutrophils Relative % 73 %   Neutro Abs 3.6 1.7 - 7.7 K/uL   Lymphocytes Relative 19 %   Lymphs Abs 0.9 0.7 - 4.0 K/uL   Monocytes Relative 7 %   Monocytes Absolute 0.4 0.1 - 1.0 K/uL   Eosinophils Relative 1 %   Eosinophils Absolute 0.0 0.0 - 0.5 K/uL   Basophils Relative 0 %   Basophils Absolute 0.0 0.0 - 0.1 K/uL   Immature Granulocytes 0 %   Abs Immature Granulocytes 0.02 0.00 - 0.07 K/uL    Comment: Performed at [facility], [place] [address] [place], [place] [address]  Comprehensive metabolic panel     Status: Abnormal   Collection Time: 10/10/22  3:49 AM  Result Value Ref Range   Sodium 131 (L) 135 - 145 mmol/L   Potassium 2.6 (LL) 3.5 - 5.1 mmol/L    Comment: CRITICAL RESULT CALLED TO, READ BACK BY AND VERIFIED WITH I. CORTES, RN   Chloride 98 98 - 111 mmol/L   CO2 25 22 - 32 mmol/L   Glucose, Bld 108 (H) 70 - 99 mg/dL    Comment: Glucose reference range applies only to samples taken after fasting for at least 8 hours.   BUN 5 (L) 6 - 20 mg/dL   Creatinine, Ser 0.43 (L) 0.44 - 1.00 mg/dL   Calcium 7.5 (L) 8.9 - 10.3 mg/dL   Total Protein 7.7 6.5 - 8.1 g/dL   Albumin 2.4 (L) 3.5 - 5.0 g/dL   AST 18 15 - 41 U/L   ALT 8 0 - 44 U/L   Alkaline Phosphatase 94 38 - 126 U/L   Total Bilirubin 0.4 0.3 - 1.2 mg/dL   GFR,  Estimated >60 >60 mL/min    Comment: (NOTE) Calculated using the CKD-EPI Creatinine Equation (2021)    Anion gap 8 5 - 15    Comment: Performed at [facility],  [place] [address] [place], [place] [address]  Lactic acid, plasma     Status: Abnormal   Collection Time: 10/10/22  3:49 AM  Result Value Ref Range   Lactic Acid, Venous 2.8 (HH) 0.5 - 1.9 mmol/L    Comment: CRITICAL RESULT CALLED TO, READ BACK BY AND VERIFIED WITH I. CORTES, RN Performed at [facility], [place] [address], [place], [place] [address]   Protime-INR     Status: Abnormal   Collection Time: 10/10/22  3:49 AM  Result Value Ref Range   Prothrombin Time 15.3 (H) 11.4 - 15.2 seconds   INR 1.2 0.8 - 1.2    Comment: (NOTE) INR goal varies based on device and disease states. Performed at [facility], [place] [address] [place], [place] [address]   Type and screen     Status: None (Preliminary result)   Collection Time: 10/10/22  3:49 AM  Result Value Ref Range   ABO/RH(D) A POS    Antibody Screen NEG    Sample Expiration      10/13/2022,2359 Performed at [facility], [place] [address] [place], [place] [address]    Unit Number [id number]    Blood Component Type RED CELLS,LR    Unit division 00    Status of Unit ALLOCATED    Transfusion Status OK TO TRANSFUSE    Crossmatch Result Compatible    Unit Number [id number]    Blood Component Type RED CELLS,LR    Unit division 00    Status of Unit ALLOCATED    Transfusion Status OK TO TRANSFUSE    Crossmatch Result Compatible    Unit Number [id number]    Blood Component Type RED CELLS,LR    Unit division 00    Status of Unit ALLOCATED    Transfusion Status OK TO TRANSFUSE    Crossmatch Result Compatible   Vitamin B12     Status: None   Collection Time: 10/10/22  3:49 AM  Result Value Ref Range   Vitamin B-12 433 180 - 914 pg/mL    Comment: (NOTE) This assay is not validated for  testing neonatal or myeloproliferative syndrome specimens for Vitamin B12 levels. Performed at [facility], [place] [address] [place], [place] [address]   Folate     Status: None   Collection Time: 10/10/22  3:49 AM  Result Value Ref Range   Folate 6.8 >5.9 ng/mL    Comment: Performed at [facility], [place] [address] [place], [place] [address]  Iron and TIBC     Status: Abnormal   Collection Time: 10/10/22  3:49 AM  Result Value Ref Range   Iron 18 (L) 28 - 170 ug/dL   TIBC 486 (H) 250 - 450 ug/dL   Saturation Ratios 4 (L) 10.4 - 31.8 %   UIBC 468 ug/dL    Comment: Performed at [facility], [place] [address] [place], [place] [address]  Ferritin     Status: Abnormal   Collection Time: 10/10/22  3:49 AM  Result Value Ref Range   Ferritin 8 (L) 11 - 307 ng/mL    Comment: Performed at [facility], [place] [address] [place], [place] [address]  Reticulocytes     Status: Abnormal   Collection Time: 10/10/22  3:49 AM  Result Value Ref Range   Retic Ct Pct 1.6 0.4 - 3.1 %   RBC. 1.68 (L) 3.87 - 5.11 MIL/uL   Retic Count, Absolute 27.0 19.0 -  186.0 K/uL   Immature Retic Fract 12.1 2.3 - 15.9 %    Comment: Performed at [facility], [place] [address] [place], [place] [address]  Prepare RBC (crossmatch)     Status: None   Collection Time: 10/10/22  4:54 AM  Result Value Ref Range   Order Confirmation      ORDER PROCESSED BY BLOOD BANK Performed at [facility], [place] [address], [place], [place] [address]   POC occult blood, ED     Status: None   Collection Time: 10/10/22  5:07 AM  Result Value Ref Range   Fecal Occult Bld NEGATIVE NEGATIVE   No results found.  Pending Labs Unresulted Labs (From admission, onward)    None       Vitals/Pain Today's Vitals   10/10/22 0210 10/10/22 0211 10/10/22 0315 10/10/22 0445  BP: (!) 115/53   (!) 115/50  Pulse: 97  90 91  Resp:  18  13 18   Temp: 98.7 F (37.1 C)     TempSrc: Oral     SpO2: (!) 89%  100% 100%  PainSc:  0-No pain      Isolation Precautions No active isolations  Medications Medications  0.9 %  sodium chloride infusion (Manually program via Guardrails IV Fluids) (has no administration in time range)  potassium chloride SA (KLOR-CON M) CR tablet 40 mEq (40 mEq Oral Given 10/10/22 0528)    Mobility walks     Focused Assessments Cardiac Assessment Handoff:    No results found for: "CKTOTAL", "CKMB", "CKMBINDEX", "TROPONINI" No results found for: "DDIMER" Does the Patient currently have chest pain? No   , Neuro Assessment Handoff:  Swallow screen pass?            Neuro Assessment:   Neuro Checks:      Has TPA been given? No If patient is a Neuro Trauma and patient is going to OR before floor call report to [place] nurse: [phone] or [phone]  , Renal Assessment Handoff:  Hemodialysis Schedule:  Last Hemodialysis date and time:   Restricted appendage:   , Pulmonary Assessment Handoff:  Lung sounds:   O2 Device: Room Air      R Recommendations: See Admitting Provider Note  Report given to:   Additional Notes:

## 2022-10-10 NOTE — ED Triage Notes (Addendum)
Patient coming to ED for evaluation of low hemoglobin of 2.3  Had labs drawn on 10/08/22 for pre-op.  Was called on 3/27 per chart and informed of labs and need to come to ED.  Pt reports hx of bleeding to "multiple wounds."  Patient pale and c/o dizziness.  Has had to have infusions in past.

## 2022-10-12 LAB — TYPE AND SCREEN
ABO/RH(D): A POS
Antibody Screen: NEGATIVE
Unit division: 0
Unit division: 0
Unit division: 0

## 2022-10-12 LAB — BPAM RBC
Blood Product Expiration Date: 202404222359
Blood Product Expiration Date: 202404222359
Blood Product Expiration Date: 202404222359
ISSUE DATE / TIME: 202403290938
ISSUE DATE / TIME: 202403291205
ISSUE DATE / TIME: 202403291346
Unit Type and Rh: 6200
Unit Type and Rh: 6200
Unit Type and Rh: 6200

## 2022-10-16 NOTE — Telephone Encounter (Signed)
I spoke with Dr. Sharol Given about this pt and he asked if you could reach out to the pt and see if she still wanted to proceed with revision surgery. She left the hospital AMA after blood transfusion.

## 2022-10-17 NOTE — Telephone Encounter (Signed)
I spoke with Mary Long.  She advised me that she did receive 3 units of blood before she left the hospital AMA.  She did not want to wait around and be admitted before surgery.  I have her back on the surgery schedule for Wednesday 10/22/22 and advised that the hospital will be in touch and that she will more than likely need to have her hemoglobin checked again prior to surgery.

## 2022-10-20 ENCOUNTER — Encounter (HOSPITAL_COMMUNITY): Payer: Self-pay | Admitting: Orthopedic Surgery

## 2022-10-20 ENCOUNTER — Other Ambulatory Visit: Payer: Self-pay

## 2022-10-20 NOTE — Pre-Procedure Instructions (Signed)
PCP - Dr.Asher Luiz Ochoa Cardiologist - pt denies  PPM/ICD - pt denies Device Orders - n/a Rep Notified - n/a  EKG - 10/10/22 Stress Test - pt denies ECHO - 01/09/17 Cardiac Cath - pt denies  Sleep Study/CPAP - pt denies  Diabetic- pt denies  Blood Thinner Instructions:pt denies Aspirin Instructions:n/a  NPO after Midnight   COVID TEST- n/a   Anesthesia review: YES, recent hospitalization. Low HBG/HCT.   Patient verbally denies any shortness of breath, fever, cough and chest pain during phone call.     -------------  SDW INSTRUCTIONS given:   Your procedure is scheduled on 10/22/22.             Report to [facility] Main Entrance "A" at  6:30  A.M., and check in at the Admitting office.             Call this number if you have problems the morning of surgery:             [phone]               Remember:             Do not eat or drink after midnight the night before your surgery                          Take these medicines the morning of surgery with A SIP OF WATER methadone,xanax prn,tylenol prn  As of today, STOP taking any Aspirin (unless otherwise instructed by your surgeon) Aleve, Naproxen, Ibuprofen, Motrin, Advil, Goody's, BC's, all herbal medications, fish oil, and all vitamins.                       Do not wear jewelry, make up, or nail polish            Do not wear lotions, powders, perfumes/colognes, or deodorant.            Do not shave 48 hours prior to surgery.  Men may shave face and neck.            Do not bring valuables to the hospital.            [facility] is not responsible for any belongings or valuables.   Do NOT Smoke (Tobacco/Vaping) 24 hours prior to your procedure If you use a CPAP at night, you may bring all equipment for your overnight stay.   Contacts, glasses, dentures or partials may not be worn into surgery.      For patients admitted to the hospital, discharge time will be determined by your treatment team.   Patients  discharged the day of surgery will not be allowed to drive home, and someone needs to stay with them for 24 hours.     Special instructions:   [place]- Preparing For Surgery  Oral Hygiene is also important to reduce your risk of infection.  Remember - BRUSH YOUR TEETH THE MORNING OF SURGERY WITH YOUR REGULAR TOOTHPASTE   Before surgery, you can play an important role. Because skin is not sterile, your skin needs to be as free of germs as possible. You can reduce the number of germs on your skin by washing with Dial (or any antibacterial) Soap before surgery.    Please do not use if you have an allergy to antibacterial soaps. If your skin becomes reddened/irritated stop using the Antibacterial Soap  Do not shave (including legs  and underarms) for at least 48 hours prior to surgery. It is OK to shave your face.   Please follow these instructions carefully.              Shower the NIGHT BEFORE SURGERY and the MORNING OF SURGERY with (DIAL) Antibacterial Soap. Wash your body and hair with your normal shampoo/soap then rinse. Using a clean wash cloth wash from your neck down using the antibacterial soap. Wash thoroughly, paying special attention to the area where your surgery will be performed. Thoroughly rinse your body with warm water from the neck down.   Pat yourself dry with a CLEAN TOWEL.   Wear CLEAN PAJAMAS to bed the night before surgery.   Place CLEAN SHEETS on your bed the night of your surgery and DO NOT SLEEP WITH PETS.     Day of Surgery: Please shower morning of surgery using antibacterial soap Wear Clean/Comfortable clothing the morning of surgery Do not apply any deodorants/lotions.   Remember to brush your teeth WITH YOUR REGULAR TOOTHPASTE.   Questions were answered. Patient verbalized understanding of instructions.

## 2022-10-21 ENCOUNTER — Encounter (HOSPITAL_COMMUNITY): Payer: Self-pay | Admitting: Orthopedic Surgery

## 2022-10-21 NOTE — Anesthesia Preprocedure Evaluation (Addendum)
Anesthesia Evaluation  Patient identified by MRN, date of birth, ID band Patient awake    Reviewed: Allergy & Precautions, NPO status , Patient's Chart, lab work & pertinent test results  History of Anesthesia Complications (+) DIFFICULT IV STICK / SPECIAL LINENegative for: history of anesthetic complications  Airway Mallampati: II  TM Distance: >3 FB Neck ROM: Full    Dental no notable dental hx.    Pulmonary Current Smoker   Pulmonary exam normal        Cardiovascular negative cardio ROS Normal cardiovascular exam  Echo 06/23/22 (care everywhere): EF 35-40%, mild MR, moderate LAE, low normal RV function, mild to moderate RAE     Neuro/Psych Seizures -, Well Controlled,    Depression Bipolar Disorder      GI/Hepatic negative GI ROS,,,(+)     substance abuse (Hx of opioid abuse, on methadone)  , Hepatitis -, C  Endo/Other  negative endocrine ROS    Renal/GU negative Renal ROS  negative genitourinary   Musculoskeletal  (+) Arthritis ,    Abdominal   Peds  Hematology  (+) Blood dyscrasia, anemia   Anesthesia Other Findings Dehiscence Right Above Knee Amputation    Reproductive/Obstetrics negative OB ROS                              Anesthesia Physical Anesthesia Plan  ASA: 4  Anesthesia Plan: General   Post-op Pain Management: Tylenol PO (pre-op)*, Ketamine IV* and Dilaudid IV   Induction: Intravenous  PONV Risk Score and Plan: 3 and Midazolam, Ondansetron, Treatment may vary due to age or medical condition and Propofol infusion  Airway Management Planned: LMA  Additional Equipment: None  Intra-op Plan:   Post-operative Plan: Extubation in OR  Informed Consent: I have reviewed the patients History and Physical, chart, labs and discussed the procedure including the risks, benefits and alternatives for the proposed anesthesia with the patient or authorized representative who  has indicated his/her understanding and acceptance.     Dental advisory given  Plan Discussed with: CRNA  Anesthesia Plan Comments: (Pre-op labs obtained by Dr. Audrie Lia office 09/2722 showed hgb 2.3; pt was advised to go to ED and did so on 10/10/22. She was admitted to the hospital with recurrent symptomatic anemia. ER hgb recheck was 2.1. Pt received 3 units PRBCs and left the hospital AMA.   **Will need pre-surgical CBC, BMP, T&S.** **No intraoperative dexamethasone due to hx of postoperative severe anxiety/mania**)        Anesthesia Quick Evaluation

## 2022-10-21 NOTE — Progress Notes (Signed)
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 I added heart failure to hx based on echo from December 2023 at [facility].   Pre-op labs obtained by Dr. Audrie Lia office 09/2722 showed hgb 2.3; pt was advised to go to ED and did so on 3./29/24. She was admitted to the hospital with recurrent symptomatic anemia. ER hgb recheck was 2.1. Pt received 3 units PRBCs and left the hospital AMA.   Hospitalized 12/10-12/11/23 at [facility] for upper extremity ulcer wound exposing fat layer, grew out gram positive cocci from 1 of 2 blood cultures. Echo at that time to check for endocarditis showed normal valve leaflets but EF was 35-40%  Reviewed case with Dr. Okey Dupre.    VS: LMP  (LMP Unknown)   PROVIDERS: - PCP is listed as Rozann Lesches, MD - he is a ID physician at [facility]   LABS: Will be obtained day of surgery  - CBC 10/10/22 after 3 units of PRBCs was 6.3, improved from 2.1 - BMP 10/10/22 showed Na 133 and Ca 7.5  IMAGES: 1 view CXR 04/25/22:  1. Markedly enlarged cardiac silhouette, increased in size compared to the prior study. A pericardial effusion cannot be excluded.  2. Multiple chronic bilateral rib fractures.  EKG 10/10/22: Sinus rhythm Borderline short PR interval LVH with secondary repolarization abnormality Borderline prolonged QT interval  CV: Echo 06/23/22 (care everywhere):   1. The left  ventricle is normal in size with normal wall thickness.    2. The left ventricular systolic function is moderately decreased, LVEF is visually estimated at 35-40%.    3. Prominent trabeculations noted in the left ventricle.    4. Left ventricular wall motion abnormalities as noted below.    5. There is mild mitral valve regurgitation.    6. The left atrium is moderately dilated in size.    7. The right ventricle is normal in size, with low normal systolic function.    8. The right atrium is mildly to moderately dilated in size.    Past Medical History:  Diagnosis Date   Anemia    Bipolar 1 disorder    Blindness of left eye    Chronic osteomyelitis of right tibia 02/03/2014   Complication of anesthesia    causes manic attacks and anxiety post anesthesia   Depression    Heart failure    EF 35-40% on echo 06/2022 at [facility]   Hep C w/o coma, chronic    Illicit drug use    Osteomyelitis of right femur 01/06/2017   Seizures    15 years ago per pt    Past Surgical History:  Procedure Laterality Date   AMPUTATION Right 02/03/2014   Procedure: RIGHT AMPUTATION LEG THROUGH TIBIA/FIBULA;  Surgeon: Eulas Post, MD;  Location: [facility] OR;  Service: Orthopedics;  Laterality: Right;   AMPUTATION Right 01/12/2017   Procedure: RIGHT ABOVE  KNEE AMPUTATION;  Surgeon: Nadara Mustard, MD;  Location: [facility] OR;  Service: Orthopedics;  Laterality: Right;   APPLICATION OF WOUND VAC Right 01/06/2017   Procedure: APPLICATION OF WOUND VAC;  Surgeon: Teryl Lucy, MD;  Location[facility];  Service: Orthopedics;  Laterality: Right;   FRACTURE SURGERY     HARDWARE REMOVAL Right 01/06/2017   Procedure: HARDWARE REMOVAL RIGHT KNEE;  Surgeon: Teryl Lucy, MD;  Location: [facility] OR;  Service: Orthopedics;  Laterality: Right;   I&D of 4 abscesses to right arm and wrist  06/18/2003   IR FLUORO GUIDE CV LINE RIGHT  01/06/2017   IR US GUIDE VASC ACCESS RIGHT  01/06/2017   IRRIGATION AND DEBRIDEMENT KNEE Right 01/06/2017    Procedure: IRRIGATION AND DEBRIDEMENT RIGHT KNEE AND IRRIGATION AND DEBRIDEMENT BONE;  Surgeon: Teryl Lucy, MD;  Location[facility] OR;  Service: Orthopedics;  Laterality: Right;   ORIF FEMUR FRACTURE Right 12/18/2012   Procedure: OPEN REDUCTION INTERNAL FIXATION (ORIF) DISTAL FEMUR FRACTURE;  Surgeon: Eulas Post, MD;  Location[facility] OR;  Service: Orthopedics;  Laterality: Right;   TUBAL LIGATION  01/01/2001    MEDICATIONS: No current facility-administered medications for this encounter.    acetaminophen (TYLENOL) 500 MG tablet   ALPRAZolam (XANAX) 1 MG tablet   ibuprofen (ADVIL) 200 MG tablet   methadone (DOLOPHINE) 10 MG/ML solution   OVER THE COUNTER MEDICATION    Pt will need further evaluation by assigned anesthesiologist day of surgery.   Rica Mast, PhD, FNP-BC [facility]/Anesthesiology Phone: [phone] 10/21/2022 11:11 AM

## 2022-10-22 ENCOUNTER — Inpatient Hospital Stay (HOSPITAL_COMMUNITY): Payer: Medicare Other | Admitting: Emergency Medicine

## 2022-10-22 ENCOUNTER — Encounter (HOSPITAL_COMMUNITY)
Admission: RE | Disposition: A | Discharge status: Home / Self Care | Payer: Self-pay | Source: Home / Self Care | Attending: Orthopedic Surgery

## 2022-10-22 ENCOUNTER — Encounter (HOSPITAL_COMMUNITY): Payer: Self-pay | Admitting: Orthopedic Surgery

## 2022-10-22 ENCOUNTER — Other Ambulatory Visit: Payer: Self-pay

## 2022-10-22 ENCOUNTER — Inpatient Hospital Stay (HOSPITAL_COMMUNITY)
Admission: RE | Admit: 2022-10-22 | Discharge: 2022-10-23 | DRG: 464 | Disposition: A | Discharge status: Home / Self Care | Payer: Medicare Other | Attending: Orthopedic Surgery | Admitting: Orthopedic Surgery

## 2022-10-22 ENCOUNTER — Inpatient Hospital Stay (HOSPITAL_COMMUNITY): Payer: Medicare Other

## 2022-10-22 DIAGNOSIS — H5462 Unqualified visual loss, left eye, normal vision right eye: Secondary | ICD-10-CM | POA: Diagnosis present

## 2022-10-22 DIAGNOSIS — Y835 Amputation of limb(s) as the cause of abnormal reaction of the patient, or of later complication, without mention of misadventure at the time of the procedure: Secondary | ICD-10-CM | POA: Diagnosis present

## 2022-10-22 DIAGNOSIS — T8781 Dehiscence of amputation stump: Secondary | ICD-10-CM | POA: Diagnosis present

## 2022-10-22 DIAGNOSIS — F17210 Nicotine dependence, cigarettes, uncomplicated: Secondary | ICD-10-CM

## 2022-10-22 DIAGNOSIS — M86251 Subacute osteomyelitis, right femur: Secondary | ICD-10-CM

## 2022-10-22 DIAGNOSIS — D649 Anemia, unspecified: Secondary | ICD-10-CM

## 2022-10-22 DIAGNOSIS — Z89611 Acquired absence of right leg above knee: Principal | ICD-10-CM

## 2022-10-22 DIAGNOSIS — F1120 Opioid dependence, uncomplicated: Secondary | ICD-10-CM | POA: Diagnosis present

## 2022-10-22 DIAGNOSIS — Z79899 Other long term (current) drug therapy: Secondary | ICD-10-CM | POA: Diagnosis not present

## 2022-10-22 DIAGNOSIS — F319 Bipolar disorder, unspecified: Secondary | ICD-10-CM | POA: Diagnosis present

## 2022-10-22 HISTORY — DX: Anemia, unspecified: D64.9

## 2022-10-22 HISTORY — PX: STUMP REVISION: SHX6102

## 2022-10-22 HISTORY — DX: Heart failure, unspecified: I50.9

## 2022-10-22 LAB — CBC
HCT: 21.1 % — ABNORMAL LOW (ref 36.0–46.0)
Hemoglobin: 5.8 g/dL — CL (ref 12.0–15.0)
MCH: 19.5 pg — ABNORMAL LOW (ref 26.0–34.0)
MCHC: 27.5 g/dL — ABNORMAL LOW (ref 30.0–36.0)
MCV: 71 fL — ABNORMAL LOW (ref 80.0–100.0)
Platelets: 552 10*3/uL — ABNORMAL HIGH (ref 150–400)
RBC: 2.97 MIL/uL — ABNORMAL LOW (ref 3.87–5.11)
RDW: 33.1 % — ABNORMAL HIGH (ref 11.5–15.5)
WBC: 5.6 10*3/uL (ref 4.0–10.5)
nRBC: 0 % (ref 0.0–0.2)

## 2022-10-22 LAB — COMPREHENSIVE METABOLIC PANEL
ALT: 10 U/L (ref 0–44)
AST: 18 U/L (ref 15–41)
Albumin: 1.8 g/dL — ABNORMAL LOW (ref 3.5–5.0)
Alkaline Phosphatase: 83 U/L (ref 38–126)
Anion gap: 7 (ref 5–15)
BUN: 6 mg/dL (ref 6–20)
CO2: 27 mmol/L (ref 22–32)
Calcium: 8 mg/dL — ABNORMAL LOW (ref 8.9–10.3)
Chloride: 100 mmol/L (ref 98–111)
Creatinine, Ser: 0.37 mg/dL — ABNORMAL LOW (ref 0.44–1.00)
GFR, Estimated: >60 mL/min (ref >60)
Glucose, Bld: 83 mg/dL (ref 70–99)
Potassium: 3.6 mmol/L (ref 3.5–5.1)
Sodium: 134 mmol/L — ABNORMAL LOW (ref 135–145)
Total Bilirubin: 0.4 mg/dL (ref 0.3–1.2)
Total Protein: 6.9 g/dL (ref 6.5–8.1)

## 2022-10-22 LAB — TYPE AND SCREEN: Unit division: 0

## 2022-10-22 LAB — PREPARE RBC (CROSSMATCH)

## 2022-10-22 LAB — HEMOGLOBIN AND HEMATOCRIT, BLOOD
HCT: 27.3 % — ABNORMAL LOW (ref 36.0–46.0)
Hemoglobin: 7.9 g/dL — ABNORMAL LOW (ref 12.0–15.0)

## 2022-10-22 LAB — BPAM RBC: Blood Product Expiration Date: 202405032359

## 2022-10-22 LAB — POCT PREGNANCY, URINE: Preg Test, Ur: NEGATIVE

## 2022-10-22 SURGERY — REVISION, AMPUTATION SITE
Anesthesia: General | Site: Leg Upper | Laterality: Right

## 2022-10-22 MED ORDER — SODIUM CHLORIDE 0.9 % IV SOLN: INTRAVENOUS | Status: DC

## 2022-10-22 MED ORDER — DROPERIDOL 2.5 MG/ML IJ SOLN: 0.6250 mg | Freq: Once | INTRAMUSCULAR | Status: DC | PRN

## 2022-10-22 MED ORDER — LIDOCAINE 2% (20 MG/ML) 5 ML SYRINGE
INTRAMUSCULAR | Status: AC
  Filled 2022-10-22: qty 5

## 2022-10-22 MED ORDER — LABETALOL HCL 5 MG/ML IV SOLN: 10.0000 mg | INTRAVENOUS | Status: DC | PRN

## 2022-10-22 MED ORDER — DOCUSATE SODIUM 100 MG PO CAPS
100.0000 mg | ORAL_CAPSULE | Freq: Every day | ORAL | Status: DC
  Administered 2022-10-23: 100 mg via ORAL
  Filled 2022-10-22: qty 1

## 2022-10-22 MED ORDER — GUAIFENESIN-DM 100-10 MG/5ML PO SYRP: 15.0000 mL | ORAL_SOLUTION | ORAL | Status: DC | PRN

## 2022-10-22 MED ORDER — CEFAZOLIN SODIUM-DEXTROSE 2-4 GM/100ML-% IV SOLN
2.0000 g | INTRAVENOUS | Status: AC
  Administered 2022-10-22: 2 g via INTRAVENOUS
  Filled 2022-10-22: qty 100

## 2022-10-22 MED ORDER — ONDANSETRON HCL 4 MG/2ML IJ SOLN
INTRAMUSCULAR | Status: AC
  Filled 2022-10-22: qty 2

## 2022-10-22 MED ORDER — DEXAMETHASONE SODIUM PHOSPHATE 10 MG/ML IJ SOLN
INTRAMUSCULAR | Status: AC
  Filled 2022-10-22: qty 1

## 2022-10-22 MED ORDER — FENTANYL CITRATE (PF) 250 MCG/5ML IJ SOLN
INTRAMUSCULAR | Status: DC | PRN
  Administered 2022-10-22 (×2): 50 ug via INTRAVENOUS

## 2022-10-22 MED ORDER — OXYCODONE HCL 5 MG PO TABS
5.0000 mg | ORAL_TABLET | ORAL | Status: DC | PRN
  Administered 2022-10-23 (×2): 10 mg via ORAL
  Filled 2022-10-22 (×3): qty 2

## 2022-10-22 MED ORDER — TRANEXAMIC ACID-NACL 1000-0.7 MG/100ML-% IV SOLN
1000.0000 mg | INTRAVENOUS | Status: AC
  Administered 2022-10-22: 1000 mg via INTRAVENOUS
  Filled 2022-10-22: qty 100

## 2022-10-22 MED ORDER — MIDAZOLAM HCL 2 MG/2ML IJ SOLN
INTRAMUSCULAR | Status: AC
  Filled 2022-10-22: qty 2

## 2022-10-22 MED ORDER — DEXAMETHASONE SODIUM PHOSPHATE 10 MG/ML IJ SOLN
INTRAMUSCULAR | Status: DC | PRN
  Administered 2022-10-22: 4 mg via INTRAVENOUS

## 2022-10-22 MED ORDER — ALUM & MAG HYDROXIDE-SIMETH 200-200-20 MG/5ML PO SUSP: 15.0000 mL | ORAL | Status: DC | PRN

## 2022-10-22 MED ORDER — KETAMINE HCL 10 MG/ML IJ SOLN
INTRAMUSCULAR | Status: DC | PRN
  Administered 2022-10-22: 20 mg via INTRAVENOUS

## 2022-10-22 MED ORDER — CEFAZOLIN SODIUM-DEXTROSE 2-4 GM/100ML-% IV SOLN
2.0000 g | Freq: Three times a day (TID) | INTRAVENOUS | Status: AC
  Administered 2022-10-22 – 2022-10-23 (×2): 2 g via INTRAVENOUS
  Filled 2022-10-22 (×2): qty 100

## 2022-10-22 MED ORDER — METHADONE HCL 10 MG PO TABS
80.0000 mg | ORAL_TABLET | Freq: Every day | ORAL | Status: DC
  Administered 2022-10-22 – 2022-10-23 (×2): 80 mg via ORAL
  Filled 2022-10-22 (×2): qty 8

## 2022-10-22 MED ORDER — LACTATED RINGERS IV SOLN: INTRAVENOUS | Status: DC

## 2022-10-22 MED ORDER — PHENYLEPHRINE 80 MCG/ML (10ML) SYRINGE FOR IV PUSH (FOR BLOOD PRESSURE SUPPORT)
PREFILLED_SYRINGE | INTRAVENOUS | Status: AC
  Filled 2022-10-22: qty 10

## 2022-10-22 MED ORDER — ONDANSETRON HCL 4 MG/2ML IJ SOLN: 4.0000 mg | Freq: Four times a day (QID) | INTRAMUSCULAR | Status: DC | PRN

## 2022-10-22 MED ORDER — MAGNESIUM SULFATE 2 GM/50ML IV SOLN: 2.0000 g | Freq: Every day | INTRAVENOUS | Status: DC | PRN

## 2022-10-22 MED ORDER — HYDROMORPHONE HCL 1 MG/ML IJ SOLN: 0.5000 mg | INTRAMUSCULAR | Status: DC | PRN

## 2022-10-22 MED ORDER — ORAL CARE MOUTH RINSE: 15.0000 mL | Freq: Once | OROMUCOSAL | Status: AC

## 2022-10-22 MED ORDER — FENTANYL CITRATE (PF) 250 MCG/5ML IJ SOLN
INTRAMUSCULAR | Status: AC
  Filled 2022-10-22: qty 5

## 2022-10-22 MED ORDER — POLYETHYLENE GLYCOL 3350 17 G PO PACK: 17.0000 g | PACK | Freq: Every day | ORAL | Status: DC | PRN

## 2022-10-22 MED ORDER — POTASSIUM CHLORIDE CRYS ER 20 MEQ PO TBCR: 20.0000 meq | EXTENDED_RELEASE_TABLET | Freq: Every day | ORAL | Status: DC | PRN

## 2022-10-22 MED ORDER — VITAMIN C 500 MG PO TABS
1000.0000 mg | ORAL_TABLET | Freq: Every day | ORAL | Status: DC
  Administered 2022-10-22 – 2022-10-23 (×2): 1000 mg via ORAL
  Filled 2022-10-22 (×2): qty 2

## 2022-10-22 MED ORDER — HYDROMORPHONE HCL 1 MG/ML IJ SOLN
0.2500 mg | INTRAMUSCULAR | Status: DC | PRN
  Administered 2022-10-22 (×4): 0.5 mg via INTRAVENOUS

## 2022-10-22 MED ORDER — METOPROLOL TARTRATE 5 MG/5ML IV SOLN: 2.0000 mg | INTRAVENOUS | Status: DC | PRN

## 2022-10-22 MED ORDER — LIDOCAINE 2% (20 MG/ML) 5 ML SYRINGE
INTRAMUSCULAR | Status: DC | PRN
  Administered 2022-10-22: 40 mg via INTRAVENOUS

## 2022-10-22 MED ORDER — ALPRAZOLAM 0.5 MG PO TABS
1.0000 mg | ORAL_TABLET | Freq: Three times a day (TID) | ORAL | Status: DC | PRN
  Administered 2022-10-22: 1 mg via ORAL
  Filled 2022-10-22: qty 2

## 2022-10-22 MED ORDER — PHENOL 1.4 % MT LIQD: 1.0000 | OROMUCOSAL | Status: DC | PRN

## 2022-10-22 MED ORDER — ONDANSETRON HCL 4 MG/2ML IJ SOLN
INTRAMUSCULAR | Status: DC | PRN
  Administered 2022-10-22: 4 mg via INTRAVENOUS

## 2022-10-22 MED ORDER — TRANEXAMIC ACID 1000 MG/10ML IV SOLN
2000.0000 mg | INTRAVENOUS | Status: DC
  Filled 2022-10-22: qty 20

## 2022-10-22 MED ORDER — ACETAMINOPHEN 325 MG PO TABS
325.0000 mg | ORAL_TABLET | Freq: Four times a day (QID) | ORAL | Status: DC | PRN
  Administered 2022-10-23 (×2): 650 mg via ORAL
  Filled 2022-10-22 (×2): qty 2

## 2022-10-22 MED ORDER — JUVEN PO PACK
1.0000 | PACK | Freq: Two times a day (BID) | ORAL | Status: DC
  Administered 2022-10-22 – 2022-10-23 (×2): 1 via ORAL
  Filled 2022-10-22 (×2): qty 1

## 2022-10-22 MED ORDER — PHENYLEPHRINE HCL-NACL 20-0.9 MG/250ML-% IV SOLN
INTRAVENOUS | Status: DC | PRN
  Administered 2022-10-22: 50 ug/min via INTRAVENOUS

## 2022-10-22 MED ORDER — BISACODYL 5 MG PO TBEC: 5.0000 mg | DELAYED_RELEASE_TABLET | Freq: Every day | ORAL | Status: DC | PRN

## 2022-10-22 MED ORDER — KETAMINE HCL 50 MG/5ML IJ SOSY
PREFILLED_SYRINGE | INTRAMUSCULAR | Status: AC
  Filled 2022-10-22: qty 5

## 2022-10-22 MED ORDER — MIDAZOLAM HCL 5 MG/5ML IJ SOLN
INTRAMUSCULAR | Status: DC | PRN
  Administered 2022-10-22: 2 mg via INTRAVENOUS

## 2022-10-22 MED ORDER — PROPOFOL 10 MG/ML IV BOLUS
INTRAVENOUS | Status: AC
  Filled 2022-10-22: qty 20

## 2022-10-22 MED ORDER — 0.9 % SODIUM CHLORIDE (POUR BTL) OPTIME
TOPICAL | Status: DC | PRN
  Administered 2022-10-22: 1000 mL

## 2022-10-22 MED ORDER — OXYCODONE HCL 5 MG PO TABS
10.0000 mg | ORAL_TABLET | ORAL | Status: DC | PRN
  Administered 2022-10-22: 15 mg via ORAL
  Filled 2022-10-22: qty 3

## 2022-10-22 MED ORDER — HYDROMORPHONE HCL 1 MG/ML IJ SOLN
INTRAMUSCULAR | Status: AC
  Filled 2022-10-22: qty 2

## 2022-10-22 MED ORDER — HYDRALAZINE HCL 20 MG/ML IJ SOLN: 5.0000 mg | INTRAMUSCULAR | Status: DC | PRN

## 2022-10-22 MED ORDER — MAGNESIUM CITRATE PO SOLN: 1.0000 | Freq: Once | ORAL | Status: DC | PRN

## 2022-10-22 MED ORDER — CHLORHEXIDINE GLUCONATE 0.12 % MT SOLN
15.0000 mL | Freq: Once | OROMUCOSAL | Status: AC
  Administered 2022-10-22: 15 mL via OROMUCOSAL
  Filled 2022-10-22: qty 15

## 2022-10-22 MED ORDER — ACETAMINOPHEN 500 MG PO TABS
1000.0000 mg | ORAL_TABLET | Freq: Once | ORAL | Status: DC
  Filled 2022-10-22: qty 2

## 2022-10-22 MED ORDER — PANTOPRAZOLE SODIUM 40 MG PO TBEC
40.0000 mg | DELAYED_RELEASE_TABLET | Freq: Every day | ORAL | Status: DC
  Administered 2022-10-22 – 2022-10-23 (×2): 40 mg via ORAL
  Filled 2022-10-22 (×2): qty 1

## 2022-10-22 MED ORDER — PROPOFOL 10 MG/ML IV BOLUS
INTRAVENOUS | Status: DC | PRN
  Administered 2022-10-22: 90 mg via INTRAVENOUS
  Administered 2022-10-22: 30 mg via INTRAVENOUS

## 2022-10-22 MED ORDER — ZINC SULFATE 220 (50 ZN) MG PO CAPS
220.0000 mg | ORAL_CAPSULE | Freq: Every day | ORAL | Status: DC
  Administered 2022-10-22 – 2022-10-23 (×2): 220 mg via ORAL
  Filled 2022-10-22 (×2): qty 1

## 2022-10-22 SURGICAL SUPPLY — 34 items
BAG COUNTER SPONGE SURGICOUNT (BAG) ×1 IMPLANT
BAG SPNG CNTER NS LX DISP (BAG) ×1
BLADE SAW RECIP 87.9 MT (BLADE) IMPLANT
BLADE SURG 21 STRL SS (BLADE) ×1 IMPLANT
CANISTER WOUND CARE 500ML ATS (WOUND CARE) ×1 IMPLANT
COVER SURGICAL LIGHT HANDLE (MISCELLANEOUS) ×1 IMPLANT
DRAPE EXTREMITY T 121X128X90 (DISPOSABLE) ×1 IMPLANT
DRAPE HALF SHEET 40X57 (DRAPES) ×1 IMPLANT
DRAPE INCISE IOBAN 66X45 STRL (DRAPES) ×1 IMPLANT
DRAPE U-SHAPE 47X51 STRL (DRAPES) ×2 IMPLANT
DRESSING PREVENA PLUS CUSTOM (GAUZE/BANDAGES/DRESSINGS) ×1 IMPLANT
DRSG PREVENA PLUS CUSTOM (GAUZE/BANDAGES/DRESSINGS) ×1
DURAPREP 26ML APPLICATOR (WOUND CARE) ×1 IMPLANT
ELECT REM PT RETURN 9FT ADLT (ELECTROSURGICAL) ×1
ELECTRODE REM PT RTRN 9FT ADLT (ELECTROSURGICAL) ×1 IMPLANT
GLOVE BIOGEL PI IND STRL 9 (GLOVE) ×1 IMPLANT
GLOVE SURG ORTHO 9.0 STRL STRW (GLOVE) ×1 IMPLANT
GOWN STRL REUS W/ TWL XL LVL3 (GOWN DISPOSABLE) ×2 IMPLANT
GOWN STRL REUS W/TWL XL LVL3 (GOWN DISPOSABLE) ×2
GRAFT SKIN WND MICRO 38 (Tissue) IMPLANT
KIT BASIN OR (CUSTOM PROCEDURE TRAY) ×1 IMPLANT
KIT TURNOVER KIT B (KITS) ×1 IMPLANT
MANIFOLD NEPTUNE II (INSTRUMENTS) ×1 IMPLANT
NS IRRIG 1000ML POUR BTL (IV SOLUTION) ×1 IMPLANT
PACK GENERAL/GYN (CUSTOM PROCEDURE TRAY) ×1 IMPLANT
PAD ARMBOARD 7.5X6 YLW CONV (MISCELLANEOUS) ×1 IMPLANT
PREVENA RESTOR ARTHOFORM 46X30 (CANNISTER) ×1 IMPLANT
STAPLER VISISTAT 35W (STAPLE) IMPLANT
SUT ETHILON 2 0 PSLX (SUTURE) ×2 IMPLANT
SUT SILK 2 0 (SUTURE) ×1
SUT SILK 2-0 18XBRD TIE 12 (SUTURE) IMPLANT
SUT VIC AB 1 CTX 36 (SUTURE) ×2
SUT VIC AB 1 CTX36XBRD ANBCTRL (SUTURE) IMPLANT
TOWEL GREEN STERILE (TOWEL DISPOSABLE) ×1 IMPLANT

## 2022-10-22 NOTE — H&P (Signed)
Mary Long is an 47 y.o. female.   Chief Complaint: Chronic dehiscence right above-knee amputation. HPI: Patient is a 48 year old woman with chronic anemia status post right above-knee amputation who has had complete dehiscence with exposed femur.  Patient recently has been admitted for blood transfusion and presents at this time for revision of the right above-knee amputation.  Past Medical History:  Diagnosis Date   Anemia    Bipolar 1 disorder    Blindness of left eye    Chronic osteomyelitis of right tibia 02/03/2014   Complication of anesthesia    causes manic attacks and anxiety post anesthesia   Depression    Heart failure    EF 35-40% on echo 06/2022 at [facility]   Hep C w/o coma, chronic    Illicit drug use    Osteomyelitis of right femur 01/06/2017   Seizures    15 years ago per pt    Past Surgical History:  Procedure Laterality Date   AMPUTATION Right 02/03/2014   Procedure: RIGHT AMPUTATION LEG THROUGH TIBIA/FIBULA;  Surgeon: Eulas Post, MD;  Location: [facility] OR;  Service: Orthopedics;  Laterality: Right;   AMPUTATION Right 01/12/2017   Procedure: RIGHT ABOVE KNEE AMPUTATION;  Surgeon: Nadara Mustard, MD;  Location: [facility] OR;  Service: Orthopedics;  Laterality: Right;   APPLICATION OF WOUND VAC Right 01/06/2017   Procedure: APPLICATION OF WOUND VAC;  Surgeon: Teryl Lucy, MD;  [facility];  Service: Orthopedics;  Laterality: Right;   FRACTURE SURGERY     HARDWARE REMOVAL Right 01/06/2017   Procedure: HARDWARE REMOVAL RIGHT KNEE;  Surgeon: Teryl Lucy, MD;  [facility];  Service: Orthopedics;  Laterality: Right;   I&D of 4 abscesses to right arm and wrist  06/18/2003   IR FLUORO GUIDE CV LINE RIGHT  01/06/2017   IR US GUIDE VASC ACCESS RIGHT  01/06/2017   IRRIGATION AND DEBRIDEMENT KNEE Right 01/06/2017   Procedure: IRRIGATION AND DEBRIDEMENT RIGHT KNEE AND IRRIGATION AND DEBRIDEMENT BONE;  Surgeon: Teryl Lucy, MD;  [facility];  Service: Orthopedics;   Laterality: Right;   ORIF FEMUR FRACTURE Right 12/18/2012   Procedure: OPEN REDUCTION INTERNAL FIXATION (ORIF) DISTAL FEMUR FRACTURE;  Surgeon: Eulas Post, MD;  [facility];  Service: Orthopedics;  Laterality: Right;   TUBAL LIGATION  01/01/2001    History reviewed. No pertinent family history. Social History:  reports that she has been smoking cigarettes. She has been smoking an average of .5 packs per day. She has never used smokeless tobacco. She reports that she does not currently use drugs after having used the following drugs: Cocaine and Heroin. She reports that she does not drink alcohol.  Allergies: No Known Allergies  Medications Prior to Admission  Medication Sig Dispense Refill   acetaminophen (TYLENOL) 500 MG tablet Take 2,000-2,500 mg by mouth as needed for moderate pain.     ALPRAZolam (XANAX) 1 MG tablet Take 1 mg by mouth 3 (three) times daily as needed for anxiety or sleep.     ibuprofen (ADVIL) 200 MG tablet Take 400-600 mg by mouth as needed for moderate pain.     methadone (DOLOPHINE) 10 MG/ML solution Take 83 mg by mouth daily.     OVER THE COUNTER MEDICATION Take 1 tablet by mouth daily. Blood builder      No results found for this or any previous visit (from the past 48 hour(s)). No results found.  Review of Systems  All other systems reviewed and are negative.  There were no vitals taken for this visit. Physical Exam  Patient is alert and oriented no adenopathy well-dressed normal affect normal respiratory effort.  Examination of the right lower extremity she has approximately 4 cm of exposed femur with complete dehiscence of the wound.  There is no ascending cellulitis. Assessment/Plan Assessment: Dehiscence right above-knee amputation.  Plan: Will plan for revision of the right above-knee amputation.  Risks and benefits were discussed including she has an increased risk of the wound not healing increased risk of infection increased risk of need for  additional surgery.  Patient states she understands wished to proceed at this time.  Nadara Mustard, MD 10/22/2022, 8:18 AM

## 2022-10-22 NOTE — Anesthesia Procedure Notes (Signed)
Procedure Name: LMA Insertion Date/Time: 10/22/2022 11:22 AM  Performed by: Adria Dill, CRNAPre-anesthesia Checklist: Patient identified, Emergency Drugs available, Suction available and Patient being monitored Patient Re-evaluated:Patient Re-evaluated prior to induction Oxygen Delivery Method: Circle system utilized Preoxygenation: Pre-oxygenation with 100% oxygen Induction Type: IV induction Ventilation: Mask ventilation without difficulty LMA: LMA inserted LMA Size: 4.0 Number of attempts: 1 Placement Confirmation: positive ETCO2 and breath sounds checked- equal and bilateral Tube secured with: Tape Dental Injury: Teeth and Oropharynx as per pre-operative assessment

## 2022-10-22 NOTE — Op Note (Signed)
10/22/2022  12:16 PM  PATIENT:  [name]    PRE-OPERATIVE DIAGNOSIS:  Dehiscence Right Above Knee Amputation  POST-OPERATIVE DIAGNOSIS:  Same  PROCEDURE:  RIGHT ABOVE THE KNEE AMPUTATION REVISION Application Kerecis micro graft 38 cm. Application of customizable Prevena Prevena wound VAC.  SURGEON:  Nadara Mustard, MD  PHYSICIAN ASSISTANT:None ANESTHESIA:   General  PREOPERATIVE INDICATIONS:  Mary Long is a  47 y.o. female with a diagnosis of Dehiscence Right Above Knee Amputation who failed conservative measures and elected for surgical management.    The risks benefits and alternatives were discussed with the patient preoperatively including but not limited to the risks of infection, bleeding, nerve injury, cardiopulmonary complications, the need for revision surgery, among others, and the patient was willing to proceed.  OPERATIVE IMPLANTS:   Implant Name Type Inv. Item Serial No. Manufacturer Lot No. LRB No. Used Action  GRAFT SKIN WND MICRO 38 - [id number] Tissue GRAFT SKIN WND MICRO 38  [employer] [id number] Right 1 Implanted    @ENCIMAGES @  OPERATIVE FINDINGS: Patient had a healthy contractile muscle.  The arteries were extremely calcified.  OPERATIVE PROCEDURE: Patient was brought the operating room underwent a general anesthetic.  After adequate levels anesthesia were obtained patient's right lower extremity was prepped using DuraPrep draped into a sterile field a timeout was called.  A fishmouth incision was made proximal to the area of wound dehiscence.  This was carried down to bone.  The vascular bundles were clamped and suture-ligated with 2-0 silk medially.  A oscillating saw was used to complete the revision amputation.  Electrocautery is used for further hemostasis.  The wound was filled with 38 cm of Kerecis micro graft.  The deep and superficial fascial layers were closed using #1 Vicryl the skin was closed using staples.  A Prevena customizable  wound VAC was applied this had a good suction fit patient was extubated taken the PACU in stable condition.   DISCHARGE PLANNING:  Antibiotic duration: Antibiotics for 24 hours  Weightbearing: Nonweightbearing on the right  Pain medication: Opioid pathway  Dressing care/ Wound VAC: Continue wound VAC for 1 week  Ambulatory devices: Walker and wheelchair  Discharge to: Home.  Follow-up: In the office 1 week post operative.

## 2022-10-22 NOTE — Anesthesia Procedure Notes (Signed)
Central Venous Catheter Insertion Performed by: Kaylyn Layer, MD, anesthesiologist Start/End4/04/2023 10:09 AM, 10/22/2022 10:18 AM Patient location: Pre-op. Preanesthetic checklist: patient identified, IV checked, site marked, risks and benefits discussed, surgical consent, monitors and equipment checked, pre-op evaluation, timeout performed and anesthesia consent Lidocaine 1% used for infiltration and patient sedated Hand hygiene performed  and maximum sterile barriers used  Catheter size: 8 Fr Total catheter length 16. Central line was placed.Double lumen Procedure performed using ultrasound guided technique. Ultrasound Notes:anatomy identified, needle tip was noted to be adjacent to the nerve/plexus identified, no ultrasound evidence of intravascular and/or intraneural injection and image(s) printed for medical record Attempts: 1 Following insertion, dressing applied, line sutured and Biopatch. Post procedure assessment: blood return through all ports  Patient tolerated the procedure well with no immediate complications.

## 2022-10-22 NOTE — Transfer of Care (Signed)
Immediate Anesthesia Transfer of Care Note  Patient: Mary Long  Procedure(s) Performed: RIGHT ABOVE THE KNEE AMPUTATION REVISION (Right: Leg Upper)  Patient Location: PACU  Anesthesia Type:General  Level of Consciousness: drowsy and patient cooperative  Airway & Oxygen Therapy: Patient Spontanous Breathing and Patient connected to face mask oxygen  Post-op Assessment: Report given to RN and Post -op Vital signs reviewed and stable  Post vital signs: Reviewed and stable  Last Vitals:  Vitals Value Taken Time  BP 136/59 10/22/22 1215  Temp    Pulse 75 10/22/22 1219  Resp 19 10/22/22 1219  SpO2 100 % 10/22/22 1219  Vitals shown include unvalidated device data.  Last Pain:  Vitals:   10/22/22 0858  TempSrc:   PainSc: 0-No pain         Complications: No notable events documented.

## 2022-10-22 NOTE — Progress Notes (Signed)
CRITICAL RESULT PROVIDER NOTIFICATION  Test performed and critical result:  CBC, hgb 5.8  Date and time result received:  10/22/22 @ 1113  Provider name/title: Leona Singleton CRNA  Date and time provider notified: 10/22/22 @ 1115  Date and time provider responded: 10/22/22 @ 1115  Provider response:No new orders

## 2022-10-22 NOTE — Consult Note (Addendum)
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  Significant other uses Xeroform on these at home.   Reason for Consult: L lateral leg  Wound type: full thickness  Pressure Injury POA: NA Measurement: 1.  L: leg 13 cms x 3 cms x 0.3 cms 50% pink moist 25% yellow fibrin, 25%  yellow devitalized tissue  2.  R upper arm 3 cms x 7 cms x 0.3 cms 70% pink moist 30% yellow devitalized tissue  3.  R forearm approximately 20 cms x 10 cms x 0.3 cms 70% pink moist 30% yellow slough  4.  L arm 30 cms x 6 cms x 0.5 cms 70% pink and moist 30% yellow devitalized tissue  Wounds noted to have epibole of wound edges   Drainage (amount, consistency, odor) minimal serosanguinous  Periwound:intact  Dressing procedure/placement/frequency: Clean wounds with NS, apply single layer Xeroform [place] [phone]) to wound beds daily, cover with foam dressing or Kerlix and tape to secure.    POC discussed with patient/significant other and bedside nurse.  WOC team will not follow at this time.  Re-consult if further needs arise.    Thank you,    Priscella Mann MSN, RN-BC, [employer] [phone]

## 2022-10-22 NOTE — Anesthesia Postprocedure Evaluation (Signed)
Anesthesia Post Note  Patient: JARRICA RAKERS  Procedure(s) Performed: RIGHT ABOVE THE KNEE AMPUTATION REVISION (Right: Leg Upper)     Patient location during evaluation: PACU Anesthesia Type: General Level of consciousness: awake and alert Pain management: pain level controlled Vital Signs Assessment: post-procedure vital signs reviewed and stable Respiratory status: spontaneous breathing, nonlabored ventilation and respiratory function stable Cardiovascular status: blood pressure returned to baseline Postop Assessment: no apparent nausea or vomiting Anesthetic complications: no   No notable events documented.  Last Vitals:  Vitals:   10/22/22 1245 10/22/22 1325  BP:  (!) 155/65  Pulse:  72  Resp:  18  Temp: 36.6 C 36.5 C  SpO2:  99%    Last Pain:  Vitals:   10/22/22 1325  TempSrc: Oral  PainSc:                  Shanda Howells

## 2022-10-22 NOTE — Progress Notes (Addendum)
Pt is urgently requesting to remove IV central line d/t neck pain MD notified

## 2022-10-23 LAB — CBC
HCT: 26.4 % — ABNORMAL LOW (ref 36.0–46.0)
Hemoglobin: 7.7 g/dL — ABNORMAL LOW (ref 12.0–15.0)
MCH: 21.8 pg — ABNORMAL LOW (ref 26.0–34.0)
MCHC: 29.2 g/dL — ABNORMAL LOW (ref 30.0–36.0)
MCV: 74.8 fL — ABNORMAL LOW (ref 80.0–100.0)
Platelets: 621 10*3/uL — ABNORMAL HIGH (ref 150–400)
RBC: 3.53 MIL/uL — ABNORMAL LOW (ref 3.87–5.11)
RDW: 30.3 % — ABNORMAL HIGH (ref 11.5–15.5)
WBC: 6.3 10*3/uL (ref 4.0–10.5)
nRBC: 0 % (ref 0.0–0.2)

## 2022-10-23 LAB — TYPE AND SCREEN
ABO/RH(D): A POS
Unit division: 0
Unit division: 0

## 2022-10-23 LAB — BPAM RBC
Blood Product Expiration Date: 202405012359
Blood Product Expiration Date: 202405032359

## 2022-10-23 MED ORDER — SODIUM CHLORIDE 0.9% FLUSH
10.0000 mL | Freq: Two times a day (BID) | INTRAVENOUS | Status: DC
  Administered 2022-10-23: 20 mL

## 2022-10-23 MED ORDER — SODIUM CHLORIDE 0.9% FLUSH: 10.0000 mL | INTRAVENOUS | Status: DC | PRN

## 2022-10-23 MED ORDER — CHLORHEXIDINE GLUCONATE CLOTH 2 % EX PADS
6.0000 | MEDICATED_PAD | Freq: Every day | CUTANEOUS | Status: DC
  Administered 2022-10-23: 6 via TOPICAL

## 2022-10-23 MED ORDER — OXYCODONE HCL 5 MG PO TABS: 5.0000 mg | ORAL_TABLET | ORAL | 0 refills | Status: DC | PRN

## 2022-10-23 NOTE — Evaluation (Signed)
Physical Therapy Evaluation Patient Details Name: Mary Long MRN: [id number] DOB: [birth year] Today's Date: 10/23/2022  History of Present Illness  Pt is 47 yo female presenting for planned revision of R AKA due to dehiscence that failed conservative measures. PMH: Anemia, Bipolar 1 disorder, blindness of the L eye, chronic osteomyelitis of R tibia, Depression, HF, Hep C, Osteomyelitis of R femur, seizures.  Clinical Impression  Pt is presenting close to baseline. Currently she is supervision for most functional bed mobility and transfers. She is not ambulating at this time. Pt would benefit from skilled physical therapy services 3x/week on discharge from acute care hospital setting in order to work on mobility and limb strengthening if pt goal is for a prosthetic in order to prevent contractures, immobility and further skin break down. Pt demonstrates no signs/symptoms of cardiac/respiratory distress throughout session.        Recommendations for follow up therapy are one component of a multi-disciplinary discharge planning process, led by the attending physician.  Recommendations may be updated based on patient status, additional functional criteria and insurance authorization.  Follow Up Recommendations       Assistance Recommended at Discharge Set up Supervision/Assistance  Patient can return home with the following  A little help with walking and/or transfers;Assistance with cooking/housework;Assist for transportation;Help with stairs or ramp for entrance    Equipment Recommendations None recommended by PT     Functional Status Assessment Patient has had a recent decline in their functional status and demonstrates the ability to make significant improvements in function in a reasonable and predictable amount of time.     Precautions / Restrictions Precautions Precautions: Fall Restrictions Weight Bearing Restrictions: Yes RLE Weight Bearing: Non weight bearing       Mobility  Bed Mobility Overal bed mobility: Needs Assistance Bed Mobility: Supine to Sit, Sit to Supine     Supine to sit: Supervision Sit to supine: Supervision   General bed mobility comments: Pt had increased pain with lying back in bed and required assistance to scoot up due to pain but was able to scoot up about 50% independently prior to receiving assistance for comfort and pain modulation. Patient Response: Cooperative  Transfers Overall transfer level: Needs assistance Equipment used: None Transfers: Bed to chair/wheelchair/BSC   Stand pivot transfers: Supervision      Ambulation/Gait   General Gait Details: Pt does not ambulate at baseline but is able to stand  Stairs Stairs:  (Pt states that her partner picks her up and carries her up the stairs at home. Discussed that there are other options to decrease risk of injury for pt and partner and pt states she is okay with learning but when she gets home partner will carry her.)        Balance Overall balance assessment: Needs assistance Sitting-balance support: Bilateral upper extremity supported Sitting balance-Leahy Scale: Fair Sitting balance - Comments: Pt requires bil UE support in sitting for stability due to impaired COM   Standing balance support: Bilateral upper extremity supported Standing balance-Leahy Scale: Fair Standing balance comment: Pt requires bil UE support for stand pivot transfer         Pertinent Vitals/Pain Pain Assessment Pain Assessment: 0-10 Pain Score: 6  Pain Descriptors / Indicators: Sharp Pain Intervention(s): Monitored during session    Home Living Family/patient expects to be discharged to:: Private residence Living Arrangements: Spouse/significant other Available Help at Discharge: Available PRN/intermittently Type of Home: Apartment (condo) Home Access: Stairs to enter Entrance Stairs-Rails: Doctor, general practice  of Steps: 2   Home Layout: One  level Home Equipment: Agricultural consultant (2 wheels);Cane - single point;Shower seat;Tub bench;Electric scooter;Wheelchair - manual      Prior Function Prior Level of Function : Needs assist       Physical Assist : Mobility (physical) Mobility (physical): Bed mobility   Mobility Comments: has an adjustable bed but occasionally needs a little boost to get on the bed. Pt states that she usually rolls around in an office chair. ADLs Comments: Needs help with cooking, cleaning and occasionally with other little things like fastening her bra.     Hand Dominance   Dominant Hand: Right    Extremity/Trunk Assessment   Upper Extremity Assessment Upper Extremity Assessment: Overall WFL for tasks assessed    Lower Extremity Assessment Lower Extremity Assessment: Overall WFL for tasks assessed    Cervical / Trunk Assessment Cervical / Trunk Assessment: Normal  Communication   Communication: No difficulties  Cognition Arousal/Alertness: Awake/alert Behavior During Therapy: WFL for tasks assessed/performed Overall Cognitive Status: Within Functional Limits for tasks assessed          General Comments General comments (skin integrity, edema, etc.): Pt significant other was in the room throughout session. He will be assisting her at home.        Assessment/Plan    PT Assessment Patient needs continued PT services  PT Problem List Decreased strength;Decreased activity tolerance;Decreased balance;Decreased mobility       PT Treatment Interventions DME instruction;Stair training;Therapeutic activities;Balance training;Gait training;Functional mobility training;Therapeutic exercise;Neuromuscular re-education;Wheelchair mobility training;Patient/family education    PT Goals (Current goals can be found in the Care Plan section)  Acute Rehab PT Goals Patient Stated Goal: to go home PT Goal Formulation: With patient Time For Goal Achievement: 11/06/22 Potential to Achieve Goals:  Fair    Frequency Min 3X/week        AM-PAC PT "6 Clicks" Mobility  Outcome Measure Help needed turning from your back to your side while in a flat bed without using bedrails?: None Help needed moving from lying on your back to sitting on the side of a flat bed without using bedrails?: A Little Help needed moving to and from a bed to a chair (including a wheelchair)?: A Little Help needed standing up from a chair using your arms (e.g., wheelchair or bedside chair)?: A Little Help needed to walk in hospital room?: Total Help needed climbing 3-5 steps with a railing? : Total 6 Click Score: 15    End of Session   Activity Tolerance: Patient tolerated treatment well Patient left: in bed;with call bell/phone within reach;with family/visitor present Nurse Communication: Mobility status PT Visit Diagnosis: Other abnormalities of gait and mobility (R26.89)    Time: 7564-3329 PT Time Calculation (min) (ACUTE ONLY): 24 min   Charges:   PT Evaluation $PT Eval Low Complexity: 1 Low PT Treatments $Therapeutic Activity: 8-22 mins       Harrel Carina, DPT, CLT  Acute Rehabilitation Services Office: [phone] (Secure chat preferred)   Claudia Desanctis 10/23/2022, 11:12 AM

## 2022-10-23 NOTE — Progress Notes (Signed)
Patient ID: Mary Long, female   DOB: 1975-09-03, 47 y.o.   MRN: [id number] Orders and discharge summary completed.  Order placed to discontinue the central line prior to discharge.

## 2022-10-23 NOTE — Discharge Summary (Signed)
Discharge Diagnoses:  Principal Problem:   S/P AKA (above knee amputation) unilateral, right   Surgeries: Procedure(s): RIGHT ABOVE THE KNEE AMPUTATION REVISION on 10/22/2022    Consultants:   Discharged Condition: Improved  Hospital Course: Mary Long is an 47 y.o. female who was admitted 10/22/2022 with a chief complaint of dehiscence right AKA, with a final diagnosis of Dehiscence Right Above Knee Amputation.  Patient was brought to the operating room on 10/22/2022 and underwent Procedure(s): RIGHT ABOVE THE KNEE AMPUTATION REVISION.    Patient was given perioperative antibiotics:  Anti-infectives (From admission, onward)    Start     Dose/Rate Route Frequency Ordered Stop   10/22/22 1800  ceFAZolin (ANCEF) IVPB 2g/100 mL premix        2 g 200 mL/hr over 30 Minutes Intravenous Every 8 hours 10/22/22 1316 10/23/22 0153   10/22/22 0830  ceFAZolin (ANCEF) IVPB 2g/100 mL premix        2 g 200 mL/hr over 30 Minutes Intravenous On call to O.R. 10/22/22 1700 10/22/22 1156     .  Patient was given sequential compression devices, early ambulation, and aspirin for DVT prophylaxis.  Recent vital signs: Patient Vitals for the past 24 hrs:  BP Temp Temp src Pulse Resp SpO2  10/23/22 1326 (!) 153/73 97.8 F (36.6 C) Oral 74 16 98 %  10/23/22 0939 (!) 153/97 97.7 F (36.5 C) Oral 74 17 98 %  10/22/22 1926 120/74 98.2 F (36.8 C) Oral 84 18 98 %  .  Recent laboratory studies: DG CHEST PORT 1 VIEW  Result Date: 10/22/2022 CLINICAL DATA:  Provided history: Status status post central line placement. EXAM: PORTABLE CHEST 1 VIEW COMPARISON:  Chest radiographs 04/25/2022 and earlier. FINDINGS: Right IJ approach central venous catheter with tip at the level of the superior cavoatrial junction. Heart size within normal limits. No appreciable airspace consolidation or pulmonary edema. No evidence of pleural effusion or pneumothorax. No acute osseous abnormality identified. Multiple chronic  bilateral rib fracture deformities. IMPRESSION: 1. Right IJ approach central venous catheter with tip at the level of the superior cavoatrial junction. 2. No evidence of acute cardiopulmonary abnormality. Electronically Signed   By: Jackey Loge D.O.   On: 10/22/2022 12:57    Discharge Medications:   Allergies as of 10/23/2022   No Known Allergies      Medication List     TAKE these medications    acetaminophen 500 MG tablet Commonly known as: TYLENOL Take 2,000-2,500 mg by mouth as needed for moderate pain.   ALPRAZolam 1 MG tablet Commonly known as: XANAX Take 1 mg by mouth 3 (three) times daily as needed for anxiety or sleep.   ibuprofen 200 MG tablet Commonly known as: ADVIL Take 400-600 mg by mouth as needed for moderate pain.   methadone 10 MG/ML solution Commonly known as: DOLOPHINE Take 83 mg by mouth daily.   OVER THE COUNTER MEDICATION Take 1 tablet by mouth daily. Blood builder   oxyCODONE 5 MG immediate release tablet Commonly known as: Oxy IR/ROXICODONE Take 1 tablet (5 mg total) by mouth every 4 (four) hours as needed for severe pain.        Diagnostic Studies: DG CHEST PORT 1 VIEW  Result Date: 10/22/2022 CLINICAL DATA:  Provided history: Status status post central line placement. EXAM: PORTABLE CHEST 1 VIEW COMPARISON:  Chest radiographs 04/25/2022 and earlier. FINDINGS: Right IJ approach central venous catheter with tip at the level of the superior cavoatrial junction. Heart size  within normal limits. No appreciable airspace consolidation or pulmonary edema. No evidence of pleural effusion or pneumothorax. No acute osseous abnormality identified. Multiple chronic bilateral rib fracture deformities. IMPRESSION: 1. Right IJ approach central venous catheter with tip at the level of the superior cavoatrial junction. 2. No evidence of acute cardiopulmonary abnormality. Electronically Signed   By: Jackey Loge D.O.   On: 10/22/2022 12:57   [place] EKG SITE  RITE  Result Date: 10/10/2022 If Site Rite image not attached, placement could not be confirmed due to current cardiac rhythm.   Patient benefited maximally from their hospital stay and there were no complications.     Disposition: Discharge disposition: 01-Home or Self Care      Discharge Instructions     Call MD / Call 911   Complete by: As directed    If you experience chest pain or shortness of breath, CALL 911 and be transported to the hospital emergency room.  If you develope a fever above 101 F, pus (white drainage) or increased drainage or redness at the wound, or calf pain, call your surgeon's office.   Constipation Prevention   Complete by: As directed    Drink plenty of fluids.  Prune juice may be helpful.  You may use a stool softener, such as Colace (over the counter) 100 mg twice a day.  Use MiraLax (over the counter) for constipation as needed.   Diet - low sodium heart healthy   Complete by: As directed    Increase activity slowly as tolerated   Complete by: As directed    Negative Pressure Wound Therapy - Incisional   Complete by: As directed    Attached the wound VAC dressing to a Praveena plus portable wound VAC pump.  If there is a leakage then discontinue the dressing from the pump allow the dressing to inflate and then overwrap the dressing with Ioban.  The tube does not need to be wrapped just the skin and dressing.   Post-operative opioid taper instructions:   Complete by: As directed    POST-OPERATIVE OPIOID TAPER INSTRUCTIONS: It is important to wean off of your opioid medication as soon as possible. If you do not need pain medication after your surgery it is ok to stop day one. Opioids include: Codeine, Hydrocodone(Norco, Vicodin), Oxycodone(Percocet, oxycontin) and hydromorphone amongst others.  Long term and even short term use of opiods can cause: Increased pain response Dependence Constipation Depression Respiratory depression And more.   Withdrawal symptoms can include Flu like symptoms Nausea, vomiting And more Techniques to manage these symptoms Hydrate well Eat regular healthy meals Stay active Use relaxation techniques(deep breathing, meditating, yoga) Do Not substitute Alcohol to help with tapering If you have been on opioids for less than two weeks and do not have pain than it is ok to stop all together.  Plan to wean off of opioids This plan should start within one week post op of your joint replacement. Maintain the same interval or time between taking each dose and first decrease the dose.  Cut the total daily intake of opioids by one tablet each day Next start to increase the time between doses. The last dose that should be eliminated is the evening dose.          Follow-up Information     Nadara Mustard, MD Follow up in 1 week(s).   Specialty: Orthopedic Surgery Contact information: [address] [place] [address] [phone]         Luiz Ochoa,  Clydene PughAsher, MD Follow up.   Specialty: Student Contact information: [address], [address] [place] [address] [phone]                  Signed: Nadara MustardMarcus V Jori Thrall 10/23/2022, 3:57 PM

## 2022-10-23 NOTE — Discharge Summary (Signed)
Discharge Diagnoses:  Principal Problem:   S/P AKA (above knee amputation) unilateral, right   Surgeries: Procedure(s): RIGHT ABOVE THE KNEE AMPUTATION REVISION on 10/22/2022    Consultants:   Discharged Condition: Improved  Hospital Course: Mary Long is an 47 y.o. female who was admitted 10/22/2022 with a chief complaint of dehiscence right above-the-knee amputation, with a final diagnosis of Dehiscence Right Above Knee Amputation.  Patient was brought to the operating room on 10/22/2022 and underwent Procedure(s): RIGHT ABOVE THE KNEE AMPUTATION REVISION.    Patient was given perioperative antibiotics:  Anti-infectives (From admission, onward)    Start     Dose/Rate Route Frequency Ordered Stop   10/22/22 1800  ceFAZolin (ANCEF) IVPB 2g/100 mL premix        2 g 200 mL/hr over 30 Minutes Intravenous Every 8 hours 10/22/22 1316 10/23/22 0153   10/22/22 0830  ceFAZolin (ANCEF) IVPB 2g/100 mL premix        2 g 200 mL/hr over 30 Minutes Intravenous On call to O.R. 10/22/22 1324 10/22/22 1156     .  Patient was given sequential compression devices, early ambulation, and aspirin for DVT prophylaxis.  Recent vital signs: Patient Vitals for the past 24 hrs:  BP Temp Temp src Pulse Resp SpO2  10/23/22 0939 (!) 153/97 97.7 F (36.5 C) Oral 74 17 98 %  10/22/22 1926 120/74 98.2 F (36.8 C) Oral 84 18 98 %  10/22/22 1325 (!) 155/65 97.7 F (36.5 C) Oral 72 18 99 %  10/22/22 1245 -- 97.8 F (36.6 C) -- -- -- --  .  Recent laboratory studies: DG CHEST PORT 1 VIEW  Result Date: 10/22/2022 CLINICAL DATA:  Provided history: Status status post central line placement. EXAM: PORTABLE CHEST 1 VIEW COMPARISON:  Chest radiographs 04/25/2022 and earlier. FINDINGS: Right IJ approach central venous catheter with tip at the level of the superior cavoatrial junction. Heart size within normal limits. No appreciable airspace consolidation or pulmonary edema. No evidence of pleural effusion or  pneumothorax. No acute osseous abnormality identified. Multiple chronic bilateral rib fracture deformities. IMPRESSION: 1. Right IJ approach central venous catheter with tip at the level of the superior cavoatrial junction. 2. No evidence of acute cardiopulmonary abnormality. Electronically Signed   By: Jackey Loge D.O.   On: 10/22/2022 12:57    Discharge Medications:   Allergies as of 10/23/2022   No Known Allergies      Medication List     TAKE these medications    acetaminophen 500 MG tablet Commonly known as: TYLENOL Take 2,000-2,500 mg by mouth as needed for moderate pain.   ALPRAZolam 1 MG tablet Commonly known as: XANAX Take 1 mg by mouth 3 (three) times daily as needed for anxiety or sleep.   ibuprofen 200 MG tablet Commonly known as: ADVIL Take 400-600 mg by mouth as needed for moderate pain.   methadone 10 MG/ML solution Commonly known as: DOLOPHINE Take 83 mg by mouth daily.   OVER THE COUNTER MEDICATION Take 1 tablet by mouth daily. Blood builder   oxyCODONE 5 MG immediate release tablet Commonly known as: Oxy IR/ROXICODONE Take 1 tablet (5 mg total) by mouth every 4 (four) hours as needed for severe pain.        Diagnostic Studies: DG CHEST PORT 1 VIEW  Result Date: 10/22/2022 CLINICAL DATA:  Provided history: Status status post central line placement. EXAM: PORTABLE CHEST 1 VIEW COMPARISON:  Chest radiographs 04/25/2022 and earlier. FINDINGS: Right IJ approach central venous  catheter with tip at the level of the superior cavoatrial junction. Heart size within normal limits. No appreciable airspace consolidation or pulmonary edema. No evidence of pleural effusion or pneumothorax. No acute osseous abnormality identified. Multiple chronic bilateral rib fracture deformities. IMPRESSION: 1. Right IJ approach central venous catheter with tip at the level of the superior cavoatrial junction. 2. No evidence of acute cardiopulmonary abnormality. Electronically Signed    By: Jackey Loge D.O.   On: 10/22/2022 12:57   [place] EKG SITE RITE  Result Date: 10/10/2022 If Site Rite image not attached, placement could not be confirmed due to current cardiac rhythm.   Patient benefited maximally from their hospital stay and there were no complications.     Disposition:  Discharge Instructions     Negative Pressure Wound Therapy - Incisional   Complete by: As directed    Attached the wound VAC dressing to a Praveena plus portable wound VAC pump.  If there is a leakage then discontinue the dressing from the pump allow the dressing to inflate and then overwrap the dressing with Ioban.  The tube does not need to be wrapped just the skin and dressing.       Follow-up Information     Nadara Mustard, MD Follow up in 1 week(s).   Specialty: Orthopedic Surgery Contact information: [address] [place] [address] [phone]         Rozann Lesches, MD Follow up.   Specialty: Student Contact information: [address], [address] [place] [address] [phone]                  Signed: Nadara Mustard 10/23/2022, 12:22 PM

## 2022-10-23 NOTE — Progress Notes (Signed)
Pt refused VS this morning d/t pain and wounds on arms.

## 2022-10-24 ENCOUNTER — Telehealth: Payer: Self-pay | Admitting: Radiology

## 2022-10-24 ENCOUNTER — Ambulatory Visit (HOSPITAL_COMMUNITY): Payer: Medicare Other

## 2022-10-24 LAB — TYPE AND SCREEN
Antibody Screen: NEGATIVE
Unit division: 0

## 2022-10-24 LAB — BPAM RBC
Blood Product Expiration Date: 202405012359
ISSUE DATE / TIME: 202404101122
ISSUE DATE / TIME: 202404101122
Unit Type and Rh: 6200
Unit Type and Rh: 6200
Unit Type and Rh: 6200
Unit Type and Rh: 6200

## 2022-10-24 NOTE — Progress Notes (Signed)
Patient came to office for a nurse visit due to her wound vac beeping. She has surgery on 10/22/22 and says that it has been beeping since. It showed that there was not a good seal. I did reinforce it near the lily pad and it did stop beeping. Patient waited about 5 to 10 minutes before she left. It had a good suction fit and she checked out.

## 2022-10-24 NOTE — Telephone Encounter (Signed)
Patient called triage, states that her wound vac was beeping that it was not sealed. Spoke with [place] W and advised pt to come in. Patient scheduled for 230pm today

## 2022-10-29 ENCOUNTER — Ambulatory Visit (INDEPENDENT_AMBULATORY_CARE_PROVIDER_SITE_OTHER): Payer: Medicare Other | Admitting: Family

## 2022-10-29 ENCOUNTER — Encounter: Payer: Self-pay | Admitting: Family

## 2022-10-29 DIAGNOSIS — Z89611 Acquired absence of right leg above knee: Secondary | ICD-10-CM

## 2022-10-29 NOTE — Progress Notes (Signed)
Post-Op Visit Note   Patient: Mary Long           Date of Birth: 26-Jul-1975           MRN: [id number] Visit Date: 10/29/2022 PCP: Rozann Lesches, MD  Chief Complaint:  Chief Complaint  Patient presents with   Right Leg - Routine Post Op    10/22/2022 right AKA revision     HPI:  HPI The patient is a 47 year old woman seen status post right above-knee amputation today she is on a power scooter Ortho Exam On examination of the right residual limb incision approximated with staples this is clean dry intact.  Visit Diagnoses: No diagnosis found.  Plan: Begin daily Dial soap cleansing.  Dry dressings.  Follow-up in 2 weeks for staple removal.  Follow-Up Instructions: No follow-ups on file.   Imaging: No results found.  Orders:  No orders of the defined types were placed in this encounter.  No orders of the defined types were placed in this encounter.    PMFS History: Patient Active Problem List   Diagnosis Date Noted   S/P AKA (above knee amputation) unilateral, right 10/22/2022   Symptomatic anemia 10/10/2022   Wound dehiscence 10/10/2022   Hypokalemia 10/10/2022   Lactic acidosis 10/10/2022   Chronic wound 10/10/2022   Anemia 04/26/2022   Sepsis 04/26/2022   Above knee amputation status, right 01/30/2017   Staphylococcal arthritis of right knee    Acute hematogenous osteomyelitis    MRSA infection    Osteomyelitis of right femur 01/06/2017   Fracture of distal femur 12/18/2012   Past Medical History:  Diagnosis Date   Anemia    Bipolar 1 disorder    Blindness of left eye    Chronic osteomyelitis of right tibia 02/03/2014   Complication of anesthesia    causes manic attacks and anxiety post anesthesia   Depression    Heart failure    EF 35-40% on echo 06/2022 at [facility]   Hep C w/o coma, chronic    Illicit drug use    Osteomyelitis of right femur 01/06/2017   Seizures    15 years ago per pt    No family history on file.  Past Surgical History:   Procedure Laterality Date   AMPUTATION Right 02/03/2014   Procedure: RIGHT AMPUTATION LEG THROUGH TIBIA/FIBULA;  Surgeon: Eulas Post, MD;  Location: [facility] OR;  Service: Orthopedics;  Laterality: Right;   AMPUTATION Right 01/12/2017   Procedure: RIGHT ABOVE KNEE AMPUTATION;  Surgeon: Nadara Mustard, MD;  Location[facility] OR;  Service: Orthopedics;  Laterality: Right;   APPLICATION OF WOUND VAC Right 01/06/2017   Procedure: APPLICATION OF WOUND VAC;  Surgeon: Teryl Lucy, MD;  Location: [facility] OR;  Service: Orthopedics;  Laterality: Right;   FRACTURE SURGERY     HARDWARE REMOVAL Right 01/06/2017   Procedure: HARDWARE REMOVAL RIGHT KNEE;  Surgeon: Teryl Lucy, MD;  Location: [facility] OR;  Service: Orthopedics;  Laterality: Right;   I&D of 4 abscesses to right arm and wrist  06/18/2003   IR FLUORO GUIDE CV LINE RIGHT  01/06/2017   IR US GUIDE VASC ACCESS RIGHT  01/06/2017   IRRIGATION AND DEBRIDEMENT KNEE Right 01/06/2017   Procedure: IRRIGATION AND DEBRIDEMENT RIGHT KNEE AND IRRIGATION AND DEBRIDEMENT BONE;  Surgeon: Teryl Lucy, MD;  Location: [facility] OR;  Service: Orthopedics;  Laterality: Right;   ORIF FEMUR FRACTURE Right 12/18/2012   Procedure: OPEN REDUCTION INTERNAL FIXATION (ORIF) DISTAL FEMUR FRACTURE;  Surgeon: Eulas Post,  MD;  Location: [facility] OR;  Service: Orthopedics;  Laterality: Right;   STUMP REVISION Right 10/22/2022   Procedure: RIGHT ABOVE THE KNEE AMPUTATION REVISION;  Surgeon: Nadara Mustard, MD;  Location: [facility] OR;  Service: Orthopedics;  Laterality: Right;   TUBAL LIGATION  01/01/2001   Social History   Occupational History   Not on file  Tobacco Use   Smoking status: Every Day    Packs/day: .5    Types: Cigarettes   Smokeless tobacco: Never   Tobacco comments:    smokes electronic cigarettes  Vaping Use   Vaping Use: Some days  Substance and Sexual Activity   Alcohol use: No   Drug use: Not Currently    Types: Cocaine, Heroin    Comment: History of heroin and cocaine use; none  for "years"   Sexual activity: Yes

## 2022-11-26 ENCOUNTER — Encounter: Payer: Self-pay | Admitting: Family

## 2022-11-26 ENCOUNTER — Ambulatory Visit (INDEPENDENT_AMBULATORY_CARE_PROVIDER_SITE_OTHER): Payer: Medicare Other | Admitting: Family

## 2022-11-26 ENCOUNTER — Encounter: Payer: Medicare Other | Admitting: Family

## 2022-11-26 DIAGNOSIS — Z89611 Acquired absence of right leg above knee: Secondary | ICD-10-CM

## 2022-11-26 NOTE — Progress Notes (Signed)
Post-Op Visit Note   Patient: Mary Long           Date of Birth: 08/16/1975           MRN: [id number] Visit Date: 11/26/2022 PCP: Rozann Lesches, MD  Chief Complaint:  Chief Complaint  Patient presents with   Right Leg - Routine Post Op    10/22/2022 right AKA     HPI:  HPI The patient is a 47 year old woman seen status post revision right above-knee amputation April 10.  Her staples are in place  Patient is a new right transfemoral amputee.  Patient's current comorbidities are not expected to impact the ability to function with the prescribed prosthesis. Patient verbally communicates a strong desire to use a prosthesis. Patient currently requires mobility aids to ambulate without a prosthesis.  Expects not to use mobility aids with a new prosthesis.  Patient is a K3 level ambulator that spends a lot of time walking around on uneven terrain over obstacles, up and down stairs, and ambulates with a variable cadence.  The patient will benefit from the continued use of MPK technology.  Ortho Exam On examination right above-knee amputation her incision is well-healed there is no gaping staples are in place there is no erythema or warmth  Visit Diagnoses: No diagnosis found.  Plan: Staples harvested without incident.  She will proceed with prosthesis set up given an order today.  She would like to return to her previous prosthetists in [place].  Follow-Up Instructions: No follow-ups on file.   Imaging: No results found.  Orders:  No orders of the defined types were placed in this encounter.  No orders of the defined types were placed in this encounter.    PMFS History: Patient Active Problem List   Diagnosis Date Noted   S/P AKA (above knee amputation) unilateral, right (HCC) 10/22/2022   Symptomatic anemia 10/10/2022   Wound dehiscence 10/10/2022   Hypokalemia 10/10/2022   Lactic acidosis 10/10/2022   Chronic wound 10/10/2022   Anemia 04/26/2022    Sepsis (HCC) 04/26/2022   Above knee amputation status, right 01/30/2017   Staphylococcal arthritis of right knee (HCC)    Acute hematogenous osteomyelitis (HCC)    MRSA infection    Osteomyelitis of right femur (HCC) 01/06/2017   Fracture of distal femur (HCC) 12/18/2012   Past Medical History:  Diagnosis Date   Anemia    Bipolar 1 disorder (HCC)    Blindness of left eye    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HCC) 02/03/2014   Complication of anesthesia    causes manic attacks and anxiety post anesthesia   Depression    Heart failure (HCC)    EF 35-40% on echo 06/2022 at [facility]   Hep C w/o coma, chronic (HCC)    Illicit drug use    Osteomyelitis of right femur (HCC) 01/06/2017   Seizures (HCC)    15 years ago per pt    No family history on file.  Past Surgical History:  Procedure Laterality Date   AMPUTATION Right 02/03/2014   Procedure: RIGHT AMPUTATION LEG THROUGH TIBIA/FIBULA;  Surgeon: Eulas Post, MD;  [facility];  Service: Orthopedics;  Laterality: Right;   AMPUTATION Right 01/12/2017   Procedure: RIGHT ABOVE KNEE AMPUTATION;  Surgeon: Nadara Mustard, MD;  Location: [facility] OR;  Service: Orthopedics;  Laterality: Right;   APPLICATION OF WOUND VAC Right 01/06/2017   Procedure: APPLICATION OF WOUND VAC;  Surgeon: Teryl Lucy, MD;  Location: [facility] OR;  Service: Orthopedics;  Laterality: Right;   FRACTURE SURGERY     HARDWARE REMOVAL Right 01/06/2017   Procedure: HARDWARE REMOVAL RIGHT KNEE;  Surgeon: Teryl Lucy, MD;  Location: [facility] OR;  Service: Orthopedics;  Laterality: Right;   I&D of 4 abscesses to right arm and wrist  06/18/2003   IR FLUORO GUIDE CV LINE RIGHT  01/06/2017   IR US GUIDE VASC ACCESS RIGHT  01/06/2017   IRRIGATION AND DEBRIDEMENT KNEE Right 01/06/2017   Procedure: IRRIGATION AND DEBRIDEMENT RIGHT KNEE AND IRRIGATION AND DEBRIDEMENT BONE;  Surgeon: Teryl Lucy, MD;  [facility];  Service: Orthopedics;  Laterality: Right;   ORIF FEMUR FRACTURE Right  12/18/2012   Procedure: OPEN REDUCTION INTERNAL FIXATION (ORIF) DISTAL FEMUR FRACTURE;  Surgeon: Eulas Post, MD;  [facility];  Service: Orthopedics;  Laterality: Right;   STUMP REVISION Right 10/22/2022   Procedure: RIGHT ABOVE THE KNEE AMPUTATION REVISION;  Surgeon: Nadara Mustard, MD;  Location: [facility] OR;  Service: Orthopedics;  Laterality: Right;   TUBAL LIGATION  01/01/2001   Social History   Occupational History   Not on file  Tobacco Use   Smoking status: Every Day    Packs/day: .5    Types: Cigarettes   Smokeless tobacco: Never   Tobacco comments:    smokes electronic cigarettes  Vaping Use   Vaping Use: Some days  Substance and Sexual Activity   Alcohol use: No   Drug use: Not Currently    Types: Cocaine, Heroin    Comment: History of heroin and cocaine use; none for "years"   Sexual activity: Yes

## 2022-12-10 ENCOUNTER — Encounter: Payer: Medicare Other | Admitting: Family

## 2022-12-15 ENCOUNTER — Telehealth: Payer: Self-pay | Admitting: Orthopedic Surgery

## 2022-12-15 NOTE — Telephone Encounter (Signed)
Spoke with [employer], they will be faxing order for prosthesis and needs notes. I faxed notes 10/22/22-present to  [phone]. Patients appt for prosthesis is at the end of the month. Catha Gosselin [phone]

## 2023-01-20 ENCOUNTER — Telehealth: Payer: Self-pay | Admitting: Orthopedic Surgery

## 2023-01-20 NOTE — Telephone Encounter (Signed)
Received vm from patient. She is checking on status of a fax from [employer] for a [employer] Order for her prosthesis. She has an upcoming appt with them and needs to make sure they have all the necessary paperwork. Please make sure Dr. Lajoyce Corners signs this and gets faxed back before the end of the week. Patient callback [phone]   Louanna Raw, you should have this fax, I recall seeing this. Thanks!

## 2023-01-20 NOTE — Telephone Encounter (Signed)
Form on Erin's desk to sign.

## 2023-04-13 ENCOUNTER — Emergency Department (HOSPITAL_COMMUNITY): Payer: Medicare Other

## 2023-04-13 ENCOUNTER — Other Ambulatory Visit: Payer: Self-pay

## 2023-04-13 ENCOUNTER — Encounter (HOSPITAL_COMMUNITY): Payer: Self-pay | Admitting: *Deleted

## 2023-04-13 ENCOUNTER — Inpatient Hospital Stay (HOSPITAL_COMMUNITY)
Admission: EM | Admit: 2023-04-13 | Discharge: 2023-04-13 | DRG: 812 | Discharge status: Against Medical Advice | Payer: Medicare Other | Attending: Internal Medicine | Admitting: Internal Medicine

## 2023-04-13 DIAGNOSIS — E876 Hypokalemia: Secondary | ICD-10-CM | POA: Diagnosis present

## 2023-04-13 DIAGNOSIS — D649 Anemia, unspecified: Secondary | ICD-10-CM | POA: Diagnosis present

## 2023-04-13 DIAGNOSIS — E871 Hypo-osmolality and hyponatremia: Secondary | ICD-10-CM | POA: Diagnosis present

## 2023-04-13 DIAGNOSIS — G8929 Other chronic pain: Secondary | ICD-10-CM | POA: Diagnosis present

## 2023-04-13 DIAGNOSIS — F319 Bipolar disorder, unspecified: Secondary | ICD-10-CM | POA: Diagnosis present

## 2023-04-13 DIAGNOSIS — H5462 Unqualified visual loss, left eye, normal vision right eye: Secondary | ICD-10-CM | POA: Diagnosis present

## 2023-04-13 DIAGNOSIS — Z89611 Acquired absence of right leg above knee: Secondary | ICD-10-CM

## 2023-04-13 DIAGNOSIS — B182 Chronic viral hepatitis C: Secondary | ICD-10-CM | POA: Diagnosis present

## 2023-04-13 DIAGNOSIS — F419 Anxiety disorder, unspecified: Secondary | ICD-10-CM | POA: Diagnosis present

## 2023-04-13 DIAGNOSIS — F17210 Nicotine dependence, cigarettes, uncomplicated: Secondary | ICD-10-CM | POA: Diagnosis present

## 2023-04-13 DIAGNOSIS — Z79899 Other long term (current) drug therapy: Secondary | ICD-10-CM

## 2023-04-13 DIAGNOSIS — D509 Iron deficiency anemia, unspecified: Secondary | ICD-10-CM | POA: Diagnosis present

## 2023-04-13 LAB — CBC WITH DIFFERENTIAL/PLATELET
Abs Immature Granulocytes: 0.02 10*3/uL (ref 0.00–0.07)
Basophils Absolute: 0 10*3/uL (ref 0.0–0.1)
Basophils Relative: 0 %
Eosinophils Absolute: 0 10*3/uL (ref 0.0–0.5)
Eosinophils Relative: 0 %
HCT: 7 % — ABNORMAL LOW (ref 36.0–46.0)
Hemoglobin: 1.5 g/dL — CL (ref 12.0–15.0)
Immature Granulocytes: 1 %
Lymphocytes Relative: 15 %
Lymphs Abs: 0.4 10*3/uL — ABNORMAL LOW (ref 0.7–4.0)
MCH: 12.1 pg — ABNORMAL LOW (ref 26.0–34.0)
MCHC: 21.4 g/dL — ABNORMAL LOW (ref 30.0–36.0)
MCV: 56.5 fL — ABNORMAL LOW (ref 80.0–100.0)
Monocytes Absolute: 0.3 10*3/uL (ref 0.1–1.0)
Monocytes Relative: 11 %
Neutro Abs: 1.9 10*3/uL (ref 1.7–7.7)
Neutrophils Relative %: 73 %
Platelets: 379 10*3/uL (ref 150–400)
RBC: 1.24 MIL/uL — ABNORMAL LOW (ref 3.87–5.11)
RDW: 23.2 % — ABNORMAL HIGH (ref 11.5–15.5)
WBC: 2.6 10*3/uL — ABNORMAL LOW (ref 4.0–10.5)
nRBC: 1.2 % — ABNORMAL HIGH (ref 0.0–0.2)

## 2023-04-13 LAB — COMPREHENSIVE METABOLIC PANEL
ALT: 12 U/L (ref 0–44)
AST: 33 U/L (ref 15–41)
Albumin: 2.2 g/dL — ABNORMAL LOW (ref 3.5–5.0)
Alkaline Phosphatase: 81 U/L (ref 38–126)
Anion gap: 11 (ref 5–15)
BUN: 14 mg/dL (ref 6–20)
CO2: 22 mmol/L (ref 22–32)
Calcium: 7.6 mg/dL — ABNORMAL LOW (ref 8.9–10.3)
Chloride: 98 mmol/L (ref 98–111)
Creatinine, Ser: 0.56 mg/dL (ref 0.44–1.00)
GFR, Estimated: >60 mL/min (ref >60)
Glucose, Bld: 71 mg/dL (ref 70–99)
Potassium: 2.8 mmol/L — ABNORMAL LOW (ref 3.5–5.1)
Sodium: 131 mmol/L — ABNORMAL LOW (ref 135–145)
Total Bilirubin: 0.6 mg/dL (ref 0.3–1.2)
Total Protein: 6.8 g/dL (ref 6.5–8.1)

## 2023-04-13 LAB — IRON AND TIBC
Iron: 9 ug/dL — ABNORMAL LOW (ref 28–170)
Saturation Ratios: 2 % — ABNORMAL LOW (ref 10.4–31.8)
TIBC: 414 ug/dL (ref 250–450)
UIBC: 405 ug/dL

## 2023-04-13 LAB — RETICULOCYTES
Immature Retic Fract: 16.2 % — ABNORMAL HIGH (ref 2.3–15.9)
RBC.: 1.26 MIL/uL — ABNORMAL LOW (ref 3.87–5.11)
Retic Count, Absolute: 28.9 10*3/uL (ref 19.0–186.0)
Retic Ct Pct: 2.3 % (ref 0.4–3.1)

## 2023-04-13 LAB — HEMOGLOBIN AND HEMATOCRIT, BLOOD
HCT: 13.2 % — ABNORMAL LOW (ref 36.0–46.0)
Hemoglobin: 3.5 g/dL — CL (ref 12.0–15.0)

## 2023-04-13 LAB — PREPARE RBC (CROSSMATCH)

## 2023-04-13 LAB — VITAMIN B12: Vitamin B-12: 428 pg/mL (ref 180–914)

## 2023-04-13 LAB — TROPONIN I (HIGH SENSITIVITY): Troponin I (High Sensitivity): 4 ng/L (ref <18)

## 2023-04-13 LAB — TSH: TSH: 1.929 u[IU]/mL (ref 0.350–4.500)

## 2023-04-13 LAB — FERRITIN: Ferritin: 10 ng/mL — ABNORMAL LOW (ref 11–307)

## 2023-04-13 LAB — BRAIN NATRIURETIC PEPTIDE: B Natriuretic Peptide: 572.4 pg/mL — ABNORMAL HIGH (ref 0.0–100.0)

## 2023-04-13 LAB — FOLATE: Folate: 10 ng/mL (ref >5.9)

## 2023-04-13 LAB — HIV ANTIBODY (ROUTINE TESTING W REFLEX): HIV Screen 4th Generation wRfx: NONREACTIVE

## 2023-04-13 MED ORDER — POTASSIUM CHLORIDE CRYS ER 20 MEQ PO TBCR
40.0000 meq | EXTENDED_RELEASE_TABLET | Freq: Once | ORAL | Status: AC
  Administered 2023-04-13: 40 meq via ORAL
  Filled 2023-04-13: qty 2

## 2023-04-13 MED ORDER — ACETAMINOPHEN 650 MG RE SUPP: 650.0000 mg | Freq: Four times a day (QID) | RECTAL | Status: DC | PRN

## 2023-04-13 MED ORDER — ALPRAZOLAM 0.5 MG PO TABS: 1.0000 mg | ORAL_TABLET | Freq: Two times a day (BID) | ORAL | Status: DC | PRN

## 2023-04-13 MED ORDER — METHADONE HCL 10 MG/ML PO CONC
80.0000 mg | Freq: Every day | ORAL | Status: DC
  Administered 2023-04-13: 80 mg via ORAL
  Filled 2023-04-13: qty 10

## 2023-04-13 MED ORDER — ONDANSETRON HCL 4 MG/2ML IJ SOLN: 4.0000 mg | Freq: Four times a day (QID) | INTRAMUSCULAR | Status: DC | PRN

## 2023-04-13 MED ORDER — ALBUTEROL SULFATE (2.5 MG/3ML) 0.083% IN NEBU: 2.5000 mg | INHALATION_SOLUTION | RESPIRATORY_TRACT | Status: DC | PRN

## 2023-04-13 MED ORDER — SODIUM CHLORIDE 0.9% IV SOLUTION: Freq: Once | INTRAVENOUS | Status: AC

## 2023-04-13 MED ORDER — TRAZODONE HCL 50 MG PO TABS: 25.0000 mg | ORAL_TABLET | Freq: Every evening | ORAL | Status: DC | PRN

## 2023-04-13 MED ORDER — ACETAMINOPHEN 325 MG PO TABS
650.0000 mg | ORAL_TABLET | Freq: Four times a day (QID) | ORAL | Status: DC | PRN
  Administered 2023-04-13: 650 mg via ORAL
  Filled 2023-04-13: qty 2

## 2023-04-13 MED ORDER — ONDANSETRON HCL 4 MG PO TABS: 4.0000 mg | ORAL_TABLET | Freq: Four times a day (QID) | ORAL | Status: DC | PRN

## 2023-04-13 NOTE — ED Notes (Signed)
ED TO INPATIENT HANDOFF REPORT  ED Nurse Name and Phone #:   S Name/Age/Gender Mary Long 47 y.o. female Room/Bed: WA05/WA05  Code Status   Code Status: Full Code  Home/SNF/Other Home Patient oriented to: self, place, time, and situation Is this baseline? Yes   Triage Complete: Triage complete  Chief Complaint Iron deficiency anemia [D50.9]  Triage Note Pt says she has felt weak and sob, she feels like she may need a blood transfusion, history of the same.    Allergies No Known Allergies  Level of Care/Admitting Diagnosis ED Disposition     ED Disposition  Admit   Condition  --   Comment  Hospital [facility]: [facility] [[id number]]  Level of Care: Med-Surg [16]  May admit patient to [facility] or [facility] if equivalent level of care is available:: Yes  Covid Evaluation: Asymptomatic - no recent exposure (last 10 days) testing not required  Diagnosis: Iron deficiency anemia [213315]  Admitting Physician: Maryln Gottron [[id number]]  Attending Physician: Kirby Crigler, MIR [email or web address] [[id number]]  Certification:: I certify this patient will need inpatient services for at least 2 midnights  Expected Medical Readiness: 04/15/2023          B Medical/Surgery History Past Medical History:  Diagnosis Date   Anemia    Bipolar 1 disorder (HCC)    Blindness of left eye    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HCC) 02/03/2014   Complication of anesthesia    causes manic attacks and anxiety post anesthesia   Depression    Heart failure (HCC)    EF 35-40% on echo 06/2022 at [facility]   Hep C w/o coma, chronic (HCC)    Illicit drug use    Osteomyelitis of right femur (HCC) 01/06/2017   Seizures (HCC)    15 years ago per pt   Past Surgical History:  Procedure Laterality Date   AMPUTATION Right 02/03/2014   Procedure: RIGHT AMPUTATION LEG THROUGH TIBIA/FIBULA;  Surgeon: Eulas Post, MD;  [facility];  Service: Orthopedics;  Laterality: Right;    AMPUTATION Right 01/12/2017   Procedure: RIGHT ABOVE KNEE AMPUTATION;  Surgeon: Nadara Mustard, MD;  Location: [facility] OR;  Service: Orthopedics;  Laterality: Right;   APPLICATION OF WOUND VAC Right 01/06/2017   Procedure: APPLICATION OF WOUND VAC;  Surgeon: Teryl Lucy, MD;  [facility];  Service: Orthopedics;  Laterality: Right;   FRACTURE SURGERY     HARDWARE REMOVAL Right 01/06/2017   Procedure: HARDWARE REMOVAL RIGHT KNEE;  Surgeon: Teryl Lucy, MD;  [facility];  Service: Orthopedics;  Laterality: Right;   I&D of 4 abscesses to right arm and wrist  06/18/2003   IR FLUORO GUIDE CV LINE RIGHT  01/06/2017   IR US GUIDE VASC ACCESS RIGHT  01/06/2017   IRRIGATION AND DEBRIDEMENT KNEE Right 01/06/2017   Procedure: IRRIGATION AND DEBRIDEMENT RIGHT KNEE AND IRRIGATION AND DEBRIDEMENT BONE;  Surgeon: Teryl Lucy, MD;  [facility];  Service: Orthopedics;  Laterality: Right;   ORIF FEMUR FRACTURE Right 12/18/2012   Procedure: OPEN REDUCTION INTERNAL FIXATION (ORIF) DISTAL FEMUR FRACTURE;  Surgeon: Eulas Post, MD;  [facility];  Service: Orthopedics;  Laterality: Right;   STUMP REVISION Right 10/22/2022   Procedure: RIGHT ABOVE THE KNEE AMPUTATION REVISION;  Surgeon: Nadara Mustard, MD;  Location: [facility] OR;  Service: Orthopedics;  Laterality: Right;   TUBAL LIGATION  01/01/2001     A IV Location/Drains/Wounds Patient Lines/Drains/Airways Status  Active Line/Drains/Airways     Name Placement date Placement time Site Days   Peripheral IV 04/13/23 20 G 1.88" Anterior;Proximal;Right;Upper Arm 04/13/23  0430  Arm  less than 1   Negative Pressure Wound Therapy Knee Right 01/06/17  --  --  2288   Negative Pressure Wound Therapy Leg Right 01/12/17  1700  --  2282   Negative Pressure Wound Therapy Thigh Anterior;Right 10/22/22  1158  --  173   Wound 12/18/12 Ankle Right 12/18/12  0120  Ankle  3768   Wound / Incision (Open or Dehisced) 10/10/22 Non-pressure wound Arm Anterior;Left;Lower  chronic non healing ulcer 10/10/22  0958  Arm  185   Wound / Incision (Open or Dehisced) 10/10/22 Non-pressure wound Arm Anterior;Lower;Right;Mid ulcer type of wound 10/10/22  0959  Arm  185   Wound / Incision (Open or Dehisced) 10/10/22 Non-pressure wound Knee Anterior;Left ulcer 10/10/22  1000  Knee  185   Wound / Incision (Open or Dehisced) 10/10/22 Non-pressure wound;Amputation Knee Anterior;Right visible bone 10/10/22  1000  Knee  185            Intake/Output Last 24 hours No intake or output data in the 24 hours ending 04/13/23 1059  Labs/Imaging Results for orders placed or performed during the hospital encounter of 04/13/23 (from the past 48 hour(s))  Brain natriuretic peptide     Status: Abnormal   Collection Time: 04/13/23  4:19 AM  Result Value Ref Range   B Natriuretic Peptide 572.4 (H) 0.0 - 100.0 pg/mL    Comment: Performed at [facility], [address] [place], [place] [address]  Comprehensive metabolic panel     Status: Abnormal   Collection Time: 04/13/23  4:30 AM  Result Value Ref Range   Sodium 131 (L) 135 - 145 mmol/L   Potassium 2.8 (L) 3.5 - 5.1 mmol/L   Chloride 98 98 - 111 mmol/L   CO2 22 22 - 32 mmol/L   Glucose, Bld 71 70 - 99 mg/dL    Comment: Glucose reference range applies only to samples taken after fasting for at least 8 hours.   BUN 14 6 - 20 mg/dL   Creatinine, Ser 9.14 0.44 - 1.00 mg/dL   Calcium 7.6 (L) 8.9 - 10.3 mg/dL   Total Protein 6.8 6.5 - 8.1 g/dL   Albumin 2.2 (L) 3.5 - 5.0 g/dL   AST 33 15 - 41 U/L   ALT 12 0 - 44 U/L   Alkaline Phosphatase 81 38 - 126 U/L   Total Bilirubin 0.6 0.3 - 1.2 mg/dL   GFR, Estimated >78 >29 mL/min    Comment: (NOTE) Calculated using the CKD-EPI Creatinine Equation (2021)    Anion gap 11 5 - 15    Comment: Performed at [facility], [address] [place], [place] [address]  Type and screen [facility]     Status: None (Preliminary result)    Collection Time: 04/13/23  4:30 AM  Result Value Ref Range   ABO/RH(D) A POS    Antibody Screen NEG    Sample Expiration 04/16/2023,2359    Unit Number [id number]    Blood Component Type RED CELLS,LR    Unit division 00    Status of Unit ISSUED    Transfusion Status OK TO TRANSFUSE    Crossmatch Result      Compatible Performed at [facility], [address], [place], [place] [address]    Unit Number [id number]  Blood Component Type RED CELLS,LR    Unit division 00    Status of Unit ALLOCATED    Transfusion Status OK TO TRANSFUSE    Crossmatch Result Compatible   Troponin I (High Sensitivity)     Status: None   Collection Time: 04/13/23  4:30 AM  Result Value Ref Range   Troponin I (High Sensitivity) 4 <18 ng/L    Comment: (NOTE) Elevated high sensitivity troponin I (hsTnI) values and significant  changes across serial measurements may suggest ACS but many other  chronic and acute conditions are known to elevate hsTnI results.  Refer to the "Links" section for chest pain algorithms and additional  guidance. Performed at [facility], [address] [place], [place] [address]   CBC with Differential/Platelet     Status: Abnormal   Collection Time: 04/13/23  5:32 AM  Result Value Ref Range   WBC 2.6 (L) 4.0 - 10.5 K/uL   RBC 1.24 (L) 3.87 - 5.11 MIL/uL   Hemoglobin 1.5 (LL) 12.0 - 15.0 g/dL    Comment: Reticulocyte Hemoglobin testing may be clinically indicated, consider ordering this additional test [id number] REPEATED TO VERIFY THIS CRITICAL RESULT HAS VERIFIED AND BEEN CALLED TO BLAIR [place], RN BY Kathlene November ON 09 30 2024 AT 5621, AND HAS BEEN READ BACK.     HCT 7.0 (L) 36.0 - 46.0 %   MCV 56.5 (L) 80.0 - 100.0 fL   MCH 12.1 (L) 26.0 - 34.0 pg   MCHC 21.4 (L) 30.0 - 36.0 g/dL   RDW 30.8 (H) 65.7 - 84.6 %   Platelets 379 150 - 400 K/uL   nRBC 1.2 (H) 0.0 - 0.2 %   Neutrophils Relative % 73 %   Neutro Abs 1.9 1.7 -  7.7 K/uL   Lymphocytes Relative 15 %   Lymphs Abs 0.4 (L) 0.7 - 4.0 K/uL   Monocytes Relative 11 %   Monocytes Absolute 0.3 0.1 - 1.0 K/uL   Eosinophils Relative 0 %   Eosinophils Absolute 0.0 0.0 - 0.5 K/uL   Basophils Relative 0 %   Basophils Absolute 0.0 0.0 - 0.1 K/uL   Immature Granulocytes 1 %   Abs Immature Granulocytes 0.02 0.00 - 0.07 K/uL   Tear Drop Cells PRESENT    Polychromasia PRESENT    Target Cells PRESENT    Ovalocytes PRESENT     Comment: Performed at [facility], [address] [place], [place] [address]  Prepare RBC (crossmatch)     Status: None   Collection Time: 04/13/23  5:32 AM  Result Value Ref Range   Order Confirmation      ORDER PROCESSED BY BLOOD BANK Performed at [facility], [address] [place], [place] [address]   Reticulocytes     Status: Abnormal   Collection Time: 04/13/23  5:32 AM  Result Value Ref Range   Retic Ct Pct 2.3 0.4 - 3.1 %   RBC. 1.26 (L) 3.87 - 5.11 MIL/uL   Retic Count, Absolute 28.9 19.0 - 186.0 K/uL   Immature Retic Fract 16.2 (H) 2.3 - 15.9 %    Comment: Performed at [facility], [address] [place], [place] [address]  TSH     Status: None   Collection Time: 04/13/23  6:51 AM  Result Value Ref Range   TSH 1.929 0.350 - 4.500 uIU/mL    Comment: Performed by a 3rd Generation assay with a functional sensitivity of <=0.01 uIU/mL. Performed  at [facility], [address], [place], [place] [address]   Ferritin     Status: Abnormal   Collection Time: 04/13/23  7:14 AM  Result Value Ref Range   Ferritin 10 (L) 11 - 307 ng/mL    Comment: Performed at [facility], [address], [place], [place] [address]  Folate     Status: None   Collection Time: 04/13/23  7:14 AM  Result Value Ref Range   Folate 10.0 >5.9 ng/mL    Comment: Performed at [facility], [address], [place], [place] [address]  Iron  and TIBC     Status: Abnormal   Collection Time: 04/13/23  7:14 AM  Result Value Ref Range   Iron 9 (L) 28 - 170 ug/dL   TIBC 213 086 - 578 ug/dL   Saturation Ratios 2 (L) 10.4 - 31.8 %   UIBC 405 ug/dL    Comment: Performed at [facility], [address], [place], [place] [address]  Vitamin B12     Status: None   Collection Time: 04/13/23  7:14 AM  Result Value Ref Range   Vitamin B-12 428 180 - 914 pg/mL    Comment: (NOTE) This assay is not validated for testing neonatal or myeloproliferative syndrome specimens for Vitamin B12 levels. Performed at [facility], [address], [place], [place] [address]    DG Chest Port 1 View  Result Date: 04/13/2023 CLINICAL DATA:  History of heart failure, weakness, CHF. EXAM: PORTABLE CHEST 1 VIEW COMPARISON:  10/22/2022. FINDINGS: Heart is enlarged and the mediastinal contour is within normal limits. The pulmonary vasculature is distended. No consolidation, effusion, or pneumothorax. Old rib fractures are noted bilaterally. No acute osseous abnormality. IMPRESSION: Cardiomegaly with pulmonary vascular congestion. Electronically Signed   By: Thornell Sartorius M.D.   On: 04/13/2023 04:47    Pending Labs Unresulted Labs (From admission, onward)     Start     Ordered   04/14/23 0500  Basic metabolic panel  Tomorrow morning,   R        04/13/23 0803   04/14/23 0500  CBC  Tomorrow morning,   R        04/13/23 0803   04/13/23 1300  Hemoglobin and hematocrit, blood  3 times daily,   R (with STAT occurrences)      04/13/23 0651   04/13/23 0801  HIV Antibody (routine testing w rflx)  (HIV Antibody (Routine testing w reflex) panel)  Once,   R        04/13/23 0803   04/13/23 0652  Occult blood card to lab, stool  Once,   URGENT        04/13/23 0651   04/13/23 0318  CBC with Differential  Once,   STAT        04/13/23 0318            Vitals/Pain Today's Vitals   04/13/23 0800 04/13/23 0900 04/13/23 1030  04/13/23 1043  BP: (!) 107/51 (!) 102/52 124/64 124/64  Pulse: 75 72 72 72  Resp: 11 13 11 11   Temp:    (!) 97.5 F (36.4 C)  TempSrc:    Oral  SpO2: 100% 100% 98% 98%  PainSc:        Isolation Precautions No active isolations  Medications Medications  methadone (DOLOPHINE) 10 MG/ML solution 80 mg (80 mg Oral Given 04/13/23 1011)  ALPRAZolam (XANAX) tablet 1 mg (has no administration  in time range)  acetaminophen (TYLENOL) tablet 650 mg (has no administration in time range)    Or  acetaminophen (TYLENOL) suppository 650 mg (has no administration in time range)  traZODone (DESYREL) tablet 25 mg (has no administration in time range)  ondansetron (ZOFRAN) tablet 4 mg (has no administration in time range)    Or  ondansetron (ZOFRAN) injection 4 mg (has no administration in time range)  albuterol (PROVENTIL) (2.5 MG/3ML) 0.083% nebulizer solution 2.5 mg (has no administration in time range)  0.9 %  sodium chloride infusion (Manually program via Guardrails IV Fluids) (0 mLs Intravenous Stopped 04/13/23 1044)  potassium chloride SA (KLOR-CON M) CR tablet 40 mEq (40 mEq Oral Given 04/13/23 9604)    Mobility power wheelchair     Focused Assessments    R Recommendations: See Admitting Provider Note  Report given to:   Additional Notes:

## 2023-04-13 NOTE — Discharge Instructions (Signed)
Pt is leaving AMA and states she understands she is leaving AMA.

## 2023-04-13 NOTE — ED Provider Notes (Signed)
[place] EMERGENCY DEPARTMENT AT [facility] Provider Note   CSN: [id number] Arrival date & time: 04/13/23  4098     History  Chief Complaint  Patient presents with   Weakness    Mary Long is a 47 y.o. female with a past medical history significant for anemia requiring transfusion, heart failure, AKA of the right limb, chronic wound, illicit drug use, bipolar 1, depression who presents today for weakness.  She states over the last week she has had increased weakness, dizziness, shortness of breath, blurry vision, low energy.  She states today she "got so weak that she could not put her contact in her eye ".  She denies fever, chills, hematochezia, tarry stools, vomiting, nausea.  She says this is how she normally feels when she needs a blood transfusion.   Weakness Associated symptoms: dizziness and shortness of breath   Associated symptoms: no abdominal pain, no chest pain, no diarrhea, no fever, no nausea and no vomiting        Home Medications Prior to Admission medications   Medication Sig Start Date End Date Taking? Authorizing Provider  acetaminophen (TYLENOL) 500 MG tablet Take 2,000-2,500 mg by mouth as needed for moderate pain.    [provider]  ALPRAZolam Prudy Feeler) 1 MG tablet Take 1 mg by mouth 3 (three) times daily as needed for anxiety or sleep.    [provider]  ibuprofen (ADVIL) 200 MG tablet Take 400-600 mg by mouth as needed for moderate pain.    [provider]  methadone (DOLOPHINE) 10 MG/ML solution Take 83 mg by mouth daily.    [provider]  OVER THE COUNTER MEDICATION Take 1 tablet by mouth daily. Blood International aid/development worker, Historical, MD  oxyCODONE (OXY IR/ROXICODONE) 5 MG immediate release tablet Take 1 tablet (5 mg total) by mouth every 4 (four) hours as needed for severe pain. 10/23/22   Nadara Mustard, MD      Allergies    Patient has no known allergies.    Review of Systems   Review of  Systems  Constitutional:  Positive for fatigue. Negative for fever.  Eyes:  Positive for visual disturbance.  Respiratory:  Positive for shortness of breath.   Cardiovascular:  Negative for chest pain.  Gastrointestinal:  Negative for abdominal pain, blood in stool, diarrhea, nausea and vomiting.  Neurological:  Positive for dizziness and weakness.    Physical Exam Updated Vital Signs BP 118/67   Pulse 95   Temp (!) 97.1 F (36.2 C) (Oral)   Resp 18   LMP  (LMP Unknown)   SpO2 100%  Physical Exam Vitals and nursing note reviewed.  Constitutional:      General: She is not in acute distress.    Appearance: She is well-developed.  HENT:     Head: Normocephalic and atraumatic.  Eyes:     Conjunctiva/sclera: Conjunctivae normal.     Comments: Conjunctival pallor bilateral  Cardiovascular:     Rate and Rhythm: Regular rhythm. Tachycardia present.     Heart sounds: No murmur heard.    Comments: Patient tachycardic with 2+ bilateral radial pulses. Pulmonary:     Effort: Pulmonary effort is normal. No respiratory distress.     Breath sounds: Normal breath sounds.  Abdominal:     Palpations: Abdomen is soft.     Tenderness: There is no abdominal tenderness.  Musculoskeletal:        General: No swelling.     Cervical  back: Neck supple.  Skin:    General: Skin is dry.     Capillary Refill: Capillary refill takes more than 3 seconds. Delayed cap refill    Coloration: Skin is pale.     Findings: Wound present.     Comments: Wounds to bilateral arms from xylazine use  Neurological:     Mental Status: She is alert.  Psychiatric:        Mood and Affect: Mood normal.     ED Results / Procedures / Treatments   Labs (all labs ordered are listed, but only abnormal results are displayed) Labs Reviewed  COMPREHENSIVE METABOLIC PANEL - Abnormal; Notable for the following components:      Result Value   Sodium 131 (*)    Potassium 2.8 (*)    Calcium 7.6 (*)    Albumin 2.2 (*)     All other components within normal limits  CBC WITH DIFFERENTIAL/PLATELET - Abnormal; Notable for the following components:   WBC 2.6 (*)    RBC 1.24 (*)    Hemoglobin 1.5 (*)    HCT 7.0 (*)    MCV 56.5 (*)    MCH 12.1 (*)    MCHC 21.4 (*)    RDW 23.2 (*)    nRBC 1.2 (*)    All other components within normal limits  CBC WITH DIFFERENTIAL/PLATELET  BRAIN NATRIURETIC PEPTIDE  VITAMIN B12  FOLATE  IRON AND TIBC  FERRITIN  RETICULOCYTES  TSH  TSH  I-STAT CHEM 8, ED  TYPE AND SCREEN  PREPARE RBC (CROSSMATCH)  TROPONIN I (HIGH SENSITIVITY)    EKG EKG Interpretation Date/Time:  Monday April 13 2023 04:49:14 EDT Ventricular Rate:  84 PR Interval:  137 QRS Duration:  98 QT Interval:  433 QTC Calculation: 512 R Axis:   95  Text Interpretation: Sinus rhythm Borderline right axis deviation Repol abnrm suggests ischemia, diffuse leads Prolonged QT interval Confirmed by Tilden Fossa [phone]) on 04/13/2023 4:51:12 AM  Radiology DG Chest Port 1 View  Result Date: 04/13/2023 CLINICAL DATA:  History of heart failure, weakness, CHF. EXAM: PORTABLE CHEST 1 VIEW COMPARISON:  10/22/2022. FINDINGS: Heart is enlarged and the mediastinal contour is within normal limits. The pulmonary vasculature is distended. No consolidation, effusion, or pneumothorax. Old rib fractures are noted bilaterally. No acute osseous abnormality. IMPRESSION: Cardiomegaly with pulmonary vascular congestion. Electronically Signed   By: Thornell Sartorius M.D.   On: 04/13/2023 04:47    Procedures Procedures    Medications Ordered in ED Medications  0.9 %  sodium chloride infusion (Manually program via Guardrails IV Fluids) (0 mLs Intravenous Hold 04/13/23 0549)  potassium chloride SA (KLOR-CON M) CR tablet 40 mEq (has no administration in time range)    ED Course/ Medical Decision Making/ A&P                                 Medical Decision Making Amount and/or Complexity of Data Reviewed Labs:  ordered.  This patient presents to the ED with chief complaint(s) of weakness with pertinent past medical history of anemia requiring transfusion, AKA of right lung, chronic wounds, illicit drug use, heart failure, seizures which further complicates the presenting complaint. The complaint involves an extensive differential diagnosis and also carries with it a high risk of complications and morbidity.    The differential diagnosis includes anemia, acute CHF exacerbation  Additional history obtained: Records reviewed hemoglobin trends  ED Course and Reassessment:  Lab called and asked to redraw for CBC as there machines could not read because hemoglobin so low.  Ordered i-STAT Chem-8  Independent labs interpretation:  The following labs were independently interpreted:  CBC: Hemoglobin 1.5,  CMP: Moderate hypokalemia 2.8, mild hyponatremia at 131, mild hypocalcemia at 7.6, and low albumin at 2.2 EKG: Prolonged QR, ST depression in lateral leads Troponin: 4,  Independent visualization of imaging: - I independently visualized the following imaging with scope of interpretation limited to determining acute life threatening conditions related to emergency care: Chest x-ray, which revealed cardiomegaly with pulmonary vascular congestion  Consultation: - Consulted or discussed management/test interpretation w/ external professional: Consulting hospitalist for admission.  Consideration for admission or further workup: Plan for admission for transfusions and trending hemoglobin  Patient signed out to PA [place] at the end of shift        Final Clinical Impression(s) / ED Diagnoses Final diagnoses:  Anemia, unspecified type  Hypokalemia    Rx / DC Orders ED Discharge Orders     None         Dolphus Jenny, PA-C 04/13/23 0645    Tilden Fossa, MD 04/14/23 [phone]

## 2023-04-13 NOTE — ED Triage Notes (Signed)
Pt says she has felt weak and sob, she feels like she may need a blood transfusion, history of the same.

## 2023-04-13 NOTE — Progress Notes (Signed)
IVT consult for PIV while pt is receiving blood transfusion. PIV " might slip out" and BT will be interrupted. Explained to pt about consult after speking with RN. Patient alert; oriented,family at bedside . Patient requesting to speka w/ RN first before attempts for a 2nd access is placed.

## 2023-04-13 NOTE — H&P (Signed)
History and Physical  Mary Long ZOX:[id number] DOB: [birth year] DOA: 04/13/2023  PCP: Rozann Lesches, MD   Chief Complaint: Weakness  HPI: Mary Long is a 47 y.o. 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  She intermittently receives blood transfusions, not followed by hematology but she and her partner state that she ends up going to the ER when she is symptomatic and gets transfused.  States that over the last week or so, she has had increased weakness, dizziness, shortness of breath, denies any fevers, chills, or chest pain.  No nausea or vomiting.  ED Course: Evaluation in the emergency department, temperature 97.1, blood pressure on the low end but stable.  Saturating well on room air.  Lab work was done which demonstrated WBC 2.6, hematocrit globin 1.5, platelets 379, negative troponin, BNP 572, sodium 131, potassium 2.8, normal renal function and LFTs.  ER provider has ordered 2 unit blood transfusion, and requested hospitalist admission.  Review of Systems: Please see HPI for pertinent positives and negatives. A complete 10 system review of systems are otherwise negative.  Past Medical History:  Diagnosis Date   Anemia    Bipolar 1 disorder (HCC)    Blindness of left eye    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HCC) 02/03/2014   Complication of anesthesia    causes manic attacks and anxiety post anesthesia   Depression    Heart failure (HCC)    EF 35-40% on echo 06/2022 at [facility]   Hep C w/o coma, chronic (HCC)    Illicit drug use    Osteomyelitis of right femur (HCC) 01/06/2017   Seizures (HCC)    15 years ago per pt   Past Surgical History:  Procedure Laterality Date   AMPUTATION Right  02/03/2014   Procedure: RIGHT AMPUTATION LEG THROUGH TIBIA/FIBULA;  Surgeon: Eulas Post, MD;  Location: [facility] OR;  Service: Orthopedics;  Laterality: Right;   AMPUTATION Right 01/12/2017   Procedure: RIGHT ABOVE KNEE AMPUTATION;  Surgeon: Nadara Mustard, MD;  Location: [facility] OR;  Service: Orthopedics;  Laterality: Right;   APPLICATION OF WOUND VAC Right 01/06/2017   Procedure: APPLICATION OF WOUND VAC;  Surgeon: Teryl Lucy, MD;  Location: [facility] OR;  Service: Orthopedics;  Laterality: Right;   FRACTURE SURGERY     HARDWARE REMOVAL Right 01/06/2017   Procedure: HARDWARE REMOVAL RIGHT KNEE;  Surgeon: Teryl Lucy, MD;  Location: [facility] OR;  Service: Orthopedics;  Laterality: Right;   I&D of 4 abscesses to right arm and wrist  06/18/2003   IR FLUORO GUIDE CV LINE RIGHT  01/06/2017   IR US GUIDE VASC ACCESS RIGHT  01/06/2017   IRRIGATION AND DEBRIDEMENT KNEE Right 01/06/2017   Procedure: IRRIGATION AND DEBRIDEMENT RIGHT KNEE AND IRRIGATION AND DEBRIDEMENT BONE;  Surgeon: Teryl Lucy, MD;  Location: [facility] OR;  Service: Orthopedics;  Laterality: Right;   ORIF FEMUR FRACTURE Right 12/18/2012   Procedure: OPEN REDUCTION INTERNAL FIXATION (ORIF) DISTAL FEMUR FRACTURE;  Surgeon: Eulas Post, MD;  Location: [facility] OR;  Service: Orthopedics;  Laterality: Right;   STUMP REVISION Right 10/22/2022   Procedure: RIGHT ABOVE THE KNEE AMPUTATION REVISION;  Surgeon: Nadara Mustard, MD;  Location: [facility] OR;  Service: Orthopedics;  Laterality: Right;   TUBAL LIGATION  01/01/2001  Social History:  reports that she has been smoking cigarettes. She has never used smokeless tobacco. She reports that she does not currently use drugs after having used the following drugs: Cocaine and Heroin. She reports that she does not drink alcohol.   No Known Allergies  History reviewed. No pertinent family history.   Prior to Admission medications   Medication Sig Start Date End Date Taking? Authorizing Provider  acetaminophen (TYLENOL) 500  MG tablet Take 1,000-1,500 mg by mouth as needed for moderate pain.   Yes [provider]  ALPRAZolam Prudy Feeler) 1 MG tablet Take 1 mg by mouth 2 (two) times daily as needed for anxiety or sleep.   Yes [provider]  methadone (DOLOPHINE) 10 MG/ML solution Take 80 mg by mouth daily.   Yes [provider]  oxyCODONE (OXY IR/ROXICODONE) 5 MG immediate release tablet Take 1 tablet (5 mg total) by mouth every 4 (four) hours as needed for severe pain. Patient not taking: Reported on 04/13/2023 10/23/22   Nadara Mustard, MD    Physical Exam: BP (!) 104/55   Pulse 77   Temp (!) 97.4 F (36.3 C) (Oral)   Resp 13   LMP  (LMP Unknown)   SpO2 100%   General:  Alert, oriented, calm, sleepy appearing but easily arousable.  Partner at the bedside.  She looks chronically ill but not in any acute distress.  Skin is very pale. Eyes: EOMI, clear conjuctivae Cardiovascular: RRR, no murmurs or rubs, no peripheral edema  Respiratory: clear to auscultation bilaterally, no wheezes, no crackles  Abdomen: soft, nontender, nondistended, normal bowel tones heard  Skin: dry, no rashes  Musculoskeletal: no joint effusions, normal range of motion, status post right AKA Psychiatric: appropriate affect, normal speech  Neurologic: extraocular muscles intact, clear speech, moving all extremities with intact sensorium         Labs on Admission:  Basic Metabolic Panel: Recent Labs  Lab 04/13/23 0430  NA 131*  K 2.8*  CL 98  CO2 22  GLUCOSE 71  BUN 14  CREATININE 0.56  CALCIUM 7.6*   Liver Function Tests: Recent Labs  Lab 04/13/23 0430  AST 33  ALT 12  ALKPHOS 81  BILITOT 0.6  PROT 6.8  ALBUMIN 2.2*   No results for input(s): "LIPASE", "AMYLASE" in the last 168 hours. No results for input(s): "AMMONIA" in the last 168 hours. CBC: Recent Labs  Lab 04/13/23 0532  WBC 2.6*  NEUTROABS 1.9  HGB 1.5*  HCT 7.0*  MCV 56.5*  PLT 379   Cardiac Enzymes: No results for  input(s): "CKTOTAL", "CKMB", "CKMBINDEX", "TROPONINI" in the last 168 hours.  BNP (last 3 results) Recent Labs    04/13/23 0419  BNP 572.4*    ProBNP (last 3 results) No results for input(s): "PROBNP" in the last 8760 hours.  CBG: No results for input(s): "GLUCAP" in the last 168 hours.  Radiological Exams on Admission: DG Chest Port 1 View  Result Date: 04/13/2023 CLINICAL DATA:  History of heart failure, weakness, CHF. EXAM: PORTABLE CHEST 1 VIEW COMPARISON:  10/22/2022. FINDINGS: Heart is enlarged and the mediastinal contour is within normal limits. The pulmonary vasculature is distended. No consolidation, effusion, or pneumothorax. Old rib fractures are noted bilaterally. No acute osseous abnormality. IMPRESSION: Cardiomegaly with pulmonary vascular congestion. Electronically Signed   By: Thornell Sartorius M.D.   On: 04/13/2023 04:47    Assessment/Plan Mary Long is a 47 y.o. 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    Acute on chronic anemia-patient is felt to likely have severe anemia of chronic disease.  Denies any melena, hematochezia, nausea. -Inpatient admission -Follow-up TSH, anemia panel -Transfusing 2 units of blood, anticipate further transfusion as we follow 3 times daily hemoglobin level  Hypokalemia-repleted with 40 mEq KCl, follow with daily labs  Chronic pain-continue home methadone dose confirmed by pharmacy staff  Anxiety-Home Xanax twice daily as needed  DVT prophylaxis: SCDs due to severe anemia    Code Status: Full Code  Consults called: None  Admission status: The appropriate patient status for this patient is INPATIENT. Inpatient status is judged to be reasonable and  necessary in order to provide the required intensity of service to ensure the patient's safety. The patient's presenting symptoms, physical exam findings, and initial radiographic and laboratory data in the context of their chronic comorbidities is felt to place them at high risk for further clinical deterioration. Furthermore, it is not anticipated that the patient will be medically stable for discharge from the hospital within 2 midnights of admission.    I certify that at the point of admission it is my clinical judgment that the patient will require inpatient hospital care spanning beyond 2 midnights from the point of admission due to high intensity of service, high risk for further deterioration and high frequency of surveillance required   Time spent: 49 minutes  Katena Petitjean Sharlette Dense MD [facility] Pager [phone]  If 7PM-7AM, please contact night-coverage [email or web address] Password [email or web address]  04/13/2023, 8:04 AM

## 2023-04-13 NOTE — Progress Notes (Signed)
                                                  Against Medical Advice Patient at this time expresses desire to leave the Hospital immediately, patient has been warned that this is not Medically advisable at this time, and can result in Medical complications like Death and Disability, patient understands and accepts the risks involved and assumes full responsibilty of this decision.  This patient has also been advised that if they feel the need for further medical assistance to return to any available ER or dial [phone].  Informed by Nursing staff that this patient has left care and has signed the form  Against Medical Advice on 04/13/2023 at 2131 Hrs.  Chinita Greenland BSN MSNA MSN ACNPC-AG Acute Care Nurse Practitioner [facility]

## 2023-04-13 NOTE — ED Provider Notes (Signed)
Case discussed with Dr. Janalyn Shy with [facility], anemia panel and TSH added to labs. Morning team to see.  Patient is chronically ill appearing, very pale, resp even and unlabored.    Jeannie Fend, PA-C 04/13/23 1610    Tilden Fossa, MD 04/14/23 [phone]

## 2023-04-13 NOTE — Progress Notes (Signed)
At 1820 pt was in bathroom when this RN  came into complete admission assessment. Right upper arm dressing loose, PIV dressing loose. Right arm rewrapped w/ vaseline gauze and kerlix. IV pump alarming for occlusion. PIV site noted to be 3/4 out  after Lavell removed the initial arm dressing prior to this RN redoing dressing. Primary nurse updated- will attempt to restart. Stat IV team consult placed.

## 2023-04-13 NOTE — Hospital Course (Addendum)
  47 year old female history of chronic anemia, osteomyelitis of right femur status post above-knee amputation complicated by wound dehiscence, bipolar disorder and depression please to emergency department for weakness.  Patient reported that she has increased weakness, shortness of breath, blurry vision and low energy. Her presentation to ER patient is hemodynamically stable except found to have low temperature 97.1 F.  Lab work show WBC 3.6, RBC 1.14, hemoglobin 1.5, hematocrit 7, MCV 56, platelet 379. CMP showed low sodium 131, low potassium 2.8, low calcium 7.6, low albumin 2.2 otherwise unremarkable. Chest x-ray cardiomegaly with pulmonary vascular congestion.  Pending BNP.  -Per chart review patient has 1 episodes of low hemoglobin 2.1 in March 2024.  Hemoglobin was 8-10 in 2018.  Unclear etiology of development of anemia.  Part ER provider patient does not have any active GI bleed, hemoptysis, menorrhagia, and no known source of bleeding. - In the ED 2 unit blood transfusion has been ordered. -Recommended to check anemia panel and TSH before starting the blood transfusion. - Need to consult hematology for further evaluation.  ++ Per chart review patient follows [facility] hematology clinic last clinic visit on 09/27/2019.  History of profound iron deficiency anemia with previous hemoglobin 2.9 in March 2021.  Patient was managing with IV iron infusion.  It is not clear if patient has any EGD or colonoscopy in the past or not.

## 2023-04-13 NOTE — Progress Notes (Addendum)
RN at bedside to assess PT and review plan f care. Packed RBC infusing. Pt refused second IV line stating that she is leaving after this bag of blood is infused. Provider notes states differently. RN will reach out to oncall provider for further instruction. Pt denies any acute distress.  Provider J. Garner Nash contacted making aware of pt's wish to leave AMA. Ordered to have Pt sign AMA paperwork and notify.  3rd bag PRBCs completed. Vitals retaken and updated in MAR.

## 2023-04-17 LAB — TYPE AND SCREEN
ABO/RH(D): A POS
Antibody Screen: NEGATIVE
Unit division: 0
Unit division: 0
Unit division: 0
Unit division: 0

## 2023-04-17 LAB — BPAM RBC
Blood Product Expiration Date: 202410292359
Blood Product Expiration Date: 202410292359
Blood Product Expiration Date: 202410292359
Blood Product Expiration Date: 202410302359
ISSUE DATE / TIME: 202409300654
ISSUE DATE / TIME: 202409301424
ISSUE DATE / TIME: 202409301740
Unit Type and Rh: 6200
Unit Type and Rh: 6200
Unit Type and Rh: 6200
Unit Type and Rh: 6200

## 2023-06-09 ENCOUNTER — Ambulatory Visit: Payer: Medicare Other | Admitting: Orthopedic Surgery

## 2023-07-22 ENCOUNTER — Emergency Department (HOSPITAL_COMMUNITY)
Admission: EM | Admit: 2023-07-22 | Discharge: 2023-07-22 | Discharge status: Against Medical Advice | Payer: Medicare Other | Attending: Emergency Medicine | Admitting: Emergency Medicine

## 2023-07-22 ENCOUNTER — Other Ambulatory Visit: Payer: Self-pay

## 2023-07-22 ENCOUNTER — Encounter (HOSPITAL_COMMUNITY): Payer: Self-pay

## 2023-07-22 DIAGNOSIS — Z5321 Procedure and treatment not carried out due to patient leaving prior to being seen by health care provider: Secondary | ICD-10-CM | POA: Diagnosis not present

## 2023-07-22 DIAGNOSIS — R079 Chest pain, unspecified: Secondary | ICD-10-CM | POA: Insufficient documentation

## 2023-07-22 DIAGNOSIS — R42 Dizziness and giddiness: Secondary | ICD-10-CM | POA: Insufficient documentation

## 2023-07-22 LAB — CBG MONITORING, ED: Glucose-Capillary: 111 mg/dL — ABNORMAL HIGH (ref 70–99)

## 2023-07-22 NOTE — ED Triage Notes (Signed)
 Pt arrives, states her hemoglobin is 1. Pt has not had any blood work done to indicate this. Pt states she is a lot of health problems and her provider sent her. Pt states she has intermittent dizziness and chest pain. O2 did not pick up accurately on initial vitals, O2 is 100% on RA

## 2023-07-22 NOTE — ED Notes (Signed)
 The nurse attempted to collect labs and was unsuccessful.  Patient was a very hard stick.  Had to be stuck in the upper shoulder.  Patient stated that is the one place she could be stuck because of wounds."

## 2023-07-22 NOTE — ED Notes (Addendum)
 Unable to find any place to start a peripheral IV due to wounds. Pt is suggesting to find a vein in her shoulder or abdomen

## 2023-07-22 NOTE — ED Notes (Signed)
 Pt and friend exited room and said they are leaving. Pt states she talked to her doctor and is going to be direct admitted to [facility].

## 2023-07-24 ENCOUNTER — Emergency Department (HOSPITAL_COMMUNITY)
Admission: EM | Admit: 2023-07-24 | Discharge: 2023-07-25 | Disposition: A | Discharge status: Home / Self Care | Payer: Medicare Other | Attending: Emergency Medicine | Admitting: Emergency Medicine

## 2023-07-24 ENCOUNTER — Encounter (HOSPITAL_COMMUNITY): Payer: Self-pay

## 2023-07-24 ENCOUNTER — Other Ambulatory Visit: Payer: Self-pay

## 2023-07-24 DIAGNOSIS — D649 Anemia, unspecified: Secondary | ICD-10-CM | POA: Diagnosis present

## 2023-07-24 DIAGNOSIS — Z5329 Procedure and treatment not carried out because of patient's decision for other reasons: Secondary | ICD-10-CM | POA: Insufficient documentation

## 2023-07-24 LAB — CBC WITH DIFFERENTIAL/PLATELET
Abs Immature Granulocytes: 0.02 10*3/uL (ref 0.00–0.07)
Basophils Absolute: 0 10*3/uL (ref 0.0–0.1)
Basophils Relative: 0 %
Eosinophils Absolute: 0 10*3/uL (ref 0.0–0.5)
Eosinophils Relative: 0 %
HCT: 8.2 % — ABNORMAL LOW (ref 36.0–46.0)
Hemoglobin: 1.9 g/dL — CL (ref 12.0–15.0)
Immature Granulocytes: 0 %
Lymphocytes Relative: 17 %
Lymphs Abs: 0.9 10*3/uL (ref 0.7–4.0)
MCH: 13.2 pg — ABNORMAL LOW (ref 26.0–34.0)
MCHC: 23.2 g/dL — ABNORMAL LOW (ref 30.0–36.0)
MCV: 56.9 fL — ABNORMAL LOW (ref 80.0–100.0)
Monocytes Absolute: 0.4 10*3/uL (ref 0.1–1.0)
Monocytes Relative: 7 %
Neutro Abs: 3.9 10*3/uL (ref 1.7–7.7)
Neutrophils Relative %: 76 %
Platelet Morphology: NORMAL
Platelets: 379 10*3/uL (ref 150–400)
RBC: 1.44 MIL/uL — ABNORMAL LOW (ref 3.87–5.11)
RDW: 25.7 % — ABNORMAL HIGH (ref 11.5–15.5)
WBC: 5.2 10*3/uL (ref 4.0–10.5)
nRBC: 1 % — ABNORMAL HIGH (ref 0.0–0.2)

## 2023-07-24 LAB — COMPREHENSIVE METABOLIC PANEL
ALT: 12 U/L (ref 0–44)
AST: 27 U/L (ref 15–41)
Albumin: 2.3 g/dL — ABNORMAL LOW (ref 3.5–5.0)
Alkaline Phosphatase: 82 U/L (ref 38–126)
Anion gap: 8 (ref 5–15)
BUN: 8 mg/dL (ref 6–20)
CO2: 23 mmol/L (ref 22–32)
Calcium: 7.3 mg/dL — ABNORMAL LOW (ref 8.9–10.3)
Chloride: 103 mmol/L (ref 98–111)
Creatinine, Ser: 0.36 mg/dL — ABNORMAL LOW (ref 0.44–1.00)
GFR, Estimated: >60 mL/min (ref >60)
Glucose, Bld: 60 mg/dL — ABNORMAL LOW (ref 70–99)
Potassium: 3 mmol/L — ABNORMAL LOW (ref 3.5–5.1)
Sodium: 134 mmol/L — ABNORMAL LOW (ref 135–145)
Total Bilirubin: 0.4 mg/dL (ref 0.0–1.2)
Total Protein: 6.8 g/dL (ref 6.5–8.1)

## 2023-07-24 LAB — PREPARE RBC (CROSSMATCH)

## 2023-07-24 MED ORDER — SODIUM CHLORIDE 0.9% IV SOLUTION: Freq: Once | INTRAVENOUS | Status: AC

## 2023-07-24 MED ORDER — ACETAMINOPHEN 500 MG PO TABS
1000.0000 mg | ORAL_TABLET | Freq: Once | ORAL | Status: AC
  Administered 2023-07-24: 1000 mg via ORAL
  Filled 2023-07-24: qty 2

## 2023-07-24 MED ORDER — POTASSIUM CHLORIDE CRYS ER 20 MEQ PO TBCR
40.0000 meq | EXTENDED_RELEASE_TABLET | Freq: Once | ORAL | Status: AC
  Administered 2023-07-24: 40 meq via ORAL
  Filled 2023-07-24: qty 2

## 2023-07-24 NOTE — Discharge Instructions (Signed)
It was a pleasure caring for you today in the emergency department.  Please follow up with your PCP  Please return to the emergency department for any worsening or worrisome symptoms.

## 2023-07-24 NOTE — ED Provider Notes (Signed)
 Patient signed out to me by previous provider. Please refer to their note for full HPI.  Briefly this is a 48 year old female with chronic anemia and other comorbidities.  Following at [facility].  Here for worsening symptoms and found to be anemic from her baseline.  Patient is adamantly refusing admission and further treatment outside of transfusion.  Type and screen has been ordered.  Will plan to transfuse.  Patient currently receiving second unit of blood, tolerating well. Asking staff to speed up the transfusion rate. Continues to refuse admission. Will plan to complete transfusion and AMA.   Bari Roxie HERO, DO 07/24/23 2222

## 2023-07-24 NOTE — ED Triage Notes (Addendum)
 PT arrives via POV. PT reports she was seen here on the 8th but had to leave. She reports she believes her Hgb is low. States they were unable to obtain blood work yesterday. Pt arrives AxOx4. She is pale. Denies any source of bleeding. PT reports chronic wounds all over.

## 2023-07-24 NOTE — ED Notes (Signed)
 IV team request placed an little over an hour ago   havent had anyone to come and do the IV as of yet

## 2023-07-24 NOTE — ED Notes (Signed)
 IV team came and doing an IV on this patient is not possible

## 2023-07-24 NOTE — Progress Notes (Signed)
 Unable to find place for PIV due to wounds all over both forearm and upper arm.

## 2023-07-24 NOTE — ED Provider Notes (Signed)
 [place] EMERGENCY DEPARTMENT AT [facility] Provider Note  CSN: [id number] Arrival date & time: 07/24/23 9340  Chief Complaint(s) Fatigue and Abnormal Lab  HPI Mary Long is a 48 y.o. 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   Patient follows primarily with [facility], she follows with Dr. Riccardo with infectious disease and is planning for direct admission when a bed is available to further evaluate sources of her anemia.  Plan for egd/colonoscopy that time.  She was advised to go to the ER on 1/3 by her specialist for blood transfusion, report that she has frequent bad experience in the ER and typically will eat within a day or 2 of admission if she is admitted.  Have been struggling with outpatient resources for the patient due to her limited mobility secondary to right leg amputation.  She has chronic wounds to her upper extremities that she reports is unchanged from baseline, no fevers, no chills, no nausea or vomiting.  She is pending evaluation by wound clinic in the outpatient setting.  She is also apparently looking into care at the [facility] in [place]. She is adamantly that she only wants to be evaluated for a blood transfusion and nothing further, she is preemptively refusing admission at this point   Past Medical History Past Medical History:  Diagnosis Date   Anemia    Bipolar 1 disorder (HCC)    Blindness of left eye    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HCC) 02/03/2014   Complication of anesthesia    causes manic attacks and anxiety post anesthesia   Depression    Heart failure (HCC)    EF 35-40% on echo 06/2022 at [facility]   Hep C w/o coma, chronic (HCC)    Illicit drug use    Osteomyelitis of right femur (HCC) 01/06/2017   Seizures (HCC)    15 years ago per pt   Patient Active Problem List   Diagnosis Date Noted   Iron  deficiency anemia 04/13/2023   S/P AKA (above knee amputation) unilateral, right (HCC) 10/22/2022   Symptomatic anemia 10/10/2022   Wound dehiscence 10/10/2022   Hypokalemia 10/10/2022   Lactic acidosis 10/10/2022   Chronic wound 10/10/2022   Anemia 04/26/2022   Sepsis (HCC) 04/26/2022   Above knee amputation status, right 01/30/2017   Staphylococcal arthritis of right knee (HCC)    Acute hematogenous osteomyelitis (HCC)    MRSA infection    Osteomyelitis of right femur (HCC) 01/06/2017   Fracture of distal femur (HCC) 12/18/2012   Home Medication(s) Prior to Admission medications   Medication Sig Start Date End Date Taking? Authorizing Provider  acetaminophen  (TYLENOL ) 500 MG tablet Take 1,000-1,500 mg by mouth as needed for moderate pain.    [provider]  ALPRAZolam  (XANAX ) 1 MG tablet Take 1 mg by mouth 2 (two) times daily as needed for anxiety or sleep.    [provider]  methadone  (DOLOPHINE ) 10 MG/ML solution Take 80 mg by mouth daily.    [provider]  oxyCODONE  (OXY IR/ROXICODONE ) 5 MG immediate release tablet Take 1 tablet (5 mg total) by mouth every 4 (four) hours as needed for severe pain. Patient not taking: Reported on 04/13/2023 10/23/22   Harden Jerona GAILS, MD  Past Surgical History Past Surgical History:  Procedure Laterality Date   AMPUTATION Right 02/03/2014   Procedure: RIGHT AMPUTATION LEG THROUGH TIBIA/FIBULA;  Surgeon: Fonda SHAUNNA Olmsted, MD;  Location: [facility] OR;  Service: Orthopedics;  Laterality: Right;   AMPUTATION Right 01/12/2017   Procedure: RIGHT ABOVE KNEE AMPUTATION;  Surgeon: Harden Jerona GAILS, MD;  Location: [facility] OR;  Service: Orthopedics;  Laterality: Right;   APPLICATION OF WOUND VAC Right 01/06/2017   Procedure: APPLICATION OF WOUND VAC;  Surgeon: Olmsted Fonda, MD;  Location: [facility] OR;  Service: Orthopedics;   Laterality: Right;   FRACTURE SURGERY     HARDWARE REMOVAL Right 01/06/2017   Procedure: HARDWARE REMOVAL RIGHT KNEE;  Surgeon: Olmsted Fonda, MD;  Location: [facility] OR;  Service: Orthopedics;  Laterality: Right;   I&D of 4 abscesses to right arm and wrist  06/18/2003   IR FLUORO GUIDE CV LINE RIGHT  01/06/2017   IR US  GUIDE VASC ACCESS RIGHT  01/06/2017   IRRIGATION AND DEBRIDEMENT KNEE Right 01/06/2017   Procedure: IRRIGATION AND DEBRIDEMENT RIGHT KNEE AND IRRIGATION AND DEBRIDEMENT BONE;  Surgeon: Olmsted Fonda, MD;  Location: [facility] OR;  Service: Orthopedics;  Laterality: Right;   ORIF FEMUR FRACTURE Right 12/18/2012   Procedure: OPEN REDUCTION INTERNAL FIXATION (ORIF) DISTAL FEMUR FRACTURE;  Surgeon: Fonda SHAUNNA Olmsted, MD;  Location: [facility] OR;  Service: Orthopedics;  Laterality: Right;   STUMP REVISION Right 10/22/2022   Procedure: RIGHT ABOVE THE KNEE AMPUTATION REVISION;  Surgeon: Harden Jerona GAILS, MD;  Location: [facility] OR;  Service: Orthopedics;  Laterality: Right;   TUBAL LIGATION  01/01/2001   Family History Family History  Problem Relation Age of Onset   Bladder Cancer Father     Social History Social History   Tobacco Use   Smoking status: Every Day    Current packs/day: 0.50    Types: Cigarettes   Smokeless tobacco: Never   Tobacco comments:    smokes electronic cigarettes  Vaping Use   Vaping status: Some Days  Substance Use Topics   Alcohol use: No   Drug use: Not Currently    Types: Cocaine, Heroin    Comment: History of heroin and cocaine use; none for years   Allergies Patient has no known allergies.  Review of Systems Review of Systems  Constitutional:  Positive for fatigue. Negative for chills and fever.  Respiratory:  Negative for chest tightness and shortness of breath.   Cardiovascular:  Negative for chest pain and palpitations.  Gastrointestinal:  Negative for abdominal pain and vomiting.  Genitourinary:  Negative for difficulty urinating and urgency.  Skin:  Positive  for wound.  Neurological:  Negative for speech difficulty.  All other systems reviewed and are negative.   Physical Exam Vital Signs  I have reviewed the triage vital signs BP 105/67   Pulse 84   Temp 98.3 F (36.8 C) (Oral)   Resp 18   Ht 5' (1.524 m)   Wt 31.8 kg   LMP  (LMP Unknown) Comment: more than a year ago  SpO2 100%   BMI 13.67 kg/m  Physical Exam Vitals and nursing note reviewed.  Constitutional:      General: She is not in acute distress.    Appearance: She is ill-appearing.     Comments: Cachectic  HENT:     Head: Normocephalic and atraumatic.     Right Ear: External ear normal.     Left Ear: External ear normal.     Nose: Nose normal.  Mouth/Throat:     Mouth: Mucous membranes are moist.  Eyes:     General: No scleral icterus.       Right eye: No discharge.        Left eye: No discharge.  Cardiovascular:     Rate and Rhythm: Normal rate and regular rhythm.     Pulses: Normal pulses.     Heart sounds: Normal heart sounds.  Pulmonary:     Effort: Pulmonary effort is normal. No respiratory distress.     Breath sounds: Normal breath sounds. No stridor.  Abdominal:     General: Abdomen is flat. There is no distension.     Palpations: Abdomen is soft.     Tenderness: There is no abdominal tenderness.  Musculoskeletal:     Cervical back: No rigidity.     Right lower leg: No edema.     Left lower leg: No edema.  Skin:    General: Skin is warm and dry.     Capillary Refill: Capillary refill takes less than 2 seconds.     Coloration: Skin is jaundiced.     Comments: Numerous wounds to her upper extremities, many are draining  Neurological:     Mental Status: She is alert.  Psychiatric:        Mood and Affect: Mood normal.        Behavior: Behavior normal. Behavior is cooperative.     ED Results and Treatments Labs (all labs ordered are listed, but only abnormal results are displayed) Labs Reviewed  CBC WITH DIFFERENTIAL/PLATELET - Abnormal;  Notable for the following components:      Result Value   RBC 1.44 (*)    Hemoglobin 1.9 (*)    HCT 8.2 (*)    MCV 56.9 (*)    MCH 13.2 (*)    MCHC 23.2 (*)    RDW 25.7 (*)    nRBC 1.0 (*)    All other components within normal limits  COMPREHENSIVE METABOLIC PANEL - Abnormal; Notable for the following components:   Sodium 134 (*)    Potassium 3.0 (*)    Glucose, Bld 60 (*)    Creatinine, Ser 0.36 (*)    Calcium 7.3 (*)    Albumin 2.3 (*)    All other components within normal limits  PROTIME-INR  PREPARE RBC (CROSSMATCH)  TYPE AND SCREEN                                                                                                                          Radiology No results found.  Pertinent labs & imaging results that were available during my care of the patient were reviewed by me and considered in my medical decision making (see MDM for details).  Medications Ordered in ED Medications  0.9 %  sodium chloride  infusion (Manually program via Guardrails IV Fluids) ( Intravenous New Bag/Given 07/24/23 1420)  potassium chloride  SA (KLOR-CON  M) CR tablet 40 mEq (40 mEq Oral Given 07/24/23 1415)  Procedures .Critical Care  Performed by: Elnor Jayson LABOR, DO Authorized by: Elnor Jayson LABOR, DO   Critical care provider statement:    Critical care time (minutes):  42   Critical care time was exclusive of:  Separately billable procedures and treating other patients   Critical care was necessary to treat or prevent imminent or life-threatening deterioration of the following conditions:  Circulatory failure   Critical care was time spent personally by me on the following activities:  Development of treatment plan with patient or surrogate, discussions with consultants, evaluation of patient's response to treatment, examination of patient, ordering and review  of laboratory studies, ordering and performing treatments and interventions, pulse oximetry, re-evaluation of patient's condition, review of old charts and obtaining history from patient or surrogate   (including critical care time)  Medical Decision Making / ED Course    Medical Decision Making:    KORRIE HOFBAUER is a 48 y.o. 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 . The complaint involves an extensive differential diagnosis and also carries with it a high risk of complications and morbidity.  Serious etiology was considered. Ddx includes but is not limited to: metabolic derangement, endocrine derangement, infectious, medication effect, etc  Complete initial physical exam performed, notably the patient was in no immediate distress, blood pressure was borderline although she is very small, she is jaundiced, multiple wounds to her extremities that are unchanged per the patient.    Reviewed and confirmed nursing documentation for past medical history, family history, social history.  Vital signs reviewed.      Clinical Course as of 07/24/23 1621  Fri Jul 24, 2023  1234 Hemoglobin(!!): 1.9 Baseline from 1-4 per ID documentation, will give PRBC. I did strongly recommend admission, pt refuses admission [SG]    Clinical Course User Index [SG] Elnor Jayson LABOR, DO    Brief summary: 48 year old female with history above including HFrEF, chronic anemia, methadone  use here with fatigue, seeking blood transfusion.  She is adamantly refusing admission, reports that she only wants to be evaluated for her anemia as they are planning potential admission at [facility] in the near future via her ID specialist dr Riccardo.   Hemoglobin is very low, chronically low; she is fatigued today. At least some component of symptomatic anemia is presumed today. Patient consented  for blood transfusion.  Handoff to incoming EDP pending blood transfusion.  Patient is refusing any further workup, dispo will likely be leaving against medical advice. I advised her to f/u with her specialists at [facility] for further care.               Additional history obtained: -Additional history obtained from spouse -External records from outside source obtained and reviewed including: Chart review including previous notes, labs, imaging, consultation notes including  [place] records, prior labs/imaging/ prior admission   Lab Tests: -I ordered, reviewed, and interpreted labs.   The pertinent results include:   Labs Reviewed  CBC WITH DIFFERENTIAL/PLATELET - Abnormal; Notable for the following components:      Result Value   RBC 1.44 (*)    Hemoglobin 1.9 (*)    HCT 8.2 (*)    MCV 56.9 (*)    MCH 13.2 (*)    MCHC 23.2 (*)    RDW 25.7 (*)    nRBC 1.0 (*)    All other components within  normal limits  COMPREHENSIVE METABOLIC PANEL - Abnormal; Notable for the following components:   Sodium 134 (*)    Potassium 3.0 (*)    Glucose, Bld 60 (*)    Creatinine, Ser 0.36 (*)    Calcium 7.3 (*)    Albumin 2.3 (*)    All other components within normal limits  PROTIME-INR  PREPARE RBC (CROSSMATCH)  TYPE AND SCREEN    Notable for hgb critically low  EKG   EKG Interpretation Date/Time:    Ventricular Rate:    PR Interval:    QRS Duration:    QT Interval:    QTC Calculation:   R Axis:      Text Interpretation:           Imaging Studies ordered: na   Medicines ordered and prescription drug management: Meds ordered this encounter  Medications   0.9 %  sodium chloride  infusion (Manually program via Guardrails IV Fluids)   potassium chloride  SA (KLOR-CON  M) CR tablet 40 mEq    -I have reviewed the patients home medicines and have made adjustments as needed   Consultations Obtained: na   Cardiac Monitoring: The patient was maintained on a cardiac monitor.  I  personally viewed and interpreted the cardiac monitored which showed an underlying rhythm of: NSR Continuous pulse oximetry interpreted by myself, 98% on RA.    Social Determinants of Health:  Diagnosis or treatment significantly limited by social determinants of health: current smoker Counseled patient for approximately 3 minutes regarding smoking cessation. Discussed risks of smoking and how they applied and affected their visit here today. Patient not ready to quit at this time, however will follow up with their primary doctor when they are.   CPT code: 00593: intermediate counseling for smoking cessation     Reevaluation: After the interventions noted above, I reevaluated the patient and found that they have stayed the same  Co morbidities that complicate the patient evaluation  Past Medical History:  Diagnosis Date   Anemia    Bipolar 1 disorder (HCC)    Blindness of left eye    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HCC) 02/03/2014   Complication of anesthesia    causes manic attacks and anxiety post anesthesia   Depression    Heart failure (HCC)    EF 35-40% on echo 06/2022 at [facility]   Hep C w/o coma, chronic (HCC)    Illicit drug use    Osteomyelitis of right femur (HCC) 01/06/2017   Seizures (HCC)    15 years ago per pt      Dispostion: Disposition decision including need for hospitalization was considered, and patient disposition pending at time of sign out.    Final Clinical Impression(s) / ED Diagnoses Final diagnoses:  None        Elnor Jayson LABOR, DO 07/24/23 1621

## 2023-07-25 NOTE — ED Provider Notes (Signed)
 Patient continues to refuse admission.  Two units of blood have been infused.  Patient to leave AGAINST MEDICAL ADVICE.   Shon Baton, MD 07/25/23 0000

## 2023-07-25 NOTE — ED Notes (Signed)
 Pt requested to leave after her transfusion was finished. Provider notified of pt request; provider put in pt discharge.

## 2023-07-27 LAB — TYPE AND SCREEN
ABO/RH(D): A POS
Antibody Screen: NEGATIVE
Unit division: 0
Unit division: 0

## 2023-07-27 LAB — BPAM RBC
Blood Product Expiration Date: 202502072359
Blood Product Expiration Date: 202502072359
ISSUE DATE / TIME: 202501101728
ISSUE DATE / TIME: 202501102027
Unit Type and Rh: 6200
Unit Type and Rh: 6200

## 2023-11-12 ENCOUNTER — Encounter (HOSPITAL_BASED_OUTPATIENT_CLINIC_OR_DEPARTMENT_OTHER): Admitting: General Surgery

## 2023-11-30 ENCOUNTER — Other Ambulatory Visit: Payer: Self-pay

## 2023-11-30 ENCOUNTER — Emergency Department (HOSPITAL_COMMUNITY)
Admission: EM | Admit: 2023-11-30 | Discharge: 2023-12-01 | Discharge status: Against Medical Advice | Attending: Emergency Medicine | Admitting: Emergency Medicine

## 2023-11-30 ENCOUNTER — Encounter (HOSPITAL_COMMUNITY): Payer: Self-pay | Admitting: *Deleted

## 2023-11-30 DIAGNOSIS — D649 Anemia, unspecified: Secondary | ICD-10-CM | POA: Insufficient documentation

## 2023-11-30 DIAGNOSIS — Z5329 Procedure and treatment not carried out because of patient's decision for other reasons: Secondary | ICD-10-CM | POA: Insufficient documentation

## 2023-11-30 LAB — CBC
HCT: 16.4 % — ABNORMAL LOW (ref 36.0–46.0)
Hemoglobin: 3.7 g/dL — CL (ref 12.0–15.0)
MCH: 13.1 pg — ABNORMAL LOW (ref 26.0–34.0)
MCHC: 22.6 g/dL — ABNORMAL LOW (ref 30.0–36.0)
MCV: 58.2 fL — ABNORMAL LOW (ref 80.0–100.0)
Platelets: 414 10*3/uL — ABNORMAL HIGH (ref 150–400)
RBC: 2.82 MIL/uL — ABNORMAL LOW (ref 3.87–5.11)
RDW: 23.3 % — ABNORMAL HIGH (ref 11.5–15.5)
WBC: 1.6 10*3/uL — ABNORMAL LOW (ref 4.0–10.5)
nRBC: 1.9 % — ABNORMAL HIGH (ref 0.0–0.2)

## 2023-11-30 LAB — COMPREHENSIVE METABOLIC PANEL WITH GFR
ALT: 11 U/L (ref 0–44)
AST: 19 U/L (ref 15–41)
Albumin: 2 g/dL — ABNORMAL LOW (ref 3.5–5.0)
Alkaline Phosphatase: 80 U/L (ref 38–126)
Anion gap: 7 (ref 5–15)
BUN: 12 mg/dL (ref 6–20)
CO2: 26 mmol/L (ref 22–32)
Calcium: 7.4 mg/dL — ABNORMAL LOW (ref 8.9–10.3)
Chloride: 106 mmol/L (ref 98–111)
Creatinine, Ser: 0.45 mg/dL (ref 0.44–1.00)
GFR, Estimated: >60 mL/min (ref >60)
Glucose, Bld: 90 mg/dL (ref 70–99)
Potassium: 3.1 mmol/L — ABNORMAL LOW (ref 3.5–5.1)
Sodium: 139 mmol/L (ref 135–145)
Total Bilirubin: 0.5 mg/dL (ref 0.0–1.2)
Total Protein: 6.6 g/dL (ref 6.5–8.1)

## 2023-11-30 LAB — PREPARE RBC (CROSSMATCH)

## 2023-11-30 MED ORDER — POTASSIUM CHLORIDE CRYS ER 20 MEQ PO TBCR
40.0000 meq | EXTENDED_RELEASE_TABLET | Freq: Once | ORAL | Status: AC
  Administered 2023-11-30: 40 meq via ORAL
  Filled 2023-11-30: qty 2

## 2023-11-30 MED ORDER — SODIUM CHLORIDE 0.9% IV SOLUTION: Freq: Once | INTRAVENOUS | Status: AC

## 2023-11-30 NOTE — ED Triage Notes (Addendum)
 BIB family from home for anemia. Informed hgb is 2.4 ow lower. H/o similar. Instructed to come to ED for transfusion. Wants it as fast as possible so she can leave. Endorses sob, general weakness, dizziness. Mentions chronic pain. Alert, NAD, calm, interactive, speaking in clear complete sentences, skin W&D, resps e/u. Denies bleeding.

## 2023-11-30 NOTE — Discharge Instructions (Signed)
 You were seen today for concerns of weakness and dizziness. Your hemoglobin was critically low and you were given two units of blood. You are choosing to leave AGAINST MEDICAL ADVICE at this time. Please return to the ER for any concerns of worsening symptoms.

## 2023-11-30 NOTE — ED Notes (Addendum)
 Pt accidentally dislodged her EJ and loss access. Attempting to gain access again by US .

## 2023-11-30 NOTE — ED Notes (Signed)
 12 lead applied to Pt.

## 2023-11-30 NOTE — ED Provider Notes (Signed)
 Accepted handoff at shift change from [place], [place]. Please see prior provider note for more detail.   Briefly: Patient is 48 y.o.   DDX: concern for symptomatic anemia, hemorrhage, DIC, iron-deficiency anemia  Plan: Will likely require blood transfusion but awaiting labs to confirm need for transfusion. Patient has routinely refused admission in the past for similar concerns.   Physical Exam  BP (!) 151/61   Pulse 85   Temp 97.7 F (36.5 C) (Oral)   Resp 13   Wt 31.8 kg   LMP  (LMP Unknown) Comment: more than a year ago  SpO2 97%   BMI 13.67 kg/m   Physical Exam Vitals and nursing note reviewed.  Constitutional:      General: She is not in acute distress.    Appearance: She is well-developed.  HENT:     Head: Normocephalic and atraumatic.     Nose: Nose normal.     Mouth/Throat:     Mouth: Mucous membranes are moist.  Eyes:     Extraocular Movements: Extraocular movements intact.     Conjunctiva/sclera: Conjunctivae normal.  Cardiovascular:     Rate and Rhythm: Normal rate and regular rhythm.     Pulses: Normal pulses.     Heart sounds: No murmur heard. Pulmonary:     Effort: Pulmonary effort is normal. No respiratory distress.     Breath sounds: Normal breath sounds.  Abdominal:     Palpations: Abdomen is soft.     Tenderness: There is no abdominal tenderness.  Musculoskeletal:        General: No swelling.     Cervical back: Neck supple.     Comments: Right AKA noted, +1 pitting edema on left lower extremity.  Patient neurovascularly intact.  Skin:    General: Skin is warm and dry.     Capillary Refill: Capillary refill takes less than 2 seconds.     Coloration: Skin is pale.     Comments: Patient with bilateral arm sleeves covering chronic wounds.  Wounds appear without infection, no purulent discharge or warmth on exam.  Neurological:     General: No focal deficit present.     Mental Status: She is alert.  Psychiatric:        Mood and Affect: Mood normal.      Procedures  Procedures  ED Course / MDM    Medical Decision Making Amount and/or Complexity of Data Reviewed Labs: ordered.  Risk Prescription drug management.   IV team unable to obtain access. Access obtained by Dr. Florie Husband at left EJ using 18G.  Labs show hemoglobin critically low at 3.7.  Transfusion initiated.  CMP shows mild hypokalemia 3.1.  40 mEq of potassium chloride  added on.  After completion of first unit of blood, patient reported he lost access from left EJ.  Peripheral line inserted to the left thigh.  Second line initiated.  Patient has remained stable with no other acute concerns at this time.  Patient is once again adamant that she will not stay for admission for continued blood administration or any other workup.  She is requesting to leave after the second unit of blood finishes.  Patient advised that she is leaving AGAINST MEDICAL ADVICE. She states she is following up with her team about her issues. She is not wanting further evaluation or admission. Encouraged patient to return for any concerns for new or worsening symptoms.    Daoud Lobue A, PA-C 11/30/23 2356    Afton Horse T, DO 12/07/23 [phone]

## 2023-11-30 NOTE — ED Provider Notes (Signed)
 [place] EMERGENCY DEPARTMENT AT [facility] Provider Note   CSN: [id number] Arrival date & time: 11/30/23  1307     History  Chief Complaint  Patient presents with   Dizziness    Mary Long is a 48 y.o. female past medical history significant for anemia, drug use, left eye blindness, right AKA, and heart failure presents today for anemia.  Patient was informed that her hemoglobin was 2.4 lower.  Patient has been seen in the past for similar.  Patient endorses shortness of breath on exertion, generalized fatigue, and dizziness.  Patient denies fever, chest pain, chills, nausea, vomiting, diarrhea, any other complaints at this time.   Dizziness Associated symptoms: shortness of breath        Home Medications Prior to Admission medications   Medication Sig Start Date End Date Taking? Authorizing Provider  acetaminophen  (TYLENOL ) 500 MG tablet Take 1,000-1,500 mg by mouth as needed for moderate pain.    [provider]  ALPRAZolam  (XANAX ) 1 MG tablet Take 1 mg by mouth 2 (two) times daily as needed for anxiety or sleep.    [provider]  methadone  (DOLOPHINE ) 10 MG/ML solution Take 80 mg by mouth daily.    [provider]  oxyCODONE  (OXY IR/ROXICODONE ) 5 MG immediate release tablet Take 1 tablet (5 mg total) by mouth every 4 (four) hours as needed for severe pain. Patient not taking: Reported on 04/13/2023 10/23/22   Timothy Ford, MD      Allergies    Patient has no known allergies.    Review of Systems   Review of Systems  Constitutional:  Positive for fever.  Respiratory:  Positive for shortness of breath.   Neurological:  Positive for dizziness.    Physical Exam Updated Vital Signs BP (!) 120/114   Pulse 82   Temp 98.2 F (36.8 C)   Resp 18   Wt 31.8 kg   LMP  (LMP Unknown) Comment: more than a year ago  SpO2 92%   BMI 13.67 kg/m  Physical Exam Vitals and nursing note reviewed.  Constitutional:      General:  She is not in acute distress.    Appearance: She is well-developed.  HENT:     Head: Normocephalic and atraumatic.     Nose: Nose normal.     Mouth/Throat:     Mouth: Mucous membranes are moist.  Eyes:     Extraocular Movements: Extraocular movements intact.     Conjunctiva/sclera: Conjunctivae normal.  Cardiovascular:     Rate and Rhythm: Normal rate and regular rhythm.     Pulses: Normal pulses.     Heart sounds: No murmur heard. Pulmonary:     Effort: Pulmonary effort is normal. No respiratory distress.     Breath sounds: Normal breath sounds.  Abdominal:     Palpations: Abdomen is soft.     Tenderness: There is no abdominal tenderness.  Musculoskeletal:        General: No swelling.     Cervical back: Neck supple.     Comments: Right AKA noted, +1 pitting edema on left lower extremity.  Patient neurovascularly intact.  Skin:    General: Skin is warm and dry.     Capillary Refill: Capillary refill takes less than 2 seconds.     Coloration: Skin is pale.     Comments: Patient with bilateral arm sleeves covering chronic wounds.  Wounds appear without infection, no purulent discharge or warmth on exam.  Neurological:  General: No focal deficit present.     Mental Status: She is alert.  Psychiatric:        Mood and Affect: Mood normal.     ED Results / Procedures / Treatments   Labs (all labs ordered are listed, but only abnormal results are displayed) Labs Reviewed  COMPREHENSIVE METABOLIC PANEL WITH GFR  CBC  TYPE AND SCREEN    EKG None  Radiology No results found.  Procedures Procedures    Medications Ordered in ED Medications - No data to display  ED Course/ Medical Decision Making/ A&P                                 Medical Decision Making Amount and/or Complexity of Data Reviewed Labs: ordered.   This patient presents to the ED for concern of anemia differential diagnosis includes anemia    Additional history obtained:  External  records from outside source obtained and reviewed including Care Everywhere  Patient signed out to [facility] PA-C at shift change pending labs, transfusion and likely AMA for refusal for admission.  Please refer to their note for complete clinical course.         Final Clinical Impression(s) / ED Diagnoses Final diagnoses:  None    Rx / DC Orders ED Discharge Orders     None         Carie Charity, PA-C 11/30/23 1505    Iva Mariner, MD 11/30/23 [phone]

## 2023-11-30 NOTE — Progress Notes (Signed)
 [facility] consult for PIV. Arrived to pt room. Visual assessment performed. This patient has open wound to bilateral arms. No appropriate place for PIV placement. RN notified. Theophilus Fitz, RN VAST

## 2023-11-30 NOTE — ED Notes (Addendum)
 Upon rounding on Pt d/t IV pump beeping while receiving blood, Pt found with a syringe in her hand and shooting up some type of drug up in her arm.Several other syringes were also noted to be in her pocketbook which was in the bed with her. Pt states that she dissolves her prescribed Dilaudid  pills in water and then draws them up into syringes and injects them into herself to control her pain.PSO called and empty syringe was tossed down sharps container. Other syringes remained in her purse but purse was taken from bedside and placed on bedside chair.

## 2023-12-01 LAB — BPAM RBC
Blood Product Expiration Date: 202506172359
Blood Product Expiration Date: 202506172359
ISSUE DATE / TIME: 202505191822
ISSUE DATE / TIME: 202505192156
Unit Type and Rh: 6200
Unit Type and Rh: 6200

## 2023-12-01 LAB — TYPE AND SCREEN
ABO/RH(D): A POS
Antibody Screen: NEGATIVE
Unit division: 0
Unit division: 0

## 2023-12-01 NOTE — ED Notes (Signed)
 Blood not completely finish but Pt now states that her PIV access is burning and painful while infusing so blood being stopped at this time. Pt refusing to be stuck again to gain access to finish remaining 2 unit of blood. Provider Oscar A Zelaya, PA-C made aware and Pt states that she just wants to leave AMA.

## 2023-12-01 NOTE — ED Notes (Signed)
 Patient found by primary nurse with personal syringe injecting something. Patient stating she dissolves her prescribed pain medication in water and injects it. Advised on hospital policy and syringe properly disposed of.

## 2023-12-21 ENCOUNTER — Encounter (HOSPITAL_BASED_OUTPATIENT_CLINIC_OR_DEPARTMENT_OTHER): Attending: General Surgery | Admitting: General Surgery

## 2024-03-14 DEATH — deceased
